# Patient Record
Sex: Female | Born: 1955 | Hispanic: No | Marital: Married | Smoking: Never smoker
Health system: Southern US, Community
[De-identification: ages and names within clinical notes are randomized; demographics above are authoritative.]

## PROBLEM LIST (undated history)

## (undated) DIAGNOSIS — E7800 Pure hypercholesterolemia, unspecified: Secondary | ICD-10-CM

## (undated) DIAGNOSIS — I10 Essential (primary) hypertension: Secondary | ICD-10-CM

## (undated) DIAGNOSIS — M1711 Unilateral primary osteoarthritis, right knee: Secondary | ICD-10-CM

## (undated) DIAGNOSIS — K5090 Crohn's disease, unspecified, without complications: Secondary | ICD-10-CM

## (undated) DIAGNOSIS — M1710 Unilateral primary osteoarthritis, unspecified knee: Secondary | ICD-10-CM

## (undated) DIAGNOSIS — K219 Gastro-esophageal reflux disease without esophagitis: Secondary | ICD-10-CM

## (undated) DIAGNOSIS — J302 Other seasonal allergic rhinitis: Secondary | ICD-10-CM

## (undated) DIAGNOSIS — Z98811 Dental restoration status: Secondary | ICD-10-CM

## (undated) DIAGNOSIS — Z8719 Personal history of other diseases of the digestive system: Secondary | ICD-10-CM

## (undated) HISTORY — PX: TONSILLECTOMY AND ADENOIDECTOMY: SUR1326

## (undated) HISTORY — PX: APPENDECTOMY: SHX54

## (undated) HISTORY — PX: CHOLECYSTECTOMY: SHX55

## (undated) HISTORY — PX: WISDOM TOOTH EXTRACTION: SHX21

## (undated) HISTORY — PX: JOINT REPLACEMENT: SHX530

## (undated) HISTORY — PX: ESOPHAGOGASTRODUODENOSCOPY (EGD) WITH ESOPHAGEAL DILATION: SHX5812

## (undated) HISTORY — PX: TUBAL LIGATION: SHX77

## (undated) HISTORY — PX: SHOULDER SURGERY: SHX246

## (undated) HISTORY — DX: Essential (primary) hypertension: I10

## (undated) HISTORY — DX: Gastro-esophageal reflux disease without esophagitis: K21.9

## (undated) HISTORY — PX: COLONOSCOPY: SHX174

---

## 2004-08-07 ENCOUNTER — Encounter: Payer: Self-pay | Admitting: Gastroenterology

## 2006-03-13 DIAGNOSIS — K5790 Diverticulosis of intestine, part unspecified, without perforation or abscess without bleeding: Secondary | ICD-10-CM | POA: Insufficient documentation

## 2006-03-13 DIAGNOSIS — J302 Other seasonal allergic rhinitis: Secondary | ICD-10-CM | POA: Insufficient documentation

## 2006-03-13 DIAGNOSIS — K219 Gastro-esophageal reflux disease without esophagitis: Secondary | ICD-10-CM | POA: Insufficient documentation

## 2006-03-13 DIAGNOSIS — N6019 Diffuse cystic mastopathy of unspecified breast: Secondary | ICD-10-CM | POA: Insufficient documentation

## 2008-02-01 ENCOUNTER — Encounter: Payer: Self-pay | Admitting: Gastroenterology

## 2008-02-19 ENCOUNTER — Encounter: Payer: Self-pay | Admitting: Gastroenterology

## 2008-03-15 ENCOUNTER — Encounter: Payer: Self-pay | Admitting: Gastroenterology

## 2009-05-24 ENCOUNTER — Encounter: Payer: Self-pay | Admitting: Gastroenterology

## 2009-10-05 ENCOUNTER — Encounter (INDEPENDENT_AMBULATORY_CARE_PROVIDER_SITE_OTHER): Payer: Self-pay | Admitting: *Deleted

## 2009-11-14 ENCOUNTER — Ambulatory Visit: Payer: Self-pay | Admitting: Gastroenterology

## 2009-11-22 ENCOUNTER — Encounter (INDEPENDENT_AMBULATORY_CARE_PROVIDER_SITE_OTHER): Payer: Self-pay | Admitting: *Deleted

## 2009-11-22 DIAGNOSIS — R1319 Other dysphagia: Secondary | ICD-10-CM | POA: Insufficient documentation

## 2009-12-08 ENCOUNTER — Encounter (INDEPENDENT_AMBULATORY_CARE_PROVIDER_SITE_OTHER): Payer: Self-pay

## 2009-12-12 ENCOUNTER — Ambulatory Visit: Payer: Self-pay | Admitting: Gastroenterology

## 2009-12-19 ENCOUNTER — Encounter (INDEPENDENT_AMBULATORY_CARE_PROVIDER_SITE_OTHER): Payer: Self-pay | Admitting: *Deleted

## 2009-12-19 ENCOUNTER — Ambulatory Visit: Payer: Self-pay | Admitting: Gastroenterology

## 2009-12-25 ENCOUNTER — Encounter: Admission: RE | Admit: 2009-12-25 | Discharge: 2010-01-11 | Payer: Self-pay | Admitting: Family Medicine

## 2010-01-23 ENCOUNTER — Ambulatory Visit: Payer: Self-pay | Admitting: Gastroenterology

## 2010-04-02 ENCOUNTER — Encounter: Payer: Self-pay | Admitting: Gastroenterology

## 2010-05-15 NOTE — Letter (Signed)
Summary: [facility]  [facility]   Imported By: Phillis Knack 11/20/2009 14:20:08  _____________________________________________________________________  External Attachment:    Type:   Image     Comment:   External Document

## 2010-05-15 NOTE — Letter (Signed)
Summary: [facility]   Imported By: Phillis Knack 11/24/2009 09:02:35  _____________________________________________________________________  External Attachment:    Type:   Image     Comment:   External Document

## 2010-05-15 NOTE — Letter (Signed)
Summary: Appt Reminder [facility]  [place], [place] [address]   Phone: [phone]  Fax: [phone]        December 19, 2009 MRN: [id number]    [facility] [address] [place], [place]  [address]    Dear Ms. Carnathan,   You have a return appointment with Dr. Ardis Hughs on 01/23/10 at 2:45 pm.  Please remember to bring a complete list of the medicines you are taking, your insurance card and your co-pay.  If you have to cancel or reschedule this appointment, please call before 5:00 pm the evening before to avoid a cancellation fee.  If you have any questions or concerns, please call [phone].    Sincerely,    Christian Mate CMA ([place])  Appended Document: Appt Reminder 2 letter mailed

## 2010-05-15 NOTE — Letter (Signed)
Summary: [facility]   Imported By: Phillis Knack 11/24/2009 08:55:47  _____________________________________________________________________  External Attachment:    Type:   Image     Comment:   External Document

## 2010-05-15 NOTE — Procedures (Signed)
Summary: [facility]   Imported By: Phillis Knack 11/24/2009 08:57:22  _____________________________________________________________________  External Attachment:    Type:   Image     Comment:   External Document

## 2010-05-15 NOTE — Letter (Signed)
Summary: EGD Instructions  [facility]  [place], [place] [address]   Phone: [phone]  Fax: [phone]       Connie Porter    01/29/1956    MRN: [id number]       Procedure Day /Date:  Tuesday 12/19/2009     Arrival Time:  2:00 pm     Procedure Time: 3:00 pm     Location of Procedure:                    _ x _ [address] (4th Floor)    PREPARATION FOR ENDOSCOPY   On Tuesday 9/6  THE DAY OF THE PROCEDURE:  1.   No solid foods, milk or milk products are allowed after midnight the night before your procedure.  2.   Do not drink anything colored red or purple.  Avoid juices with pulp.  No orange juice.  3.  You may drink clear liquids until 1:00 pm, which is 2 hours before your procedure.                                                                                                CLEAR LIQUIDS INCLUDE: Water Jello Ice Popsicles Tea (sugar ok, no milk/cream) Powdered fruit flavored drinks Coffee (sugar ok, no milk/cream) Gatorade Juice: apple, white grape, white cranberry  Lemonade Clear bullion, consomm, broth Carbonated beverages (any kind) Strained chicken noodle soup Hard Candy   MEDICATION INSTRUCTIONS  Unless otherwise instructed, you should take regular prescription medications with a small sip of water as early as possible the morning of your procedure          OTHER INSTRUCTIONS  You will need a responsible adult at least 56 years of age to accompany you and drive you home.   This person must remain in the waiting room during your procedure.  Wear loose fitting clothing that is easily removed.  Leave jewelry and other valuables at home.  However, you may wish to bring a book to read or an iPod/MP3 player to listen to music as you wait for your procedure to start.  Remove all body piercing jewelry and leave at home.  Total time from sign-in until discharge is approximately 2-3 hours.  You should go home  directly after your procedure and rest.  You can resume normal activities the day after your procedure.  The day of your procedure you should not:   Drive   Make legal decisions   Operate machinery   Drink alcohol   Return to work  You will receive specific instructions about eating, activities and medications before you leave.    The above instructions have been reviewed and explained to me by   Cornelia Copa RN  December 12, 2009 3:09 PM     I fully understand and can verbalize these instructions _____________________________ Date _________

## 2010-05-15 NOTE — Miscellaneous (Signed)
Summary: Lec previsit  Clinical Lists Changes  Observations: Added new observation of NKA: T (12/12/2009 14:19)

## 2010-05-15 NOTE — Assessment & Plan Note (Signed)
Review of gastrointestinal problems: 1.Crohn's ileocolitis:  Workup at [facility] testing 02/2008 "pattern consistent with IBD.Marland KitchenMarland KitchenCrohn's." Colonoscopy 01/2008 showed terminal ileum and right colon ulcerations, pathology c/w acute and chronic ileitis, colitis, also HP polyps.  Colonoscopy Path report from 2006 showed HP polyps.  Office note 05/2009 documented previous C. diff colitis (treated empirically and also after toxin +).    2. Dysphasia: EGD August, 2011 found Schatzki's ring that was dilated to 20 mm, small hiatal hernia, tortuous esophagus.    History of Present Illness Visit Type: Follow-up Visit Primary GI MD: Owens Loffler MD Primary Provider: Jae Dire. Modena Morrow, MD  Requesting Provider: Jae Dire. Modena Morrow, MD  Chief Complaint: Crohn's History of Present Illness:     very pleasant 55 year old woman whom I last saw about 2 months ago. She has been doing very well from UGI perspective since starting prilosec.    crohn's can cause abd pains and also loose stools at times.  She will have 3 loose stools a day, never solid.  Sometime has burning at her anus.  Normal Bowels for her is once a day only. Sometimes more that 3  adya,  stools are almost alwasy in the AM.  her weight fluctuates.  At its worst, at beginning of crohn's she had nearly non-stop diarrhea.  Since starting the asacol she has not had any severe, terrible nights.           Current Medications (verified): 1)  Singulair 10 Mg Tabs (Montelukast Sodium) .Marland Kitchen.. 1 By Mouth Once Daily 2)  Lisinopril-Hydrochlorothiazide 10-12.5 Mg Tabs (Lisinopril-Hydrochlorothiazide) .Marland Kitchen.. 1 By Mouth Once Daily 3)  Fexofenadine Hcl 60 Mg Tabs (Fexofenadine Hcl) .Marland Kitchen.. 1 By Mouth Two Times A Day 4)  Veramyst 27.5 Mcg/spray Susp (Fluticasone Furoate) .... 2 Sprays Each Nostril Once Daily 5)  Asacol 400 Mg Tbec (Mesalamine) .Marland Kitchen.. 1200 Mg By Mouth Two Times A Day 6)  Fish Oil 1200 Mg Caps (Omega-3 Fatty Acids) .Marland Kitchen.. 1 By  Mouth Two Times A Day 7)  Prilosec 20 Mg Cpdr (Omeprazole) .Marland Kitchen.. 1 By Mouth Once Daily 8)  Daily Combo Multi Vitamins  Tabs (Multiple Vitamins-Minerals) .Marland Kitchen.. 1 By Mouth Once Daily 9)  Caltrate 600+d Plus 600-400 Mg-Unit Tabs (Calcium Carbonate-Vit D-Min) .Marland Kitchen.. 1 By Mouth Two Times A Day 10)  Ra Soluble Fiber 500 Mg Tabs (Methylcellulose (Laxative)) .... 2 By Mouth Once Daily 11)  Xyzal 5 Mg Tabs (Levocetirizine Dihydrochloride) .... One Tablet By Mouth Once Daily 12)  [place] (Probiotic Product) .... One Tablet By Mouth Once Daily  Allergies (verified): No Known Drug Allergies  Vital Signs:  Patient profile:   55 year old female Height:      67 inches Weight:      267.50 pounds BMI:     42.05 Pulse rate:   64 / minute Pulse rhythm:   regular BP sitting:   102 / 66  (left arm) Cuff size:   large  Vitals Entered By: June McMurray Erin Deborra Medina) (January 23, 2010 2:36 PM)  Physical Exam  Additional Exam:  Constitutional: generally well appearing Psychiatric: alert and oriented times 3 Abdomen: soft, non-tender, non-distended, normal bowel sounds    Impression & Recommendations:  Problem # 1:  GERD, dysphasia improved on proton pump inhibitor and dilation. She will stay on Prilosec, but she will decrease to every other day as a trial.  Problem # 2:  Crohn's ileocolitis difficult to tell if she has ongoing active disease. She is not  very bothered by her symptoms. She does have 3-4 loose stools in the morning and I recommended we increase her mesalamine to 3 pills, 3 times a day and she'll return to see me in 3 months.  Patient Instructions: 1)  will increase asacol to three pills three times a day 2)  Return to see Dr. Ardis Hughs in 3 months. 3)  Try cutting back the omeprazole to every other day. 4)  A copy of this information will be sent to Dr. [name]. 5)  The medication list was reviewed and reconciled.  All changed / newly prescribed medications were  explained.  A complete medication list was provided to the patient / caregiver. Prescriptions: ASACOL 400 MG TBEC (MESALAMINE) take 3 pills, three times a day  #270 x 33   Entered and Authorized by:   Milus Banister MD   Signed by:   Milus Banister MD on 01/23/2010   Method used:   Electronically to        [facility]. [address] [phone]* (retail)       [address]       [place], [place]  [address]       Ph: [phone] or [phone]       Fax: [phone]   RxID:   [id number]

## 2010-05-15 NOTE — Letter (Signed)
Summary: Previsit letter  [facility]  [place], [place] [address]   Phone: [phone]  Fax: [phone]       11/22/2009 MRN: [id number]  Connie Porter [place], [place]  [address]  Dear [name],  Welcome to the Gastroenterology Division at [facility].    You are scheduled to see a nurse for your pre-procedure visit on 12/12/09 at 2:30pm on the 3rd floor at [facility] [place] [employer].  We ask that you try to arrive at our office 15 minutes prior to your appointment time to allow for check-in.  Your nurse visit will consist of discussing your medical and surgical history, your immediate family medical history, and your medications.    Please bring a complete list of all your medications or, if you prefer, bring the medication bottles and we will list them.  We will need to be aware of both prescribed and over the counter drugs.  We will need to know exact dosage information as well.  If you are on blood thinners (Coumadin, Plavix, Aggrenox, Ticlid, etc.) please call our office today/prior to your appointment, as we need to consult with your physician about holding your medication.   Please be prepared to read and sign documents such as consent forms, a financial agreement, and acknowledgement forms.  If necessary, and with your consent, a friend or relative is welcome to sit-in on the nurse visit with you.  Please bring your insurance card so that we may make a copy of it.  If your insurance requires a referral to see a specialist, please bring your referral form from your primary care physician.  No co-pay is required for this nurse visit.     If you cannot keep your appointment, please call [phone] to cancel or reschedule prior to your appointment date.  This allows [place] the opportunity to schedule an appointment for another patient in need of care.    Thank you for choosing [facility] for your medical  needs.  We appreciate the opportunity to care for you.  Please visit [place] at our website  to learn more about our practice.                     Sincerely.                                                                                                                   The Gastroenterology Division  Appended Document: Previsit letter letter mailed

## 2010-05-15 NOTE — Procedures (Signed)
Summary: Upper Endoscopy  Patient: Connie Porter Note: All result statuses are Final unless otherwise noted.  Tests: (1) Upper Endoscopy (EGD)   EGD Upper Endoscopy       [facility].     [place], [place]  [address]           ENDOSCOPY PROCEDURE REPORT           PATIENT:  Connie Porter, Connie Porter  MR#:  [id number]     BIRTHDATE:  06/09/1955, 26 yrs. old  GENDER:  female           ENDOSCOPIST:  Milus Banister, MD     Referred by:  [name], M.D.           PROCEDURE DATE:  12/19/2009     PROCEDURE:  EGD with balloon dilatation     ASA CLASS:  Class II     INDICATIONS:  intermittent dysphagia, chronic GERD           MEDICATIONS:  Fentanyl 75 mcg IV, Versed 6 mg IV     TOPICAL ANESTHETIC:  Exactacain Spray           DESCRIPTION OF PROCEDURE:   After the risks benefits and     alternatives of the procedure were thoroughly explained, informed     consent was obtained.  The LB GIF-H180 [id number] endoscope was     introduced through the mouth and advanced to the second portion of     the duodenum, without limitations.  The instrument was slowly     withdrawn as the mucosa was fully examined.     <<PROCEDUREIMAGES>>           There was a Schatzki's ring above a 3-4cm hiatal hernia. The ring     was dilated using a 29m CRE TTS balloon inflated for 60 seconds     with the usual resultant minor mucosal tear and self limited     oozing of blood. The distal esophagus was also quite tortuous (see     image1, image6, image5, image7, and image9).  Otherwise the     examination was normal (see image4, image3, and image2).     Retroflexed views revealed no abnormalities.    The scope was then     withdrawn from the patient and the procedure completed.           COMPLICATIONS:  None           ENDOSCOPIC IMPRESSION:     1) Schatzki's ring, dilated to 250mwith CRE balloon     2) Tortuous distal esophagus     3) 3-4cm hiatal hernia     2) Otherwise normal  examination; dysphagia probably     multifactorial (ring, tortuous esophagus, hiatal hernia and GERD).           RECOMMENDATIONS:     Continue once daily PPI.     Dr. JaArdis Hughsoffice will call to arrange follow up visit in 4-5     weeks to discuss Crohn's disease, consider further treatment.           ______________________________     DaMilus BanisterMD           n.     eSIGNED:   DaMilus Banistert 12/19/2009 11:37 AM           HoOkey [id number]: An exclamation mark (!) indicates a result  that was not dispersed into the flowsheet. Document Creation Date: 12/19/2009 11:38 AM _______________________________________________________________________  (1) Order result status: Final Collection or observation date-time: 12/19/2009 11:28 Requested date-time:  Receipt date-time:  Reported date-time:  Referring Physician:   Ordering Physician: Owens Loffler [phone]) Specimen Source:  Source: Tawanna Cooler Order Number: [id number] Lab site:   Appended Document: Upper Endoscopy ROV scheduled letter mailed

## 2010-05-15 NOTE — Letter (Signed)
Summary: New Patient letter  [facility]  [address] [place], [place] [address]   Phone: [phone]  Fax: [phone]       10/05/2009 MRN: [id number]  Connie Porter [place],   [address]  Dear Ms. Torrance,  Welcome to the Gastroenterology Division at [facility].    You are scheduled to see Dr.  Ardis Hughs on 11-14-09 at 9:00a.m. on the 3rd floor at [facility] [place] [employer].  We ask that you try to arrive at our office 15 minutes prior to your appointment time to allow for check-in.  We would like you to complete the enclosed self-administered evaluation form prior to your visit and bring it with you on the day of your appointment.  We will review it with you.  Also, please bring a complete list of all your medications or, if you prefer, bring the medication bottles and we will list them.  Please bring your insurance card so that we may make a copy of it.  If your insurance requires a referral to see a specialist, please bring your referral form from your primary care physician.  Co-payments are due at the time of your visit and may be paid by cash, check or credit card.     Your office visit will consist of a consult with your physician (includes a physical exam), any laboratory testing he/she may order, scheduling of any necessary diagnostic testing (e.g. x-ray, ultrasound, CT-scan), and scheduling of a procedure (e.g. Endoscopy, Colonoscopy) if required.  Please allow enough time on your schedule to allow for any/all of these possibilities.    If you cannot keep your appointment, please call [phone] to cancel or reschedule prior to your appointment date.  This allows [place] the opportunity to schedule an appointment for another patient in need of care.  If you do not cancel or reschedule by 5 p.m. the business day prior to your appointment date, you will be charged a $50.00 late cancellation/no-show fee.    Thank you for choosing  [facility] for your medical needs.  We appreciate the opportunity to care for you.  Please visit [place] at our website  to learn more about our practice.                     Sincerely,                                                             The Gastroenterology Division

## 2010-05-15 NOTE — Assessment & Plan Note (Signed)
History of Present Illness Visit Type: consult  Primary GI MD: Owens Loffler MD Primary Provider: Tammy R. Modena Morrow, MD  Requesting Provider: Jae Dire. Modena Morrow, MD  Chief Complaint: Crohn's History of Present Illness:     very pleasant 55 year old woman who moved from [place] a few months ago.  Used to see a GI MD regularly.  She has crohn's disease...she was having severe intermittent abd pain, vomiting.  Eventually had colonoscopy, several other tests (SBFT and labs).  All this pointed towards crohn's disease. She has been on asacol, 6 pills a day, for at least a year.  This has helped, she has not had any more "attacks" of pain/vomiting/diarrhea.  She would have these a bout 5-6 times a year...lasting about a day.  Between the attacks she would have continued bilateral lower abd pains, but no diarrhea.  Normally has 2 soft BMs a day.  She had colon polyps removed in the past, "pre-cancerous".  Her last colonoscopy was about a year ago.  also recently odynophagia, dysphagia.  She had testing in [place] about this, was told a hiatal hernia and that was the cause of her problems.  Lately her symptoms are about 2 times a week...has to either vomit or wait through the.  She never had an EGD.  Overall stable weight. She has no overt hematemesis.  She takes prilosec rarely, with pyrosis only.  About 1 every 2-3 weeks.  Tums about once a month.           Current Medications (verified): 1)  Singulair 10 Mg Tabs (Montelukast Sodium) .Marland Kitchen.. 1 By Mouth Once Daily 2)  Lisinopril-Hydrochlorothiazide 10-12.5 Mg Tabs (Lisinopril-Hydrochlorothiazide) .Marland Kitchen.. 1 By Mouth Once Daily 3)  Fexofenadine Hcl 60 Mg Tabs (Fexofenadine Hcl) .Marland Kitchen.. 1 By Mouth Two Times A Day 4)  Veramyst 27.5 Mcg/spray Susp (Fluticasone Furoate) .... 2 Sprays Each Nostril Once Daily 5)  Asacol 400 Mg Tbec (Mesalamine) .Marland Kitchen.. 1200 Mg By Mouth Two Times A Day 6)  Fish Oil 1200 Mg Caps (Omega-3 Fatty Acids) .Marland Kitchen.. 1 By Mouth Two Times A  Day 7)  Prilosec 20 Mg Cpdr (Omeprazole) .Marland Kitchen.. 1 By Mouth Once Daily 8)  Daily Combo Multi Vitamins  Tabs (Multiple Vitamins-Minerals) .Marland Kitchen.. 1 By Mouth Once Daily 9)  Caltrate 600+d Plus 600-400 Mg-Unit Tabs (Calcium Carbonate-Vit D-Min) .Marland Kitchen.. 1 By Mouth Two Times A Day 10)  Ra Soluble Fiber 500 Mg Tabs (Methylcellulose (Laxative)) .... 2 By Mouth Once Daily 11)  Xyzal 5 Mg Tabs (Levocetirizine Dihydrochloride) .... One Tablet By Mouth Once Daily 12)  [place] (Probiotic Product) .... 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  All other review of systems was otherwise negative.   Vital Signs:  Patient profile:   55 year old female Height:      67 inches Weight:  272 pounds BMI:     42.76 BSA:     2.31 Pulse rate:   96 / minute Pulse rhythm:   regular BP sitting:   128 / 76  (left arm) Cuff size:   regular  Vitals Entered By: Hope Pigeon CMA (November 14, 2009 9:00 AM)  Physical Exam  Additional Exam:  Constitutional: generally well appearing Psychiatric: alert and oriented times 3 Eyes: extraocular movements intact Mouth: oropharynx moist, no lesions Neck: supple, no lymphadenopathy Cardiovascular: heart regular rate and rythm Lungs: CTA bilaterally Abdomen: soft, non-tender, non-distended, no obvious  ascites, no peritoneal signs, normal bowel sounds Extremities: no lower extremity edema bilaterally Skin: no lesions on visible extremities    Impression & Recommendations:  Problem # 1:  History of Crohn's disease we will get records sent over from her previous gastroenterologist. Her clinical presentation was a bit unusual for classic Crohn's disease and so I am certainly interested to see her colonoscopy reports, small bowel follow-through records, serologic testing if any was done. She may need a repeat colonoscopy. She was told she would need one every one year however that is a very unusual recommendation and I would not schedule her for a repeat colonoscopy and so I can review her previous records to decipher myself whether she needs one.  I have called her in a new prescription for asacol, at her previous dosing.  Problem # 2:  dysphasia possibly GERD related. I have recommended she increase her proton pump inhibitor so that she takes it every day instead of on an as-needed basis only. She will need an EGD since she has not had one before and is having this dysphasia symptom. I would like to wait until and decide whether she also needs a colonoscopy so that those 2 tests can be done in the same setting.  Patient Instructions: 1)  We will get records from previous GI MD in [place] regarding your history of crohn's. 2)  Stay on the asacol for now, a new script was called in to pharmacy today. 3)  Start taking prilosec every day for now.  Best 20-30 min  before BF meal. 4)  You will need EGD, but may also need a colonoscopy (depending on review of previous records). We will wait these until after the records are review. 5)  A copy of this information will be sent to Dr. Cannon Kettle. 6)  The medication list was reviewed and reconciled.  All changed / newly prescribed medications were explained.  A complete medication list was provided to the patient / caregiver. Prescriptions: ASACOL 400  MG TBEC (MESALAMINE) 1200 mg by mouth two times a day  #540 x 3   Entered and Authorized by:   Milus Banister MD   Signed by:   Milus Banister MD on 11/14/2009   Method used:   Electronically to        [facility]. [address] [phone]* (retail)       [address]       [place], [place]  [address]       Ph: [phone] or [phone]       Fax: [phone]   RxID:   [id number]   Appended Document:  recieved very helpful packet of records fromprevious GI in [place].  [facility] IBD Serology 7 testing 02/2008 "pattern consistent with IBD.Marland KitchenMarland KitchenCrohn's." Colonoscopy 01/2008 showed terminal ileum and right colon ulcerations, pathology c/w acute and chronic  ileitis, colitis, also HP polyps.  Colonoscopy Path report from 2006 showed HP polyps.  Office note 05/2009 documented previous C. diff colitis (treated empirically and also after toxin +).  patty, please call patient, no need for colonoscopy at this point, but she does need EGD for the dysphagia, odynophagia  Appended Document: problems update pt aware egd and previsit scheduled   Clinical Lists Changes  Problems: Added new problem of OTHER DYSPHAGIA (HSV-074.60)

## 2010-05-17 NOTE — Letter (Signed)
Summary: Office Visit Letter  [facility]  [address] [place], [place] [address]   Phone: [phone]  Fax: [phone]      April 02, 2010 MRN: [id number]   [facility] [address] [place], [place]  [address]   Dear Ms. Sibley,   According to our records, it is time for you to schedule a follow-up office visit with [place].   At your convenience, please call [phone] (option #2)to schedule an office visit. If you have any questions, concerns, or feel that this letter is in error, we would appreciate your call.   Sincerely,  Milus Banister, M.D.  [facility] [phone]

## 2010-06-18 ENCOUNTER — Encounter: Payer: Self-pay | Admitting: Gastroenterology

## 2010-06-18 ENCOUNTER — Ambulatory Visit (INDEPENDENT_AMBULATORY_CARE_PROVIDER_SITE_OTHER): Payer: BC Managed Care – PPO | Admitting: Gastroenterology

## 2010-06-18 DIAGNOSIS — K5090 Crohn's disease, unspecified, without complications: Secondary | ICD-10-CM

## 2010-06-26 NOTE — Assessment & Plan Note (Signed)
  Review of gastrointestinal problems: 1.Crohn's ileocolitis:  Workup at [facility] IBD Serology 7 testing 02/2008 "pattern consistent with IBD.Marland KitchenMarland KitchenCrohn's." Colonoscopy 01/2008 showed terminal ileum and right colon ulcerations, pathology c/w acute and chronic ileitis, colitis, also HP polyps.  Colonoscopy Path report from 2006 showed HP polyps.  Office note 05/2009 documented previous C. diff colitis (treated empirically and also after toxin +).    2. Dysphasia: EGD August, 2011 found Schatzki's ring that was dilated to 20 mm, small hiatal hernia, tortuous esophagus.  Swallowing much improved.  March 2012 PPI only every other day.    Vital Signs:  Patient profile:   55 year old female Height:      67 inches Weight:      277.38 pounds BMI:     43.60 Pulse rate:   84 / minute Pulse rhythm:   regular BP sitting:   100 / 70  (left arm) Cuff size:   large  Vitals Entered By: June McMurray Starbuck Deborra Medina) (June 18, 2010 3:39 PM)  History of Present Illness Visit Type: Follow-up Visit Primary GI MD: Owens Loffler MD Primary Provider: Jae Dire. Modena Morrow, MD  Requesting Provider: Jae Dire. Modena Morrow, MD  Chief Complaint: Crohn's History of Present Illness:     she increased asacol up to 3.6 grams a day total.  Usually has BM, loose 2 of them in AM.  Rarely 3-4 loose stools, never nocturnal.  This is clearly better for her.  Asacol has helped.    she is very happy with her bowels now.          Current Medications (verified): 1)  Singulair 10 Mg Tabs (Montelukast Sodium) .Marland Kitchen.. 1 By Mouth Once Daily 2)  Lisinopril-Hydrochlorothiazide 10-12.5 Mg Tabs (Lisinopril-Hydrochlorothiazide) .Marland Kitchen.. 1 By Mouth Once Daily 3)  Fexofenadine Hcl 60 Mg Tabs (Fexofenadine Hcl) .Marland Kitchen.. 1 By Mouth Two Times A Day 4)  Veramyst 27.5 Mcg/spray Susp (Fluticasone Furoate) .... 2 Sprays Each Nostril Once Daily 5)  Asacol 400 Mg Tbec (Mesalamine) .... Take 3 Pills, Three Times A Day 6)  Fish Oil 1200 Mg Caps (Omega-3  Fatty Acids) .Marland Kitchen.. 1 By Mouth Two Times A Day 7)  Prilosec 20 Mg Cpdr (Omeprazole) .Marland Kitchen.. 1 By Mouth Once Daily 8)  Daily Combo Multi Vitamins  Tabs (Multiple Vitamins-Minerals) .Marland Kitchen.. 1 By Mouth Once Daily 9)  Caltrate 600+d Plus 600-400 Mg-Unit Tabs (Calcium Carbonate-Vit D-Min) .Marland Kitchen.. 1 By Mouth Two Times A Day 10)  Ra Soluble Fiber 500 Mg Tabs (Methylcellulose (Laxative)) .... 2 By Mouth Once Daily 11)  Xyzal 5 Mg Tabs (Levocetirizine Dihydrochloride) .... One Tablet By Mouth Once Daily 12)  Tom Green (Probiotic Product) .... One Tablet By Mouth Once Daily  Allergies (verified): No Known Drug Allergies  Physical Exam  Additional Exam:  Constitutional: generally well appearing,  obese Psychiatric: alert and oriented times 3 Abdomen: soft, non-tender, non-distended, normal bowel sounds    Impression & Recommendations:  Problem # 1:   Crohn's ileocolitis  she is under good control on mesalamine , 3.6 g daily. She will continue this indefinitely. She'll return to see me in 12 months and sooner if needed.  Patient Instructions: 1)  Stay on 3 pills, three times a day asacol. 2)  Return to see Dr. Ardis Hughs in 12 months, sooner if needed. 3)  The medication list was reviewed and reconciled.  All changed / newly prescribed medications were explained.  A complete medication list was provided to the patient / caregiver.

## 2011-02-11 ENCOUNTER — Other Ambulatory Visit: Payer: Self-pay | Admitting: Gastroenterology

## 2011-05-26 ENCOUNTER — Encounter (HOSPITAL_COMMUNITY): Payer: Self-pay

## 2011-05-26 ENCOUNTER — Emergency Department (HOSPITAL_COMMUNITY): Payer: BC Managed Care – PPO

## 2011-05-26 ENCOUNTER — Observation Stay (HOSPITAL_COMMUNITY)
Admission: EM | Admit: 2011-05-26 | Discharge: 2011-05-27 | Disposition: A | Discharge status: Home / Self Care | Payer: BC Managed Care – PPO | Attending: Emergency Medicine | Admitting: Emergency Medicine

## 2011-05-26 ENCOUNTER — Other Ambulatory Visit: Payer: Self-pay

## 2011-05-26 DIAGNOSIS — I10 Essential (primary) hypertension: Secondary | ICD-10-CM | POA: Insufficient documentation

## 2011-05-26 DIAGNOSIS — R079 Chest pain, unspecified: Principal | ICD-10-CM | POA: Insufficient documentation

## 2011-05-26 LAB — URINALYSIS, ROUTINE W REFLEX MICROSCOPIC
Glucose, UA: NEGATIVE mg/dL
Ketones, ur: NEGATIVE mg/dL
Protein, ur: NEGATIVE mg/dL

## 2011-05-26 LAB — CARDIAC PANEL(CRET KIN+CKTOT+MB+TROPI): CK, MB: 1.6 ng/mL (ref 0.3–4.0)

## 2011-05-26 LAB — DIFFERENTIAL
Lymphocytes Relative: 20 % (ref 12–46)
Lymphs Abs: 1.3 10*3/uL (ref 0.7–4.0)
Monocytes Relative: 6 % (ref 3–12)
Neutro Abs: 4.8 10*3/uL (ref 1.7–7.7)
Neutrophils Relative %: 71 % (ref 43–77)

## 2011-05-26 LAB — PROTIME-INR
INR: 0.91 (ref 0.00–1.49)
Prothrombin Time: 12.5 seconds (ref 11.6–15.2)

## 2011-05-26 LAB — CBC
HCT: 39.7 % (ref 36.0–46.0)
Hemoglobin: 12.5 g/dL (ref 12.0–15.0)
RBC: 4.31 MIL/uL (ref 3.87–5.11)

## 2011-05-26 LAB — COMPREHENSIVE METABOLIC PANEL
Albumin: 3.7 g/dL (ref 3.5–5.2)
Alkaline Phosphatase: 75 U/L (ref 39–117)
BUN: 18 mg/dL (ref 6–23)
CO2: 26 mEq/L (ref 19–32)
Chloride: 102 mEq/L (ref 96–112)
GFR calc Af Amer: 73 mL/min — ABNORMAL LOW (ref >90)
GFR calc non Af Amer: 63 mL/min — ABNORMAL LOW (ref >90)
Glucose, Bld: 88 mg/dL (ref 70–99)
Potassium: 4 mEq/L (ref 3.5–5.1)
Total Bilirubin: 0.2 mg/dL — ABNORMAL LOW (ref 0.3–1.2)

## 2011-05-26 LAB — URINE MICROSCOPIC-ADD ON

## 2011-05-26 MED ORDER — NITROGLYCERIN 0.4 MG SL SUBL
0.4000 mg | SUBLINGUAL_TABLET | SUBLINGUAL | Status: DC | PRN
  Administered 2011-05-26 (×2): 0.4 mg via SUBLINGUAL

## 2011-05-26 MED ORDER — SODIUM CHLORIDE 0.9 % IV SOLN
Freq: Once | INTRAVENOUS | Status: AC
  Administered 2011-05-26: 18:00:00 via INTRAVENOUS

## 2011-05-26 MED ORDER — SODIUM CHLORIDE 0.9 % IV SOLN: INTRAVENOUS | Status: DC

## 2011-05-26 MED ORDER — ONDANSETRON HCL 4 MG/2ML IJ SOLN
4.0000 mg | Freq: Four times a day (QID) | INTRAMUSCULAR | Status: DC
  Administered 2011-05-26: 4 mg via INTRAVENOUS
  Filled 2011-05-26: qty 2

## 2011-05-26 MED ORDER — MORPHINE SULFATE 4 MG/ML IJ SOLN
4.0000 mg | INTRAMUSCULAR | Status: DC | PRN
  Administered 2011-05-26 (×2): 4 mg via INTRAVENOUS
  Filled 2011-05-26 (×2): qty 1

## 2011-05-26 MED ORDER — ASPIRIN 81 MG PO CHEW
324.0000 mg | CHEWABLE_TABLET | Freq: Once | ORAL | Status: AC
  Administered 2011-05-26: 324 mg via ORAL
  Filled 2011-05-26: qty 4

## 2011-05-26 NOTE — ED Notes (Signed)
No relief of chest pain after receiving (2) sublingual nitroglycerin. Will continue to monitor. No signs of distress at the time.

## 2011-05-26 NOTE — ED Provider Notes (Cosign Needed)
History     CSN: [id number]  Arrival date & time 05/26/11  1345   First MD Initiated Contact with Patient 05/26/11 1459      Chief Complaint  Patient presents with  . Chest Pain    (Consider location/radiation/quality/duration/timing/severity/associated sxs/prior treatment) HPI Comments: The patient is a 56 year old woman who complains of chest pain for the past couple of days. The pains come and go. She feels the pain in her left anterior chest. There is no radiation. She thought it was heartburn, and tried TUMS without relief. She is on Prilosec, as prescribed by her gastroenterologist, Dr. Ardis Hughs. The pain was worse this morning, and she therefore sought evaluation. She has a family history of heart disease, with her  father dying of an MI, one brother with coronary artery stents, and. One brother with a heart transplant.  Patient is a 56 y.o. female presenting with chest pain.  Chest Pain The chest pain began 2 days ago. Chest pain occurs intermittently. The chest pain is unchanged. Associated with: Nothing. At its most intense, the pain is at 8/10. The pain is currently at 6/10. The quality of the pain is described as pressure-like. The pain does not radiate. Exacerbated by: Nothing. Pertinent negatives for primary symptoms include no fever and no fatigue. She tried antacids and proton pump inhibitors for the symptoms. Risk factors include lack of exercise and obesity. Past medical history comments: Crohn's disease  Her family medical history is significant for CAD in family.     Past Medical History  Diagnosis Date  . Hypertension     History reviewed. No pertinent past surgical history.  History reviewed. No pertinent family history.  History  Substance Use Topics  . Smoking status: Never Smoker   . Smokeless tobacco: Not on file  . Alcohol Use: Yes    OB History    Grav Para Term Preterm Abortions TAB SAB Ect Mult Living                  Review of Systems    Constitutional: Negative.  Negative for fever, chills and fatigue.  HENT: Negative.   Eyes: Negative.   Respiratory: Negative.   Cardiovascular: Positive for chest pain.  Gastrointestinal: Negative.   Genitourinary: Negative.   Musculoskeletal: Negative.   Neurological: Negative.   Psychiatric/Behavioral: Negative.     Allergies  Review of patient's allergies indicates no known allergies.  Home Medications   Current Outpatient Rx  Name Route Sig Dispense Refill  . CALCIUM CITRATE PO Oral Take 1 tablet by mouth 2 (two) times daily.    Marland Kitchen VITAMIN D-3 PO Oral Take 1 capsule by mouth at bedtime.    Marland Kitchen VITAMIN B-12 PO Oral Take 1 tablet by mouth daily.    Marland Kitchen FLUTICASONE FUROATE 27.5 MCG/SPRAY NA SUSP Nasal Place 2 sprays into the nose daily as needed. For congestion    . IRON PO Oral Take 1 tablet by mouth daily.    Marland Kitchen LEVOCETIRIZINE DIHYDROCHLORIDE 5 MG PO TABS Oral Take 5 mg by mouth every evening.    Marland Kitchen LISINOPRIL-HYDROCHLOROTHIAZIDE 10-12.5 MG PO TABS Oral Take 1 tablet by mouth every morning.    Marland Kitchen MESALAMINE 400 MG PO TBEC Oral Take 1,200 mg by mouth 3 (three) times daily.    Marland Kitchen MONTELUKAST SODIUM 10 MG PO TABS Oral Take 10 mg by mouth at bedtime.    . ADULT MULTIVITAMIN W/MINERALS CH Oral Take 1 tablet by mouth 2 (two) times daily.    Marland Kitchen  PATANASE NA Nasal Place 1 spray into the nose daily as needed. For congestion.    Marland Kitchen FISH OIL PO Oral Take 1 capsule by mouth 2 (two) times daily.      BP 118/60  Pulse 80  Temp(Src) 98.6 F (37 C) (Oral)  Resp 18  SpO2 95%  Physical Exam  Nursing note and vitals reviewed. Constitutional: She is oriented to person, place, and time.       Obese middle-aged woman in mild distress with chest pain.  HENT:  Head: Normocephalic and atraumatic.  Right Ear: External ear normal.  Left Ear: External ear normal.  Mouth/Throat: Oropharynx is clear and moist.  Eyes: Conjunctivae and EOM are normal. Pupils are equal, round, and reactive to light.   Neck: Normal range of motion. Neck supple.  Cardiovascular: Normal rate, regular rhythm and normal heart sounds.   Pulmonary/Chest: Effort normal and breath sounds normal.  Abdominal: Soft. Bowel sounds are normal.  Musculoskeletal: Normal range of motion.  Neurological: She is alert and oriented to person, place, and time.       Sensory or motor deficit.  Skin: Skin is warm and dry.  Psychiatric: She has a normal mood and affect. Her behavior is normal.    ED Course  Procedures (including critical care time)  Labs Reviewed - No data to display No results found.  3:03 PM  Date: 05/26/2011   Rate:74  Rhythm: normal sinus rhythm  QRS Axis: left  Intervals: normal QRS:  PoorR wave progression in precordial leads suggests possible old anterior myocardial infarction.  ST/T Wave abnormalities: normal  Conduction Disutrbances:none  Narrative Interpretation: Abnormal EKG  Old EKG Reviewed: none available  3:22 PM Patient was seen and had physical examination. EKG was nonacute. Laboratory tests were ordered. Aspirin and nitroglycerin were ordered.   5:09 PM Lab workup was negative.  She still has some pain.  I advised her that she needed further evaluation.  She has a heart score of 3 by my calculation.  Will place in CDU on chest pain protocol.   1. Chest pain     Admit to CDU on chest pain protocol.       Mylinda Latina III, MD 05/26/11 [phone]

## 2011-05-26 NOTE — ED Notes (Signed)
Pt complains of chest pain, onset several days ago, thought was indegestion but no help with meds for same, this am was ok and then increased once pateitn was up and moving around today.

## 2011-05-26 NOTE — ED Notes (Signed)
Pt states 4/10 L sided chest pain. Hypotensive at the time. Remains on cardiac monitor. No signs of distress at the present.

## 2011-05-26 NOTE — ED Notes (Signed)
Pt continues to have 4/10 L sided chest pain. CDU notified. Pt medicated. Will continue to monitor.

## 2011-05-26 NOTE — ED Notes (Addendum)
Pt presents to department for evaluation of non radiating L sided chest pain. Ongoing for several days, states pain became worse last night. Nothing makes pain better. States pain 5/10 at the time, becomes worse with movement. Describes pain as constant pressure. Lung sounds clear and equal bilaterally. Respirations unlabored. Skin warm and dry. She is alert and oriented x4. No signs of distress at the time.

## 2011-05-26 NOTE — ED Notes (Signed)
Pt states 5/10 L sided chest pain. Remains on cardiac monitor. Vital signs stable. EDP at bedside to update on plan of care. Pt is conscious alert and oriented x4. No signs of distress at the time.

## 2011-05-27 ENCOUNTER — Telehealth (HOSPITAL_COMMUNITY): Payer: Self-pay

## 2011-05-27 ENCOUNTER — Other Ambulatory Visit: Payer: Self-pay

## 2011-05-27 ENCOUNTER — Other Ambulatory Visit (HOSPITAL_COMMUNITY): Payer: Self-pay | Admitting: Cardiovascular Disease

## 2011-05-27 ENCOUNTER — Observation Stay (HOSPITAL_BASED_OUTPATIENT_CLINIC_OR_DEPARTMENT_OTHER): Payer: BC Managed Care – PPO | Admitting: Radiology

## 2011-05-27 VITALS — Ht 67.0 in | Wt 252.0 lb

## 2011-05-27 DIAGNOSIS — R079 Chest pain, unspecified: Secondary | ICD-10-CM

## 2011-05-27 DIAGNOSIS — R0602 Shortness of breath: Secondary | ICD-10-CM

## 2011-05-27 LAB — POCT I-STAT TROPONIN I: Troponin i, poc: 0 ng/mL (ref 0.00–0.08)

## 2011-05-27 LAB — CARDIAC PANEL(CRET KIN+CKTOT+MB+TROPI)
CK, MB: 1.6 ng/mL (ref 0.3–4.0)
Relative Index: INVALID (ref 0.0–2.5)
Total CK: 32 U/L (ref 7–177)

## 2011-05-27 LAB — URINE CULTURE: Colony Count: 15000

## 2011-05-27 MED ORDER — MESALAMINE 400 MG PO TBEC
1200.0000 mg | DELAYED_RELEASE_TABLET | ORAL | Status: AC
  Administered 2011-05-27: 1200 mg via ORAL
  Filled 2011-05-27: qty 3

## 2011-05-27 MED ORDER — METOPROLOL TARTRATE 25 MG PO TABS
50.0000 mg | ORAL_TABLET | Freq: Once | ORAL | Status: AC
  Administered 2011-05-27: 50 mg via ORAL
  Filled 2011-05-27: qty 2

## 2011-05-27 MED ORDER — TECHNETIUM TC 99M TETROFOSMIN IV KIT
33.0000 | PACK | Freq: Once | INTRAVENOUS | Status: AC | PRN
  Administered 2011-05-27: 33 via INTRAVENOUS

## 2011-05-27 MED ORDER — REGADENOSON 0.4 MG/5ML IV SOLN
0.4000 mg | Freq: Once | INTRAVENOUS | Status: AC
  Administered 2011-05-27: 0.4 mg via INTRAVENOUS

## 2011-05-27 NOTE — Consult Note (Signed)
CARDIOLOGY CONSULT NOTE  Patient ID: Connie Porter MRN: [id number] DOB/AGE: [birth year] 57 y.o.  Admit date: 05/26/2011 Referring Physician:  Leanne Lovely Primary Physician: [name], MD, MD Primary Cardiologist:  New Reason for Consultation: Chest Pain  Active Problems:  * No active hospital problems. *    HPI:   56 yo obese female came to ER yesterday for SSCP  Started at rest sharp.  Somewhat positional.  Lasted hours.  Went to ER when it didn't get better.  In ER R/O normal ECG Was to have cardiac CT and got beta blocked.  However too heavy for study.  Discussed with patient.  Agree with need for stress testing.  Can do myovue possibly two day protocol  In our office today.  CRF;s HTN Rx by Dr Modena Morrow for years.  Mild chronic exertional dyspnea.  Sedentary.  Compliant with meds.  No previous history of cardiac problems  @ROS @ All other systems reviewed and negative except as noted above  Past Medical History  Diagnosis Date  . Hypertension     History reviewed. No pertinent family history.  History   Social History  . Marital Status: Married    Spouse Name: N/A    Number of Children: N/A  . Years of Education: N/A   Occupational History  . Not on file.   Social History Main Topics  . Smoking status: Never Smoker   . Smokeless tobacco: Not on file  . Alcohol Use: Yes  . Drug Use: No  . Sexually Active:    Other Topics Concern  . Not on file   Social History Narrative  . No narrative on file    History reviewed. No pertinent past surgical history.      . sodium chloride   Intravenous Once  . aspirin  324 mg Oral Once  . mesalamine  1,200 mg Oral To Major  . metoprolol tartrate  50 mg Oral Once  . ondansetron (ZOFRAN) IV  4 mg Intravenous Q6H      . sodium chloride      Physical Exam: Blood pressure 93/63, pulse 72, temperature 98 F (36.7 C), temperature source Oral, resp. rate 16, height 5' 7"  (1.702 m), weight 114.845 kg (253 lb 3 oz), SpO2  97.00%.  Affect appropriate Obese white femal HEENT: normal Neck supple with no adenopathy JVP normal no bruits no thyromegaly Lungs clear with no wheezing and good diaphragmatic motion Heart:  S1/S2 no murmur, no rub, gallop or click PMI normal Abdomen: benighn, BS positve, no tenderness, no AAA no bruit.  No HSM or HJR Distal pulses intact with no bruits No edema Neuro non-focal Skin warm and dry No muscular weakness  Labs:   Lab Results  Component Value Date   WBC 6.7 05/26/2011   HGB 12.5 05/26/2011   HCT 39.7 05/26/2011   MCV 92.1 05/26/2011   PLT 330 05/26/2011    Lab 05/26/11 1526  NA 136  K 4.0  CL 102  CO2 26  BUN 18  CREATININE 0.98  CALCIUM 11.0*  PROT 7.7  BILITOT 0.2*  ALKPHOS 75  ALT 14  AST 22  GLUCOSE 88   Lab Results  Component Value Date   CKTOTAL 32 05/27/2011   CKMB 1.6 05/27/2011   TROPONINI <0.30 05/27/2011       Radiology: Dg Chest Port 1 View  05/26/2011  *RADIOLOGY REPORT*  Clinical Data: Chest pain.  PORTABLE CHEST - 1 VIEW  Comparison: None  Findings: The cardiac silhouette, mediastinal and hilar  contours are within normal  limits given the AP projection.  The lungs are clear.  The bony thorax is intact.  IMPRESSION: No acute cardiopulmonary findings.  Original Report Authenticated By: P. Kalman Jewels, M.D.    EKG: 05/27/11  SR rate 65  Normal ECG   ASSESSMENT AND PLAN:  Chest Pain:  R/O normal ECG.  Have arranged myovue in our office for noon.  Patient unable to ambulate well enough due to obesity.  May need 2day protocol  Too heavy For chest CT protocol got beta blockers so cant get HR up.  [place] iv in  HTN:  Continue home meds  F/U [employer].   If myovue abnormal patient can F/U with me or Dr Martinique who is her husbands cardiologist  Signed: Jenkins Rouge 05/27/2011, 8:38 AM

## 2011-05-27 NOTE — Telephone Encounter (Signed)
The patient was here for a lexiscan myoview  today. The patient went to the ED last night and her BP was low per patient. Baseline BP here 87/65 sitting, HR 59 and BP 95/68 standing HR 71. The patient had been off lisinopril/Hctz x 24 hrs. Notified Dr. Teofilo Pod nurse of BP readings, and the patient to have BP check at Dr Teofilo Pod office tomorrow before taking the  Lisinopril. Ivan Lacher,RN.

## 2011-05-27 NOTE — ED Notes (Signed)
Pt dressing and awaiting discharge papers . She is going directly to dr Johnsie Cancel office for a stress test. Dr Johnsie Cancel has requested pt be left with iv access. Denies chest pain upon discharge

## 2011-05-27 NOTE — ED Provider Notes (Signed)
Pt here in CDU on CP protocol. CT card ordered for this morning. Pt however had a calculated BMI of 39, and she therefore does not qualify for the test. Pt already received beta blockers per protocol, and therefore she is unable to have a stress test. Discussed with Dr. Winfred Leeds, who will consult cardiology.  9:06 AM Pt seen by cardiology, scheduled for Myoview at their office around 11-12. Will monitor until then.  Renold Genta, [place] 05/27/11 [phone]

## 2011-05-27 NOTE — Progress Notes (Signed)
[place] [address] [phone]  Cardiology Nuclear Med Study  Connie Porter is a 56 y.o. female [id number] 02/22/1956   Nuclear Med Background Indication for Stress Test:  Evaluation for Ischemia and 05/26/11 [facility] ED: Chest pain, (-) enzymes History:  No previous documented CAD, and 10 yrs ago GXT: NL per patient [facility]) Cardiac Risk Factors: Family History - CAD, Hypertension and Obesity  Symptoms: Chest Pain and Tightness with/without exertion (last occurrence last night),  DOE, Fatigue and Fatigue with Exertion   Nuclear Pre-Procedure Caffeine/Decaff Intake:  None NPO After: 8:30pm   Lungs:  Clear IV 0.9% NS with Angio Cath:  20g  IV Site: L Forearm, unremarkable IV Started by:  Hospital staff  Chest Size (in):  42 Cup Size: DD  Height: 5' 7"  (1.702 m)  Weight:  252 lb (114.306 kg)  BMI:  Body mass index is 39.47 kg/(m^2). Tech Comments:  Last lisinopril/Hctz was 24 hrs ago    Nuclear Med Study 1 or 2 day study: 2 day  Stress Test Type:  Treadmill/Lexiscan  Reading MD: Loralie Champagne, MD  Order Authorizing Provider:  Jenkins Rouge, MD  Resting Radionuclide: Technetium 10mTetrofosmin  Resting Radionuclide Dose: 33 mCi  05/27/11  Stress Radionuclide:  Technetium 957metrofosmin  Stress Radionuclide Dose: 33 mCi  05/29/11           Stress Protocol Rest HR: 59 Stress HR: 99  Rest BPSK:AJGOTLX87/65, Standing 95/68 Stress BP: 107/57  Exercise Time (min): 2:00 METS: n/a   Predicted Max HR: 164 bpm % Max HR: 60.37 bpm Rate Pressure Product: 10593   Dose of Adenosine (mg):  n/a Dose of Lexiscan: 0.4 mg  Dose of Atropine (mg): n/a Dose of Dobutamine: n/a mcg/kg/min (at max HR)  Stress Test Technologist: PaIrven BaltimoreRN  Nuclear Technologist:  StCharlton AmorCNMT     Rest Procedure:  Myocardial perfusion imaging was performed at rest 45 minutes following the intravenous administration of  Technetium 9934mtrofosmin. Rest ECG: NSR with Nonspecific T wave changes  Stress Procedure:  The patient received IV Lexiscan 0.4 mg over 15-seconds with concurrent low level exercise.  Technetium 15m9mrofosmin was injected at 30-seconds while the patient continued walking.  There were no significant changes with Lexiscan. The BP sitting was low at baseline and in recovery BP 80/56 (Asymptomatic).IV 0.9% NACl 250cc given with follow-up BP 112/76. Quantitative spect images were obtained after a 45-minute delay. Stress ECG: No significant change from baseline ECG  QPS Raw Data Images:  Normal; no motion artifact; normal heart/lung ratio. Stress Images:  Normal homogeneous uptake in all areas of the myocardium. Rest Images:  Normal homogeneous uptake in all areas of the myocardium. Subtraction (SDS):  Normal Transient Ischemic Dilatation (Normal <1.22):  .94 Lung/Heart Ratio (Normal <0.45):  .28  Quantitative Gated Spect Images QGS EDV:  91 ml QGS ESV:  24 ml QGS cine images:  NL LV Function; NL Wall Motion QGS EF: 73%  Impression Exercise Capacity:  Lexiscan with low level exercise. BP Response:  Normal blood pressure response. Clinical Symptoms:  No chest pain. ECG Impression:  No significant ST segment change suggestive of ischemia. Comparison with Prior Nuclear Study: No previous nuclear study performed  Overall Impression:  Normal stress nuclear study.  PeteJenkins Rouge

## 2011-05-27 NOTE — Progress Notes (Signed)
Observation review is complete.

## 2011-05-27 NOTE — ED Notes (Signed)
Pt bmi is 20.5

## 2011-05-27 NOTE — ED Provider Notes (Signed)
Complaint of anterior chest pain onset 2 days ago pain , left-sided intermittent became worse when she got up to walk inside of her house however lasted throughout the night 2 nights ago even after she rested. She is presently asymptomatic in no distress. Patient's BMI calculated at 39. Too high for cardiac CT. Patient not felt to be a good candidate for stress echo.. Case discussed with Dr.Nishan who will come to evaluate patient in the ED. Dr. Johnsie Cancel evaluated the patient and arrange for further outpatient testing today  Orlie Dakin, MD 05/27/11 [phone]

## 2011-05-27 NOTE — ED Provider Notes (Signed)
Medical screening examination/treatment/procedure(s) were conducted as a shared visit with non-physician practitioner(s) and myself.  I personally evaluated the patient during the encounter  Orlie Dakin, MD 05/27/11 1754

## 2011-05-29 ENCOUNTER — Ambulatory Visit (HOSPITAL_COMMUNITY): Payer: BC Managed Care – PPO | Attending: Cardiovascular Disease | Admitting: Radiology

## 2011-05-29 DIAGNOSIS — R0989 Other specified symptoms and signs involving the circulatory and respiratory systems: Secondary | ICD-10-CM

## 2011-05-29 MED ORDER — TECHNETIUM TC 99M TETROFOSMIN IV KIT
33.0000 | PACK | Freq: Once | INTRAVENOUS | Status: AC | PRN
  Administered 2011-05-29: 33 via INTRAVENOUS

## 2011-11-19 ENCOUNTER — Other Ambulatory Visit: Payer: Self-pay | Admitting: Gastroenterology

## 2012-02-18 ENCOUNTER — Other Ambulatory Visit: Payer: Self-pay | Admitting: Gastroenterology

## 2012-02-19 ENCOUNTER — Telehealth: Payer: Self-pay | Admitting: Gastroenterology

## 2012-02-19 MED ORDER — MESALAMINE 400 MG PO CPDR: 1200.0000 mg | DELAYED_RELEASE_CAPSULE | Freq: Three times a day (TID) | ORAL | Status: DC

## 2012-02-19 NOTE — Telephone Encounter (Signed)
Pt aware and med has been sent to last until appt

## 2012-03-18 ENCOUNTER — Encounter: Payer: Self-pay | Admitting: Gastroenterology

## 2012-03-18 ENCOUNTER — Ambulatory Visit (INDEPENDENT_AMBULATORY_CARE_PROVIDER_SITE_OTHER): Payer: BC Managed Care – PPO | Admitting: Gastroenterology

## 2012-03-18 VITALS — BP 104/76 | HR 80 | Ht 67.0 in | Wt 267.0 lb

## 2012-03-18 DIAGNOSIS — K222 Esophageal obstruction: Secondary | ICD-10-CM

## 2012-03-18 DIAGNOSIS — R1310 Dysphagia, unspecified: Secondary | ICD-10-CM

## 2012-03-18 DIAGNOSIS — R197 Diarrhea, unspecified: Secondary | ICD-10-CM

## 2012-03-18 DIAGNOSIS — Q393 Congenital stenosis and stricture of esophagus: Secondary | ICD-10-CM

## 2012-03-18 MED ORDER — MESALAMINE 400 MG PO CPDR: 1600.0000 mg | DELAYED_RELEASE_CAPSULE | Freq: Three times a day (TID) | ORAL | Status: DC

## 2012-03-18 NOTE — Patient Instructions (Addendum)
You will be set up for an upper endoscopy for dysphagia, history of ring (LEC, moderate sedation). Increase dose of delzicol (to 4 pills three times a day). New script written. Start once daily imodium, every morning shortly after you wake.  (loperimide) Return in 4-6 weeks. You will have labs checked today in the basement lab.  Please head down after you check out with the front desk  (c. Diff by pcr). You have been scheduled for an endoscopy.   Please follow written instructions given to you at your visit today. If you use inhalers (even only as needed) or a CPAP machine, please bring them with you on the day of your procedure.

## 2012-03-18 NOTE — Progress Notes (Signed)
Review of gastrointestinal problems:  1.Crohn's ileocolitis: Workup at [facility].Marland KitchenMarland KitchenPromethius IBD Serology 7 testing 02/2008 "pattern consistent with IBD.Marland KitchenMarland KitchenCrohn's." Colonoscopy 01/2008 showed terminal ileum and right colon ulcerations, pathology c/w acute and chronic ileitis, colitis, also HP polyps. Colonoscopy Path report from 2006 showed HP polyps. Office note 05/2009 documented previous C. diff colitis (treated empirically and also after toxin +).   2012 doing well on oral mesalamine 3.6 gms/day 2. Dysphasia: EGD August, 2011 found Schatzki's ring that was dilated to 20 mm, small hiatal hernia, tortuous esophagus. Swallowing much improved. March 2012 PPI only every other day.   HPI: This is a    very pleasant 56 year old woman whom I last saw over a year ago  Has had several "flares" the past several months.  Pain, vomiting, diarrhea episodes 4 times.  The episodes last 12-18 hours and then she is absolutely fine in between.  No associated bleeding.    She is usually having 4-5 liquid stools per day, soft mushy.  Non-bloody.  This is up from her previous normal 2 bms a day.  She was on z pack for 'sinus infection' a couple months ago.  Also having solid food dysphagia.  Weight fluctuates overall.  Down 10 pounds in past 12 months, on our scale.   Past Medical History  Diagnosis Date  . Hypertension   . Crohn's ileocolitis   . C. difficile diarrhea   . Schatzki's ring   . Hiatal hernia     History reviewed. No pertinent past surgical history.  Current Outpatient Prescriptions  Medication Sig Dispense Refill  . CALCIUM CITRATE PO Take 1 tablet by mouth 2 (two) times daily.      . Cholecalciferol (VITAMIN D-3 PO) Take 1 capsule by mouth at bedtime.      . Cyanocobalamin (VITAMIN B-12 PO) Take 1 tablet by mouth daily.      Marland Kitchen levocetirizine (XYZAL) 5 MG tablet Take 5 mg by mouth every evening.      . Mesalamine (DELZICOL) 400 MG CPDR Take by mouth.      . montelukast (SINGULAIR)  10 MG tablet Take 10 mg by mouth at bedtime.      . Multiple Vitamin (MULITIVITAMIN WITH MINERALS) TABS Take 1 tablet by mouth 2 (two) times daily.      . Omega-3 Fatty Acids (FISH OIL PO) Take 1 capsule by mouth 2 (two) times daily.      . Mesalamine (DELZICOL) 400 MG CPDR Take 3 capsules (1,200 mg total) by mouth 3 (three) times daily.  270 capsule  1  . [DISCONTINUED] mesalamine (ASACOL) 400 MG EC tablet Take 1,200 mg by mouth 3 (three) times daily.        Allergies as of 03/18/2012  . (No Known Allergies)    History reviewed. No pertinent family history.  History   Social History  . Marital Status: Married    Spouse Name: N/A    Number of Children: 3  . Years of Education: N/A   Occupational History  . Retired Pharmacist, hospital    Social History Main Topics  . Smoking status: Never Smoker   . Smokeless tobacco: Never Used  . Alcohol Use: Yes  . Drug Use: No  . Sexually Active: Not on file   Other Topics Concern  . Not on file   Social History Narrative  . No narrative on file      Physical Exam: BP 104/76  Pulse 80  Ht 5' 7"  (1.702 m)  Wt 267 lb (121.11  kg)  BMI 41.82 kg/m2 Constitutional: generally well-appearing Psychiatric: alert and oriented x3 Abdomen: soft, nontender, nondistended, no obvious ascites, no peritoneal signs, normal bowel sounds     Assessment and plan: 56 y.o. female with Crohn's ileocolitis, dysphasia  First disease flareup episodes which she described are not clearly Crohn's exacerbations to me. They seem to brief. She has however noticed a definite worsening of her chronic bowel habits, she used to have 2 stools a day now she is having more like 45. She is going to increase her mesalamine to full strength, 4.8 g daily and she will return to see me in 4-6 weeks to report on her symptoms. I'm also going to have stool tested for C. difficile which she has had in the past. She has intermittent dysphasia. Known Schatzki's ring in the past and we  will set her up with repeat EGD, dilation.

## 2012-03-19 ENCOUNTER — Other Ambulatory Visit: Payer: BC Managed Care – PPO

## 2012-03-19 DIAGNOSIS — K222 Esophageal obstruction: Secondary | ICD-10-CM

## 2012-03-19 DIAGNOSIS — R197 Diarrhea, unspecified: Secondary | ICD-10-CM

## 2012-03-19 DIAGNOSIS — R1310 Dysphagia, unspecified: Secondary | ICD-10-CM

## 2012-03-27 ENCOUNTER — Encounter: Payer: Self-pay | Admitting: Gastroenterology

## 2012-03-27 ENCOUNTER — Ambulatory Visit (AMBULATORY_SURGERY_CENTER): Payer: BC Managed Care – PPO | Admitting: Gastroenterology

## 2012-03-27 VITALS — BP 127/64 | HR 63 | Temp 98.5°F | Resp 15 | Ht 67.0 in | Wt 267.0 lb

## 2012-03-27 DIAGNOSIS — K222 Esophageal obstruction: Secondary | ICD-10-CM

## 2012-03-27 DIAGNOSIS — R1310 Dysphagia, unspecified: Secondary | ICD-10-CM

## 2012-03-27 DIAGNOSIS — R197 Diarrhea, unspecified: Secondary | ICD-10-CM

## 2012-03-27 DIAGNOSIS — Q391 Atresia of esophagus with tracheo-esophageal fistula: Secondary | ICD-10-CM

## 2012-03-27 DIAGNOSIS — Q393 Congenital stenosis and stricture of esophagus: Secondary | ICD-10-CM

## 2012-03-27 MED ORDER — SODIUM CHLORIDE 0.9 % IV SOLN: 500.0000 mL | INTRAVENOUS | Status: DC

## 2012-03-27 NOTE — Op Note (Signed)
[place]  [address] [place], [address]   ENDOSCOPY PROCEDURE REPORT  PATIENT: [name], Connie Porter  MR#: [id number] BIRTHDATE: 1955-09-06 , 72  yrs. old GENDER: Female ENDOSCOPIST: Milus Banister, MD PROCEDURE DATE:  03/27/2012 PROCEDURE:  EGD, with balloon dilation ASA CLASS:     Class II INDICATIONS:  dysphgai, history of Schatzki's ring. MEDICATIONS: Fentanyl 50 mcg IV, Versed 4 mg IV, and These medications were titrated to patient response per physician's verbal order TOPICAL ANESTHETIC: Cetacaine Spray  DESCRIPTION OF PROCEDURE: After the risks benefits and alternatives of the procedure were thoroughly explained, informed consent was obtained.  The LB GIF-H180 [id number] endoscope was introduced through the mouth and advanced to the second portion of the duodenum. Without limitations.  The instrument was slowly withdrawn as the mucosa was fully examined.     There was a 4 cm hiatal hernia with resultant foreshortened and distally tortuous esophagus.  There was a focal Schatzki's ring at GE junction.  This was dilated with 67m CRE TTS balloon held inflated for 1 minute.  There was the usual minor mucosal tear and self limited oozing of blood following dilation.  The examination was otherwise normal.  Retroflexed views revealed no abnormalities. The scope was then withdrawn from the patient and the procedure completed. COMPLICATIONS: There were no complications.  ENDOSCOPIC IMPRESSION: There was a 4 cm hiatal hernia with resultant foreshortened and distally tortuous esophagus. There was a focal Schatzki's ring at GE junction, dilated today. The examination was otherwise normal.  RECOMMENDATIONS: Follow clinically.  Please call if you have a return of your swallowing difficulty.   eSigned:  DMilus Banister MD 03/27/2012 11:37 AM

## 2012-03-27 NOTE — Patient Instructions (Addendum)
Discharge instructions given with verbal understanding. Handout on a hiatal hernia given. Resume previous medications. YOU HAD AN ENDOSCOPIC PROCEDURE TODAY AT [facility]: Refer to the procedure report that was given to you for any specific questions about what was found during the examination.  If the procedure report does not answer your questions, please call your gastroenterologist to clarify.  If you requested that your care partner not be given the details of your procedure findings, then the procedure report has been included in a sealed envelope for you to review at your convenience later.  YOU SHOULD EXPECT: Some feelings of bloating in the abdomen. Passage of more gas than usual.  Walking can help get rid of the air that was put into your GI tract during the procedure and reduce the bloating. If you had a lower endoscopy (such as a colonoscopy or flexible sigmoidoscopy) you may notice spotting of blood in your stool or on the toilet paper. If you underwent a bowel prep for your procedure, then you may not have a normal bowel movement for a few days.  DIET: Your first meal following the procedure should be a light meal and then it is ok to progress to your normal diet.  A half-sandwich or bowl of soup is an example of a good first meal.  Heavy or fried foods are harder to digest and may make you feel nauseous or bloated.  Likewise meals heavy in dairy and vegetables can cause extra gas to form and this can also increase the bloating.  Drink plenty of fluids but you should avoid alcoholic beverages for 24 hours.  ACTIVITY: Your care partner should take you home directly after the procedure.  You should plan to take it easy, moving slowly for the rest of the day.  You can resume normal activity the day after the procedure however you should NOT DRIVE or use heavy machinery for 24 hours (because of the sedation medicines used during the test).    SYMPTOMS TO REPORT IMMEDIATELY: A  gastroenterologist can be reached at any hour.  During normal business hours, 8:30 AM to 5:00 PM Monday through Friday, call [phone].  After hours and on weekends, please call the GI answering service at [phone] who will take a message and have the physician on call contact you.   Following upper endoscopy (EGD)  Vomiting of blood or coffee ground material  New chest pain or pain under the shoulder blades  Painful or persistently difficult swallowing  New shortness of breath  Fever of 100F or higher  Black, tarry-looking stools  FOLLOW UP: If any biopsies were taken you will be contacted by phone or by letter within the next 1-3 weeks.  Call your gastroenterologist if you have not heard about the biopsies in 3 weeks.  Our staff will call the home number listed on your records the next business day following your procedure to check on you and address any questions or concerns that you may have at that time regarding the information given to you following your procedure. This is a courtesy call and so if there is no answer at the home number and we have not heard from you through the emergency physician on call, we will assume that you have returned to your regular daily activities without incident.  SIGNATURES/CONFIDENTIALITY: You and/or your care partner have signed paperwork which will be entered into your electronic medical record.  These signatures attest to the fact that that the information  above on your After Visit Summary has been reviewed and is understood.  Full responsibility of the confidentiality of this discharge information lies with you and/or your care-partner.

## 2012-03-27 NOTE — Progress Notes (Signed)
Patient did not experience any of the following events: a burn prior to discharge; a fall within the facility; wrong site/side/patient/procedure/implant event; or a hospital transfer or hospital admission upon discharge from the facility. (G8907) Patient did not have preoperative order for IV antibiotic SSI prophylaxis. (G8918)  

## 2012-03-30 ENCOUNTER — Telehealth: Payer: Self-pay | Admitting: *Deleted

## 2012-03-30 NOTE — Telephone Encounter (Signed)
  Follow up Call-  Call back number 03/27/2012  Post procedure Call Back phone  # [phone]  Permission to leave phone message Yes     Patient questions:  Do you have a fever, pain , or abdominal swelling? no Pain Score  0 *  Have you tolerated food without any problems? yes  Have you been able to return to your normal activities? yes  Do you have any questions about your discharge instructions: Diet   no Medications  no Follow up visit  no  Do you have questions or concerns about your Care? no  Actions: * If pain score is 4 or above: No action needed, pain <4. patient stating still some soreness in throat, reviewed signs and symptoms to report, patient denying any problems.

## 2012-04-28 ENCOUNTER — Encounter: Payer: Self-pay | Admitting: Gastroenterology

## 2012-04-28 ENCOUNTER — Ambulatory Visit (INDEPENDENT_AMBULATORY_CARE_PROVIDER_SITE_OTHER): Payer: BC Managed Care – PPO | Admitting: Gastroenterology

## 2012-04-28 VITALS — BP 136/64 | HR 90 | Ht 67.5 in | Wt 269.6 lb

## 2012-04-28 DIAGNOSIS — K5090 Crohn's disease, unspecified, without complications: Secondary | ICD-10-CM

## 2012-04-28 MED ORDER — PEG-KCL-NACL-NASULF-NA ASC-C 100 G PO SOLR: 1.0000 | Freq: Once | ORAL | Status: DC

## 2012-04-28 NOTE — Patient Instructions (Addendum)
You will be set up for a colonoscopy (LEC, moderate sedation for Crohn's disease, irregular bowels). In meantime stay on mesalamine.

## 2012-04-28 NOTE — Progress Notes (Signed)
Review of gastrointestinal problems:  1.Crohn's ileocolitis: Workup at [facility].Marland KitchenMarland KitchenPromethius IBD Serology 7 testing 02/2008 "pattern consistent with IBD.Marland KitchenMarland KitchenCrohn's." Colonoscopy 01/2008 showed terminal ileum and right colon ulcerations, pathology c/w acute and chronic ileitis, colitis, also HP polyps. Colonoscopy Path report from 2006 showed HP polyps. Office note 05/2009 documented previous C. diff colitis (treated empirically and also after toxin +). 2012 doing well on oral mesalamine 3.6 gms/day.  ? Worsening of disease 03/2013, c. Diff PCR neg; increased mesalamine to 4.8gm daily. 2. Dysphasia: EGD August, 2011 found Schatzki's ring that was dilated to 20 mm, small hiatal hernia, tortuous esophagus. Swallowing much improved. March 2012 PPI only every other day.  Repeat EGD 03/2013 for dysphagia showed 4cn HH, tortuous distal esophagus, Schatzki's ring that was dilated to 68m.   HPI: This is a   very pleasant 57year old woman whom I last saw time of EGD about a month ago.  Swallowing troubles gone.    Bowels 1-2 bms, alternating with 4-5 a day, pretty loose stools.   Diet has not really made a difference.  Takes imodium once daily.  She increased her mesalamine to full-strength about a month ago.   Past Medical History  Diagnosis Date  . Hypertension   . Crohn's ileocolitis   . C. difficile diarrhea   . Schatzki's ring   . Hiatal hernia   . Allergy   . GERD (gastroesophageal reflux disease)     Past Surgical History  Procedure Date  . Tonsillectomy and adenoidectomy   . C section x3     x 3  . Cholecystectomy   . Appendectomy   . Wisdom tooth extraction   . Colonoscopy   . Polypectomy     Current Outpatient Prescriptions  Medication Sig Dispense Refill  . CALCIUM CITRATE PO Take 1 tablet by mouth 2 (two) times daily.      . Cholecalciferol (VITAMIN D-3 PO) Take 1 capsule by mouth at bedtime.      . Cyanocobalamin (VITAMIN B-12 PO) Take 1 tablet by mouth daily.      .  fexofenadine (ALLEGRA) 180 MG tablet Take 180 mg by mouth daily.      .Marland Kitchenlevocetirizine (XYZAL) 5 MG tablet Take 5 mg by mouth every evening.      . Mesalamine (DELZICOL) 400 MG CPDR Take 4 capsules (1,600 mg total) by mouth 3 (three) times daily.  360 capsule  11  . montelukast (SINGULAIR) 10 MG tablet Take 10 mg by mouth at bedtime.      . Multiple Vitamin (MULITIVITAMIN WITH MINERALS) TABS Take 1 tablet by mouth 2 (two) times daily.      . Omega-3 Fatty Acids (FISH OIL PO) Take 1 capsule by mouth 2 (two) times daily.        Allergies as of 04/28/2012  . (No Known Allergies)    Family History  Problem Relation Age of Onset  . Heart disease Father   . Colon cancer Neg Hx   . Esophageal cancer Neg Hx   . Rectal cancer Neg Hx   . Stomach cancer Neg Hx     History   Social History  . Marital Status: Married    Spouse Name: N/A    Number of Children: 3  . Years of Education: N/A   Occupational History  . Retired TPharmacist, hospital   Social History Main Topics  . Smoking status: Former SResearch scientist (life sciences) . Smokeless tobacco: Never Used  . Alcohol Use: 0.0 oz/week    4-5 Glasses  of wine, 4-5 Drinks containing 0.5 oz of alcohol per week     Comment: pt drinks 4-5 drinks only on the weekends  . Drug Use: No  . Sexually Active: Not on file   Other Topics Concern  . Not on file   Social History Narrative  . No narrative on file      Physical Exam: BP 136/64  Pulse 90  Ht 5' 7.5" (1.715 m)  Wt 269 lb 9.6 oz (122.29 kg)  BMI 41.60 kg/m2 Constitutional: generally well-appearing Psychiatric: alert and oriented x3 Abdomen: soft, nontender, nondistended, no obvious ascites, no peritoneal signs, normal bowel sounds     Assessment and plan: 57 y.o. female with  improved dysphagia since EGD and dilation generally loose, alternating frequency loose stools.  She has a minimum of 2 BMs a day and sometimes up to 4 loose stools a day. Diet has not seemed to play a role in increasing her  mesalamine did not really help and a single Imodium every day has not helped. It made her wonder whether she is truly having Crohn's related diarrhea. Her last colonoscopy was 4-5 years ago and I recommended we repeat that now to restage her disease. Would consider immunomodulators if disease is significant.

## 2012-05-12 ENCOUNTER — Other Ambulatory Visit: Payer: Self-pay | Admitting: Gastroenterology

## 2012-05-12 ENCOUNTER — Encounter: Payer: Self-pay | Admitting: Gastroenterology

## 2012-05-12 ENCOUNTER — Ambulatory Visit (AMBULATORY_SURGERY_CENTER): Payer: BC Managed Care – PPO | Admitting: Gastroenterology

## 2012-05-12 ENCOUNTER — Other Ambulatory Visit: Payer: BC Managed Care – PPO

## 2012-05-12 ENCOUNTER — Other Ambulatory Visit: Payer: Self-pay

## 2012-05-12 VITALS — BP 108/49 | HR 66 | Temp 98.3°F | Resp 21 | Ht 67.0 in | Wt 269.0 lb

## 2012-05-12 DIAGNOSIS — K5090 Crohn's disease, unspecified, without complications: Secondary | ICD-10-CM

## 2012-05-12 DIAGNOSIS — K5010 Crohn's disease of large intestine without complications: Secondary | ICD-10-CM

## 2012-05-12 MED ORDER — SODIUM CHLORIDE 0.9 % IV SOLN: 500.0000 mL | INTRAVENOUS | Status: DC

## 2012-05-12 NOTE — Progress Notes (Addendum)
The pt was uncomfortable with the scope advancement to the cecum.  Her IV dripped slowly ans she would bend her hand slowing IV even more.  To signs of IV infiltration.  Pt was showing that she was getting medication, eyes rolling, snoring noted at times.  After the cecum was reached she relaxed and rested comfortably opening her eyes off and on speaking brief comments. Maw

## 2012-05-12 NOTE — Op Note (Signed)
[place]  [address] [place], [address]   COLONOSCOPY PROCEDURE REPORT  PATIENT: Connie Porter, Connie Porter  MR#: [id number] BIRTHDATE: 01-Jul-1955 , 64  yrs. old GENDER: Female ENDOSCOPIST: Milus Banister, MD PROCEDURE DATE:  05/12/2012 PROCEDURE:   Colonoscopy with biopsy ASA CLASS:   Class III INDICATIONS:Crohn's ileocolitis: Workup at [facility].Marland KitchenMarland KitchenPromethius IBD Serology 7 testing 02/2008 "pattern consistent with IBD.Marland KitchenMarland KitchenCrohn's." Colonoscopy 01/2008 showed terminal ileum and right colon ulcerations, pathology c/w acute and chronic ileitis, colitis, also HP polyps.  Colonoscopy Path report from 2006 showed HP polyps. Office note 05/2009 documented previous C.  diff colitis (treated empirically and also after toxin +).  2012 doing well on oral mesalamine 3.6 gms/day.  ? Worsening of disease 03/2013, c.  Diff PCR neg; increased mesalamine to 4.8gm daily. MEDICATIONS: Fentanyl 125 mcg IV, Versed 8 mg IV, and These medications were titrated to patient response per physician's verbal order  DESCRIPTION OF PROCEDURE:   After the risks benefits and alternatives of the procedure were thoroughly explained, informed consent was obtained.  A digital rectal exam revealed no abnormalities of the rectum.   The LB PCF-H180AL [id number]  endoscope was introduced through the anus and advanced to the terminal ileum which was intubated for a short distance. No adverse events experienced.   The quality of the prep was good, using MoviPrep The instrument was then slowly withdrawn as the colon was fully examined.  COLON FINDINGS: The colon was normal except for mild cecal inflammation near the IC valve and left sided diverticulosis.  The terminal ileum was moderate to severely inflamed with multiple ulcers, friable inflamed mucosa.  This was biospied and sent to pathology.  The examination was otherwise normal.  Retroflexed views revealed no abnormalities. The time to cecum=13.  minutes  29 seconds.  Withdrawal time=5 minutes 07 seconds.  The scope was withdrawn and the procedure completed. COMPLICATIONS: There were no complications.  ENDOSCOPIC IMPRESSION: The colon was normal except for mild cecal inflammation near the IC valve and left sided diverticulosis. Moderate to severe ileitis (Crohn's ileitis). Biopsied.  RECOMMENDATIONS: TPMT phenotype testing today, my office will help coordinate this bloodtest.  Once the result is back, I would like to start you on stronger class of medicine (azathiaprine).   eSigned:  Milus Banister, MD 05/12/2012 3:22 PM     PATIENT NAME:  [name], [name] MR#: [id number]

## 2012-05-12 NOTE — Progress Notes (Addendum)
Patient did not experience any of the following events: a burn prior to discharge; a fall within the facility; wrong site/side/patient/procedure/implant event; or a hospital transfer or hospital admission upon discharge from the facility. [phone]) Patient did not have preoperative order for IV antibiotic SSI prophylaxis. [phone])  ewm  Pt to go to lab after discharge from [facility] per Dr Ardis Hughs orders. Pt and care partner aware. ewm

## 2012-05-12 NOTE — Patient Instructions (Addendum)
YOU HAD AN ENDOSCOPIC PROCEDURE TODAY AT [facility]: Refer to the procedure report that was given to you for any specific questions about what was found during the examination.  If the procedure report does not answer your questions, please call your gastroenterologist to clarify.  If you requested that your care partner not be given the details of your procedure findings, then the procedure report has been included in a sealed envelope for you to review at your convenience later.  YOU SHOULD EXPECT: Some feelings of bloating in the abdomen. Passage of more gas than usual.  Walking can help get rid of the air that was put into your GI tract during the procedure and reduce the bloating. If you had a lower endoscopy (such as a colonoscopy or flexible sigmoidoscopy) you may notice spotting of blood in your stool or on the toilet paper. If you underwent a bowel prep for your procedure, then you may not have a normal bowel movement for a few days.  DIET: Your first meal following the procedure should be a light meal and then it is ok to progress to your normal diet.  A half-sandwich or bowl of soup is an example of a good first meal.  Heavy or fried foods are harder to digest and may make you feel nauseous or bloated.  Likewise meals heavy in dairy and vegetables can cause extra gas to form and this can also increase the bloating.  Drink plenty of fluids but you should avoid alcoholic beverages for 24 hours.  ACTIVITY: Your care partner should take you home directly after the procedure.  You should plan to take it easy, moving slowly for the rest of the day.  You can resume normal activity the day after the procedure however you should NOT DRIVE or use heavy machinery for 24 hours (because of the sedation medicines used during the test).    SYMPTOMS TO REPORT IMMEDIATELY: A gastroenterologist can be reached at any hour.  During normal business hours, 8:30 AM to 5:00 PM Monday through Friday,  call [phone].  After hours and on weekends, please call the GI answering service at [phone] emergency number who will take a message and have the physician on call contact you.   Following lower endoscopy (colonoscopy or flexible sigmoidoscopy):  Excessive amounts of blood in the stool  Significant tenderness or worsening of abdominal pains  Swelling of the abdomen that is new, acute  Fever of 100F or higher  FOLLOW UP: If any biopsies were taken you will be contacted by phone or by letter within the next 1-3 weeks.  Call your gastroenterologist if you have not heard about the biopsies in 3 weeks.  Our staff will call the home number listed on your records the next business day following your procedure to check on you and address any questions or concerns that you may have at that time regarding the information given to you following your procedure. This is a courtesy call and so if there is no answer at the home number and we have not heard from you through the emergency physician on call, we will assume that you have returned to your regular daily activities without incident.  SIGNATURES/CONFIDENTIALITY: You and/or your care partner have signed paperwork which will be entered into your electronic medical record.  These signatures attest to the fact that that the information above on your After Visit Summary has been reviewed and is understood.  Full responsibility of the confidentiality  of this discharge information lies with you and/or your care-partner.   Handout on diverticulosis and high fiber diet Biopsies pending Lab work to be done today, dr Kellogg office did set this up for today. Once these results are back dr Ardis Hughs would like to start you on a stronger class of medicines.

## 2012-05-13 ENCOUNTER — Telehealth: Payer: Self-pay | Admitting: *Deleted

## 2012-05-13 NOTE — Telephone Encounter (Signed)
  Follow up Call-  Call back number 05/12/2012 03/27/2012  Post procedure Call Back phone  # [phone]  Permission to leave phone message Yes Yes     Patient questions:  Do you have a fever, pain , or abdominal swelling? no Pain Score  0 *  Have you tolerated food without any problems? yes  Have you been able to return to your normal activities? yes  Do you have any questions about your discharge instructions: Diet   no Medications  no Follow up visit  no  Do you have questions or concerns about your Care? no  Actions: * If pain score is 4 or above: No action needed, pain <4.

## 2012-05-18 LAB — THIOPURINE METHYLTRANSFERASE (TPMT), RBC

## 2012-05-20 ENCOUNTER — Other Ambulatory Visit: Payer: Self-pay

## 2012-05-20 DIAGNOSIS — K5090 Crohn's disease, unspecified, without complications: Secondary | ICD-10-CM

## 2012-05-20 MED ORDER — AZATHIOPRINE 100 MG PO TABS: 200.0000 mg | ORAL_TABLET | Freq: Every day | ORAL | Status: DC

## 2012-06-03 ENCOUNTER — Telehealth: Payer: Self-pay

## 2012-06-03 ENCOUNTER — Other Ambulatory Visit (INDEPENDENT_AMBULATORY_CARE_PROVIDER_SITE_OTHER): Payer: BC Managed Care – PPO

## 2012-06-03 DIAGNOSIS — K5090 Crohn's disease, unspecified, without complications: Secondary | ICD-10-CM

## 2012-06-03 LAB — CBC WITH DIFFERENTIAL/PLATELET
Basophils Relative: 0.7 % (ref 0.0–3.0)
Eosinophils Relative: 3.2 % (ref 0.0–5.0)
Lymphocytes Relative: 15.3 % (ref 12.0–46.0)
Neutrophils Relative %: 73.7 % (ref 43.0–77.0)
RBC: 4.33 Mil/uL (ref 3.87–5.11)
WBC: 6.5 10*3/uL (ref 4.5–10.5)

## 2012-06-03 LAB — BASIC METABOLIC PANEL
BUN: 12 mg/dL (ref 6–23)
Chloride: 104 mEq/L (ref 96–112)
Glucose, Bld: 92 mg/dL (ref 70–99)
Potassium: 4.8 mEq/L (ref 3.5–5.1)

## 2012-06-03 NOTE — Telephone Encounter (Signed)
Pt had labs drawn today

## 2012-06-03 NOTE — Telephone Encounter (Signed)
Message copied by Barron Alvine on Wed Jun 03, 2012  8:00 AM ------      Message from: Barron Alvine      Created: Wed May 20, 2012  1:16 PM       Pt to get labs ------

## 2012-06-03 NOTE — Telephone Encounter (Signed)
Left message on machine to call back  

## 2012-06-04 ENCOUNTER — Emergency Department (HOSPITAL_COMMUNITY)
Admission: EM | Admit: 2012-06-04 | Discharge: 2012-06-04 | Disposition: A | Discharge status: Home / Self Care | Payer: BC Managed Care – PPO | Attending: Emergency Medicine | Admitting: Emergency Medicine

## 2012-06-04 ENCOUNTER — Encounter (HOSPITAL_COMMUNITY): Payer: Self-pay | Admitting: Emergency Medicine

## 2012-06-04 DIAGNOSIS — Z87891 Personal history of nicotine dependence: Secondary | ICD-10-CM | POA: Insufficient documentation

## 2012-06-04 DIAGNOSIS — Z79899 Other long term (current) drug therapy: Secondary | ICD-10-CM | POA: Insufficient documentation

## 2012-06-04 DIAGNOSIS — R197 Diarrhea, unspecified: Secondary | ICD-10-CM | POA: Insufficient documentation

## 2012-06-04 DIAGNOSIS — Q391 Atresia of esophagus with tracheo-esophageal fistula: Secondary | ICD-10-CM | POA: Insufficient documentation

## 2012-06-04 DIAGNOSIS — R51 Headache: Secondary | ICD-10-CM | POA: Insufficient documentation

## 2012-06-04 DIAGNOSIS — J3489 Other specified disorders of nose and nasal sinuses: Secondary | ICD-10-CM | POA: Insufficient documentation

## 2012-06-04 DIAGNOSIS — Z8719 Personal history of other diseases of the digestive system: Secondary | ICD-10-CM | POA: Insufficient documentation

## 2012-06-04 DIAGNOSIS — I10 Essential (primary) hypertension: Secondary | ICD-10-CM | POA: Insufficient documentation

## 2012-06-04 DIAGNOSIS — Z8619 Personal history of other infectious and parasitic diseases: Secondary | ICD-10-CM | POA: Insufficient documentation

## 2012-06-04 DIAGNOSIS — J029 Acute pharyngitis, unspecified: Secondary | ICD-10-CM | POA: Insufficient documentation

## 2012-06-04 DIAGNOSIS — R1013 Epigastric pain: Secondary | ICD-10-CM | POA: Insufficient documentation

## 2012-06-04 DIAGNOSIS — R112 Nausea with vomiting, unspecified: Secondary | ICD-10-CM | POA: Insufficient documentation

## 2012-06-04 LAB — CBC WITH DIFFERENTIAL/PLATELET
Basophils Absolute: 0 10*3/uL (ref 0.0–0.1)
HCT: 39.1 % (ref 36.0–46.0)
Lymphocytes Relative: 6 % — ABNORMAL LOW (ref 12–46)
Lymphs Abs: 0.9 10*3/uL (ref 0.7–4.0)
Neutro Abs: 13.4 10*3/uL — ABNORMAL HIGH (ref 1.7–7.7)
Platelets: 344 10*3/uL (ref 150–400)
RBC: 4.36 MIL/uL (ref 3.87–5.11)
RDW: 12.9 % (ref 11.5–15.5)
WBC: 15.4 10*3/uL — ABNORMAL HIGH (ref 4.0–10.5)

## 2012-06-04 LAB — COMPREHENSIVE METABOLIC PANEL
ALT: 9 U/L (ref 0–35)
AST: 20 U/L (ref 0–37)
Alkaline Phosphatase: 73 U/L (ref 39–117)
CO2: 20 mEq/L (ref 19–32)
Chloride: 100 mEq/L (ref 96–112)
GFR calc non Af Amer: 75 mL/min — ABNORMAL LOW (ref >90)
Potassium: 3.8 mEq/L (ref 3.5–5.1)
Sodium: 135 mEq/L (ref 135–145)
Total Bilirubin: 0.3 mg/dL (ref 0.3–1.2)

## 2012-06-04 LAB — URINALYSIS, MICROSCOPIC ONLY
Bilirubin Urine: NEGATIVE
Glucose, UA: NEGATIVE mg/dL
Hgb urine dipstick: NEGATIVE
Ketones, ur: 40 mg/dL — AB
Protein, ur: 100 mg/dL — AB

## 2012-06-04 MED ORDER — SODIUM CHLORIDE 0.9 % IV SOLN
1000.0000 mL | Freq: Once | INTRAVENOUS | Status: AC
  Administered 2012-06-04: 1000 mL via INTRAVENOUS

## 2012-06-04 MED ORDER — ONDANSETRON HCL 4 MG/2ML IJ SOLN
4.0000 mg | Freq: Once | INTRAMUSCULAR | Status: AC
  Administered 2012-06-04: 4 mg via INTRAVENOUS
  Filled 2012-06-04: qty 2

## 2012-06-04 MED ORDER — ONDANSETRON HCL 4 MG PO TABS: 4.0000 mg | ORAL_TABLET | Freq: Three times a day (TID) | ORAL | Status: DC | PRN

## 2012-06-04 MED ORDER — PANTOPRAZOLE SODIUM 40 MG IV SOLR
40.0000 mg | Freq: Once | INTRAVENOUS | Status: AC
  Administered 2012-06-04: 40 mg via INTRAVENOUS
  Filled 2012-06-04: qty 40

## 2012-06-04 MED ORDER — PROMETHAZINE HCL 25 MG/ML IJ SOLN
12.5000 mg | Freq: Once | INTRAMUSCULAR | Status: AC
  Administered 2012-06-04: 12.5 mg via INTRAVENOUS
  Filled 2012-06-04: qty 1

## 2012-06-04 MED ORDER — SODIUM CHLORIDE 0.9 % IV BOLUS (SEPSIS)
1000.0000 mL | Freq: Once | INTRAVENOUS | Status: AC
  Administered 2012-06-04: 1000 mL via INTRAVENOUS

## 2012-06-04 NOTE — ED Provider Notes (Signed)
Mervin Kung, MD  I saw and evaluated the patient, reviewed the resident's note and I agree with the findings and plan.  Patient seen by me. Patient with a history of Crohn's disease patient with onset of vomiting last evening that improved in the early hours of the morning but then reoccurred today with several episodes 11. Patient is feeling better here in the ED. Urine specific gravity is very concentrated. I will give the patient another liter of normal saline she's had 1 L of normal saline and not being very much. She still feeling fine after that she can be discharged home with anti-medics. Have noted on her labs her white count has increased compared to the last blood cell count. The trigger that could be related to the persistent vomiting. Also possible that the patient since she does have some loose, so this could be a gastroenteritis viral in nature also is possible some of the diarrhea may be related to the amoxicillin which was started on Monday.  Clinically patient's abdomen is soft nontender do not think we have an acute surgical abdomen at this point in time CT scan to further evaluate abdominal process is probably not warranted as the patient does not feel is warranted.  Results for orders placed during the hospital encounter of 06/04/12  CBC WITH DIFFERENTIAL      Result Value Range   WBC 15.4 (*) 4.0 - 10.5 K/uL   RBC 4.36  3.87 - 5.11 MIL/uL   Hemoglobin 13.3  12.0 - 15.0 g/dL   HCT 39.1  36.0 - 46.0 %   MCV 89.7  78.0 - 100.0 fL   MCH 30.5  26.0 - 34.0 pg   MCHC 34.0  30.0 - 36.0 g/dL   RDW 12.9  11.5 - 15.5 %   Platelets 344  150 - 400 K/uL   Neutrophils Relative 87 (*) 43 - 77 %   Neutro Abs 13.4 (*) 1.7 - 7.7 K/uL   Lymphocytes Relative 6 (*) 12 - 46 %   Lymphs Abs 0.9  0.7 - 4.0 K/uL   Monocytes Relative 6  3 - 12 %   Monocytes Absolute 0.9  0.1 - 1.0 K/uL   Eosinophils Relative 1  0 - 5 %   Eosinophils Absolute 0.1  0.0 - 0.7 K/uL   Basophils Relative 0  0 -  1 %   Basophils Absolute 0.0  0.0 - 0.1 K/uL  COMPREHENSIVE METABOLIC PANEL      Result Value Range   Sodium 135  135 - 145 mEq/L   Potassium 3.8  3.5 - 5.1 mEq/L   Chloride 100  96 - 112 mEq/L   CO2 20  19 - 32 mEq/L   Glucose, Bld 130 (*) 70 - 99 mg/dL   BUN 15  6 - 23 mg/dL   Creatinine, Ser 0.85  0.50 - 1.10 mg/dL   Calcium 9.3  8.4 - 10.5 mg/dL   Total Protein 8.0  6.0 - 8.3 g/dL   Albumin 3.4 (*) 3.5 - 5.2 g/dL   AST 20  0 - 37 U/L   ALT 9  0 - 35 U/L   Alkaline Phosphatase 73  39 - 117 U/L   Total Bilirubin 0.3  0.3 - 1.2 mg/dL   GFR calc non Af Amer 75 (*) >90 mL/min   GFR calc Af Amer 86 (*) >90 mL/min  LIPASE, BLOOD      Result Value Range   Lipase 39  11 -  59 U/L  URINALYSIS, MICROSCOPIC ONLY      Result Value Range   Color, Urine ORANGE (*) YELLOW   APPearance CLOUDY (*) CLEAR   Specific Gravity, Urine 1.043 (*) 1.005 - 1.030   pH 5.0  5.0 - 8.0   Glucose, UA NEGATIVE  NEGATIVE mg/dL   Hgb urine dipstick NEGATIVE  NEGATIVE   Bilirubin Urine NEGATIVE  NEGATIVE   Ketones, ur 40 (*) NEGATIVE mg/dL   Protein, ur 100 (*) NEGATIVE mg/dL   Urobilinogen, UA 1.0  0.0 - 1.0 mg/dL   Nitrite NEGATIVE  NEGATIVE   Leukocytes, UA TRACE (*) NEGATIVE   WBC, UA 0-2  <3 WBC/hpf   Bacteria, UA RARE  RARE   Squamous Epithelial / LPF RARE  RARE   Casts GRANULAR CAST (*) NEGATIVE   Crystals CA OXALATE CRYSTALS (*) NEGATIVE   Urine-Other MUCOUS PRESENT       Mervin Kung, MD 06/04/12 [phone]

## 2012-06-04 NOTE — ED Provider Notes (Signed)
I saw and evaluated the patient, reviewed the resident's note and I agree with the findings and plan.    Mervin Kung, MD 06/04/12 [phone]

## 2012-06-04 NOTE — ED Provider Notes (Signed)
History     CSN: [id number]  Arrival date & time 06/04/12  1230   First MD Initiated Contact with Patient 06/04/12 1256      Chief Complaint  Patient presents with  . Emesis  . Diarrhea    HPI Pt is a 57 yo F with PMH of Crohn's disease presenting with 26 hours of persistent vomiting with intermittent diarrhea. She denies abd pain. No bloody emesis or stool, although she does state she has bilious emesis. She last vomited in triage. No fevers or chills. No known sick contacts. She is followed by Dr. Ardis Hughs for her chronic GI problems and had labs done yesterday. She states she had persistent vomiting as a child due to reported gastroparesis, but she has not had any issues like this recently.  She started taking an antibiotic 3 days ago for possible sinus infection.  Past Medical History  Diagnosis Date  . Hypertension   . Crohn's ileocolitis   . C. difficile diarrhea   . Schatzki's ring   . Hiatal hernia   . Allergy   . GERD (gastroesophageal reflux disease)     Past Surgical History  Procedure Laterality Date  . Tonsillectomy and adenoidectomy    . C section x3      x 3  . Cholecystectomy    . Appendectomy    . Wisdom tooth extraction    . Colonoscopy    . Polypectomy      Family History  Problem Relation Age of Onset  . Heart disease Father   . Colon cancer Neg Hx   . Esophageal cancer Neg Hx   . Rectal cancer Neg Hx   . Stomach cancer Neg Hx     History  Substance Use Topics  . Smoking status: Former Research scientist (life sciences)  . Smokeless tobacco: Never Used  . Alcohol Use: 0.0 oz/week    4-5 Glasses of wine, 4-5 Drinks containing 0.5 oz of alcohol per week     Comment: pt drinks 4-5 drinks only on the weekends    OB History   Grav Para Term Preterm Abortions TAB SAB Ect Mult Living                  Review of Systems  Constitutional: Negative for fever and chills.  HENT: Positive for congestion and sore throat. Negative for trouble swallowing.   Eyes: Negative for  visual disturbance.  Respiratory: Negative for shortness of breath.   Cardiovascular: Negative for chest pain.  Gastrointestinal: Positive for nausea, vomiting and diarrhea. Negative for abdominal pain and blood in stool.  Genitourinary: Negative for difficulty urinating.  Skin: Negative for rash.  Neurological: Positive for headaches.  All other systems reviewed and are negative.    Allergies  Review of patient's allergies indicates no known allergies.  Home Medications   Current Outpatient Rx  Name  Route  Sig  Dispense  Refill  . azathioprine (IMURAN) 100 MG tablet   Oral   Take 400 mg by mouth daily.         Marland Kitchen CALCIUM CITRATE PO   Oral   Take 1 tablet by mouth 2 (two) times daily.         . Cholecalciferol (VITAMIN D-3 PO)   Oral   Take 1 capsule by mouth at bedtime.         . Cyanocobalamin (VITAMIN B-12 PO)   Oral   Take 1 tablet by mouth daily.         . fexofenadine (  ALLEGRA) 180 MG tablet   Oral   Take 180 mg by mouth daily.         Marland Kitchen levocetirizine (XYZAL) 5 MG tablet   Oral   Take 5 mg by mouth every evening.         . montelukast (SINGULAIR) 10 MG tablet   Oral   Take 10 mg by mouth at bedtime.         . Multiple Vitamin (MULITIVITAMIN WITH MINERALS) TABS   Oral   Take 1 tablet by mouth 2 (two) times daily.         . Omega-3 Fatty Acids (FISH OIL PO)   Oral   Take 2 capsules by mouth 2 (two) times daily.            BP 121/65  Pulse 78  Temp(Src) 98.3 F (36.8 C) (Oral)  Resp 18  SpO2 91%  Physical Exam  Constitutional: She is oriented to person, place, and time. She appears well-developed and well-nourished. No distress.  HENT:  Head: Normocephalic and atraumatic.  Mouth/Throat: Mucous membranes are dry. No oropharyngeal exudate.  Neck: Normal range of motion.  Cardiovascular: Normal rate, regular rhythm and normal heart sounds.   Pulmonary/Chest: Effort normal and breath sounds normal.  Abdominal: Soft. Bowel sounds  are normal. She exhibits no mass. There is tenderness (epigastric). There is no rebound and no guarding.  Musculoskeletal: Normal range of motion. She exhibits no edema and no tenderness.  Lymphadenopathy:    She has no cervical adenopathy.  Neurological: She is alert and oriented to person, place, and time. No cranial nerve deficit.  Skin: Skin is warm and dry. No rash noted. She is not diaphoretic.    ED Course  Procedures (including critical care time)  Labs Reviewed  CBC WITH DIFFERENTIAL - Abnormal; Notable for the following:    WBC 15.4 (*)    Neutrophils Relative 87 (*)    Neutro Abs 13.4 (*)    Lymphocytes Relative 6 (*)    All other components within normal limits  COMPREHENSIVE METABOLIC PANEL - Abnormal; Notable for the following:    Glucose, Bld 130 (*)    Albumin 3.4 (*)    GFR calc non Af Amer 75 (*)    GFR calc Af Amer 86 (*)    All other components within normal limits  URINALYSIS, MICROSCOPIC ONLY - Abnormal; Notable for the following:    Color, Urine ORANGE (*)    APPearance CLOUDY (*)    Specific Gravity, Urine 1.043 (*)    Ketones, ur 40 (*)    Protein, ur 100 (*)    Leukocytes, UA TRACE (*)    Casts GRANULAR CAST (*)    Crystals CA OXALATE CRYSTALS (*)    All other components within normal limits  LIPASE, BLOOD   No results found.   1. Nausea and vomiting     MDM  57 yo F with vomiting and diarrhea since yesterday  Will check CBC, Cmet and lipase given persistent vomiting and location of epigastric tenderness. Continue IVF and give Zofran 3m.  Labs reviewed. WBC is slightly elevated, and her urine shows she is dry. Patient feeling better with Zofran. Most likely secondary to gastroenteritis. Will d/c home in stable condition with Zofran prn and encourage PO fluids as tolerated.  AMontez Morita MD 06/04/12 [phone]

## 2012-06-04 NOTE — ED Notes (Signed)
Pt c/o N/V/D x 2 days; pt actively vomiting in triage

## 2012-06-18 ENCOUNTER — Telehealth: Payer: Self-pay

## 2012-06-18 NOTE — Telephone Encounter (Signed)
Pt called and asked to check the weather line or channel 2 for closing or delay in case of bad weather and office is closed.  Pt agreed and weather line number given

## 2012-06-19 ENCOUNTER — Ambulatory Visit: Payer: BC Managed Care – PPO | Admitting: Gastroenterology

## 2012-06-23 ENCOUNTER — Encounter: Payer: Self-pay | Admitting: Gastroenterology

## 2012-06-23 ENCOUNTER — Other Ambulatory Visit (INDEPENDENT_AMBULATORY_CARE_PROVIDER_SITE_OTHER): Payer: BC Managed Care – PPO

## 2012-06-23 ENCOUNTER — Ambulatory Visit (INDEPENDENT_AMBULATORY_CARE_PROVIDER_SITE_OTHER): Payer: BC Managed Care – PPO | Admitting: Gastroenterology

## 2012-06-23 VITALS — BP 112/76 | HR 88 | Ht 67.0 in | Wt 255.0 lb

## 2012-06-23 DIAGNOSIS — K5090 Crohn's disease, unspecified, without complications: Secondary | ICD-10-CM | POA: Insufficient documentation

## 2012-06-23 LAB — CBC WITH DIFFERENTIAL/PLATELET
Basophils Absolute: 0.1 10*3/uL (ref 0.0–0.1)
Basophils Relative: 1.2 % (ref 0.0–3.0)
Eosinophils Absolute: 0.2 10*3/uL (ref 0.0–0.7)
Lymphocytes Relative: 26.4 % (ref 12.0–46.0)
MCHC: 33.1 g/dL (ref 30.0–36.0)
Neutrophils Relative %: 62.7 % (ref 43.0–77.0)
RBC: 4.04 Mil/uL (ref 3.87–5.11)

## 2012-06-23 LAB — COMPREHENSIVE METABOLIC PANEL
ALT: 20 U/L (ref 0–35)
AST: 23 U/L (ref 0–37)
Albumin: 3.4 g/dL — ABNORMAL LOW (ref 3.5–5.2)
BUN: 19 mg/dL (ref 6–23)
Calcium: 9.5 mg/dL (ref 8.4–10.5)
Chloride: 103 mEq/L (ref 96–112)
Potassium: 5.3 mEq/L — ABNORMAL HIGH (ref 3.5–5.1)

## 2012-06-23 MED ORDER — AZATHIOPRINE 100 MG PO TABS: 200.0000 mg | ORAL_TABLET | Freq: Every day | ORAL | Status: DC

## 2012-06-23 NOTE — Progress Notes (Signed)
Review of gastrointestinal problems:  1.Crohn's ileocolitis: Workup at [facility].Marland KitchenMarland KitchenPromethius IBD Serology 7 testing 02/2008 "pattern consistent with IBD.Marland KitchenMarland KitchenCrohn's." Colonoscopy 01/2008 showed terminal ileum and right colon ulcerations, pathology c/w acute and chronic ileitis, colitis, also HP polyps. Colonoscopy Path report from 2006 showed HP polyps. Office note 05/2009 documented previous C. diff colitis (treated empirically and also after toxin +). 2012 doing well on oral mesalamine 3.6 gms/day. ? Worsening of disease 03/2013, c. Diff PCR neg; increased mesalamine to 4.8gm daily.   Colonoscopy 04/2012 found essentially normal colon but moderate to severe ileitis (path confirmed acute and chronic inflammation), started on 229m azathiaprine daily 2. Dysphasia: EGD August, 2011 found Schatzki's ring that was dilated to 20 mm, small hiatal hernia, tortuous esophagus. Swallowing much improved. March 2012 PPI only every other day. Repeat EGD 03/2013 for dysphagia showed 4cn HH, tortuous distal esophagus, Schatzki's ring that was dilated to 223m   HPI: This is a    very pleasant [age]ear old woman whom I last saw about 2 months ago.  She has been taking azathioprine 400 mg a day in error. She was supposed to be taking 200 mg per day.  We are looking into how this occurred.  Started azathiaprine 1-2 months ago.  Her diarrhea has signficantly improved. Has 2 solid BMs in AM.  Abdominal pains are gone.    But now, she has had URI "the crud" for 5 weeks.  HAs required augmentin then a different.  Has had red patches on legs.  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  The spots are clearly improving.  Was given a prednisone shot for URI.      Past Medical History  Diagnosis Date  . Hypertension   . Crohn's ileocolitis   . C. difficile diarrhea   . Schatzki's ring   . Hiatal hernia   . Allergy   . GERD (gastroesophageal reflux disease)     Past Surgical History  Procedure Laterality Date  .  Tonsillectomy and adenoidectomy    . C section x3      x 3  . Cholecystectomy    . Appendectomy    . Wisdom tooth extraction    . Colonoscopy    . Polypectomy      Current Outpatient Prescriptions  Medication Sig Dispense Refill  . azathioprine (IMURAN) 100 MG tablet Take 400 mg by mouth daily.      . Marland KitchenALCIUM CITRATE PO Take 1 tablet by mouth 2 (two) times daily.      . Cholecalciferol (VITAMIN D-3 PO) Take 1 capsule by mouth at bedtime.      . Cyanocobalamin (VITAMIN B-12 PO) Take 1 tablet by mouth daily.      . fexofenadine (ALLEGRA) 180 MG tablet Take 180 mg by mouth daily.      . Marland Kitchenevocetirizine (XYZAL) 5 MG tablet Take 5 mg by mouth every evening.      . montelukast (SINGULAIR) 10 MG tablet Take 10 mg by mouth at bedtime.      . Multiple Vitamin (MULITIVITAMIN WITH MINERALS) TABS Take 1 tablet by mouth 2 (two) times daily.      . Omega-3 Fatty Acids (FISH OIL PO) Take 2 capsules by mouth 2 (two) times daily.        No current facility-administered medications for this visit.    Allergies as of 06/23/2012  . (No Known Allergies)    Family History  Problem Relation Age of Onset  . Heart disease Father   . Colon cancer Neg Hx   .  Esophageal cancer Neg Hx   . Rectal cancer Neg Hx   . Stomach cancer Neg Hx     History   Social History  . Marital Status: Married    Spouse Name: N/A    Number of Children: 3  . Years of Education: N/A   Occupational History  . Retired Pharmacist, hospital    Social History Main Topics  . Smoking status: Former Research scientist (life sciences)  . Smokeless tobacco: Never Used  . Alcohol Use: 0.0 oz/week    4-5 Glasses of wine, 4-5 Drinks containing 0.5 oz of alcohol per week     Comment: pt drinks 4-5 drinks only on the weekends  . Drug Use: No  . Sexually Active: Not on file   Other Topics Concern  . Not on file   Social History Narrative  . No narrative on file      Physical Exam: BP 112/76  Pulse 88  Ht 5' 7"  (1.702 m)  Wt 255 lb (115.667 kg)  BMI  39.93 kg/m2 Constitutional: generally well-appearing Psychiatric: alert and oriented x3 Abdomen: soft, nontender, nondistended, no obvious ascites, no peritoneal signs, normal bowel sounds There were several slightly raised round mildly purple lesions on her legs, these previously were tender and more raised and somewhat per patient.    Assessment and plan: 57 y.o. female with Crohn's disease  I am correcting the dosage of her azathioprine to 200 mg per day. 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  She knows call here if they return, worsen. For now she tells me the lesions are definitely improving. She'll get a repeat set of blood work including CBC, complete metabolic profile. She will return to see in 2 month and sooner if needed.

## 2012-06-23 NOTE — Patient Instructions (Addendum)
Call Dr. Ardis Hughs with any Erythema nodosum trouble. You should be taking 286m of azathiaprine per day, not 4014m  New script written. You will have labs checked today in the basement lab.  Please head down after you check out with the front desk  (cbc, cmet). Return to see Dr. JaArdis Hughsn 2 months, sooner if needed.                                                We are excited to introduce MyChart, a new best-in-class service that provides you online access to important information in your electronic medical record. We want to make it easier for you to view your health information - all in one secure location - when and where you need it. We expect MyChart will enhance the quality of care and service we provide.  When you register for MyChart, you can:    View your test results.    Request appointments and receive appointment reminders via email.    Request medication renewals.    View your medical history, allergies, medications and immunizations.    Communicate with your physician's office through a password-protected site.    Conveniently print information such as your medication lists.  To find out if MyChart is right for you, please talk to a member of our clinical staff today. We will gladly answer your questions about this free health and wellness tool.  If you are age 7753r older and want a member of your family to have access to your record, you must provide written consent by completing a proxy form available at our office. Please speak to our clinical staff about guidelines regarding accounts for patients younger than age 58 As you activate your MyChart account and need any technical assistance, please call the MyChart technical support line at [phone] ([phone] email your question to [email or web address]. If you email your question(s), please include your name, a return phone number and the best time to reach you.  If you have non-urgent health-related  questions, you can send a message to our office through [facility] [place] [email or web address] you have a medical emergency, call 911.  Thank you for using MyChart as your new health and wellness resource!   MyChart licensed from [employer] 1999-2010. Patents Pending.

## 2012-06-24 ENCOUNTER — Other Ambulatory Visit: Payer: Self-pay

## 2012-06-24 DIAGNOSIS — K5090 Crohn's disease, unspecified, without complications: Secondary | ICD-10-CM

## 2012-07-01 ENCOUNTER — Other Ambulatory Visit (INDEPENDENT_AMBULATORY_CARE_PROVIDER_SITE_OTHER): Payer: BC Managed Care – PPO

## 2012-07-01 DIAGNOSIS — K5090 Crohn's disease, unspecified, without complications: Secondary | ICD-10-CM

## 2012-07-01 LAB — CBC WITH DIFFERENTIAL/PLATELET
Basophils Absolute: 0.1 10*3/uL (ref 0.0–0.1)
Eosinophils Absolute: 0.1 10*3/uL (ref 0.0–0.7)
Lymphocytes Relative: 30.9 % (ref 12.0–46.0)
MCHC: 33.4 g/dL (ref 30.0–36.0)
Neutrophils Relative %: 59.2 % (ref 43.0–77.0)
RDW: 14.6 % (ref 11.5–14.6)

## 2012-07-01 LAB — BASIC METABOLIC PANEL
Chloride: 106 mEq/L (ref 96–112)
Creatinine, Ser: 0.9 mg/dL (ref 0.4–1.2)
Potassium: 4.1 mEq/L (ref 3.5–5.1)

## 2012-07-06 ENCOUNTER — Other Ambulatory Visit: Payer: Self-pay

## 2012-07-06 DIAGNOSIS — K5090 Crohn's disease, unspecified, without complications: Secondary | ICD-10-CM

## 2012-08-03 ENCOUNTER — Other Ambulatory Visit (INDEPENDENT_AMBULATORY_CARE_PROVIDER_SITE_OTHER): Payer: BC Managed Care – PPO

## 2012-08-03 ENCOUNTER — Encounter: Payer: Self-pay | Admitting: *Deleted

## 2012-08-03 DIAGNOSIS — K5090 Crohn's disease, unspecified, without complications: Secondary | ICD-10-CM

## 2012-08-03 LAB — CBC WITH DIFFERENTIAL/PLATELET
Basophils Relative: 0.9 % (ref 0.0–3.0)
Eosinophils Relative: 1.6 % (ref 0.0–5.0)
HCT: 33.8 % — ABNORMAL LOW (ref 36.0–46.0)
MCV: 89.4 fl (ref 78.0–100.0)
Monocytes Absolute: 0.3 10*3/uL (ref 0.1–1.0)
Monocytes Relative: 5.2 % (ref 3.0–12.0)
Neutrophils Relative %: 71.3 % (ref 43.0–77.0)
RBC: 3.78 Mil/uL — ABNORMAL LOW (ref 3.87–5.11)
WBC: 4.8 10*3/uL (ref 4.5–10.5)

## 2012-08-03 LAB — BASIC METABOLIC PANEL
BUN: 19 mg/dL (ref 6–23)
CO2: 26 mEq/L (ref 19–32)
GFR: 63.49 mL/min (ref >60.00)
Glucose, Bld: 105 mg/dL — ABNORMAL HIGH (ref 70–99)
Potassium: 3.9 mEq/L (ref 3.5–5.1)

## 2012-08-07 ENCOUNTER — Other Ambulatory Visit: Payer: Self-pay | Admitting: Allergy

## 2012-08-07 ENCOUNTER — Ambulatory Visit
Admission: RE | Admit: 2012-08-07 | Discharge: 2012-08-07 | Disposition: A | Discharge status: Home / Self Care | Payer: BC Managed Care – PPO | Source: Ambulatory Visit | Attending: Allergy | Admitting: Allergy

## 2012-08-07 DIAGNOSIS — J329 Chronic sinusitis, unspecified: Secondary | ICD-10-CM

## 2012-08-25 ENCOUNTER — Encounter: Payer: Self-pay | Admitting: Gastroenterology

## 2012-08-25 ENCOUNTER — Ambulatory Visit (INDEPENDENT_AMBULATORY_CARE_PROVIDER_SITE_OTHER)
Admission: RE | Admit: 2012-08-25 | Discharge: 2012-08-25 | Disposition: A | Discharge status: Home / Self Care | Payer: BC Managed Care – PPO | Source: Ambulatory Visit | Attending: Gastroenterology | Admitting: Gastroenterology

## 2012-08-25 ENCOUNTER — Other Ambulatory Visit: Payer: BC Managed Care – PPO

## 2012-08-25 ENCOUNTER — Other Ambulatory Visit: Payer: Self-pay

## 2012-08-25 ENCOUNTER — Ambulatory Visit (INDEPENDENT_AMBULATORY_CARE_PROVIDER_SITE_OTHER): Payer: BC Managed Care – PPO | Admitting: Gastroenterology

## 2012-08-25 VITALS — BP 108/72 | HR 92 | Ht 67.0 in | Wt 260.5 lb

## 2012-08-25 DIAGNOSIS — K5080 Crohn's disease of both small and large intestine without complications: Secondary | ICD-10-CM

## 2012-08-25 DIAGNOSIS — Z13820 Encounter for screening for osteoporosis: Secondary | ICD-10-CM

## 2012-08-25 NOTE — Progress Notes (Signed)
error 

## 2012-08-25 NOTE — Patient Instructions (Addendum)
You will have labs checked today in the basement lab.  Please head down after you check out with the front desk  ( Vitamin D 25 OH level). Dexa scan for bone loss Pneumococcal immunization today. Stay on imuran at current dose. Labs end of July (cbc, cmet) and ROV with Dr. Ardis Hughs shortly afterwards (3-4 months).                                               We are excited to introduce MyChart, a new best-in-class service that provides you online access to important information in your electronic medical record. We want to make it easier for you to view your health information - all in one secure location - when and where you need it. We expect MyChart will enhance the quality of care and service we provide.  When you register for MyChart, you can:    View your test results.    Request appointments and receive appointment reminders via email.    Request medication renewals.    View your medical history, allergies, medications and immunizations.    Communicate with your physician's office through a password-protected site.    Conveniently print information such as your medication lists.  To find out if MyChart is right for you, please talk to a member of our clinical staff today. We will gladly answer your questions about this free health and wellness tool.  If you are age 52 or older and want a member of your family to have access to your record, you must provide written consent by completing a proxy form available at our office. Please speak to our clinical staff about guidelines regarding accounts for patients younger than age 39.  As you activate your MyChart account and need any technical assistance, please call the MyChart technical support line at [phone]) or email your question to [email or web address]. If you email your question(s), please include your name, a return phone number and the best time to reach you.  If you have non-urgent health-related  questions, you can send a message to our office through [place] at [email or web address]. If you have a medical emergency, call 911.  Thank you for using MyChart as your new health and wellness resource!   MyChart licensed from [employer],  1999-2010. Patents Pending.

## 2012-08-25 NOTE — Progress Notes (Signed)
Review of gastrointestinal problems:  1.Crohn's ileocolitis: Workup at [facility].Marland KitchenMarland KitchenPromethius IBD Serology 7 testing 02/2008 "pattern consistent with IBD.Marland KitchenMarland KitchenCrohn's." Colonoscopy 01/2008 showed terminal ileum and right colon ulcerations, pathology c/w acute and chronic ileitis, colitis, also HP polyps. Colonoscopy Path report from 2006 showed HP polyps. Office note 05/2009 documented previous C. diff colitis (treated empirically and also after toxin +). 2012 doing well on oral mesalamine 3.6 gms/day. ? Worsening of disease 03/2013, c. Diff PCR neg; increased mesalamine to 4.8gm daily. Colonoscopy 04/2012 found essentially normal colon but moderate to severe ileitis (path confirmed acute and chronic inflammation), started on 215m azathiaprine daily  2. Dysphasia: EGD August, 2011 found Schatzki's ring that was dilated to 20 mm, small hiatal hernia, tortuous esophagus. Swallowing much improved. March 2012 PPI only every other day. Repeat EGD 03/2013 for dysphagia showed 4cn HH, tortuous distal esophagus, Schatzki's ring that was dilated to 232m IBD description:   Corticosteroid sparing therapy prescribed: yes  Bone loss assessment  Vitamin D 25-OH level (today):   Bone Density Assessment (today):   TB testing prior to Anti TNF therapy: last year with PCP  Hep B surface Ag, Hep B surface Antibody before Anti TNF therapy:  (2 years ago prior to teaching)  Influenza immunization (she gets every year)  Pneumococcal immunization (today)  Tobacco Screening and counciling done  HPI: This is a very pleasant 89ear old woman whom I last saw about 2 months ago.  She has BMs in AM.  Soft, no form.  No bleeding. 2 BMS per day.  SHe is happy with her GI system currently.  Bone scan 3-4 years at least was normal.  Gets flu vaccine yearly.  Has not had pnuemococcal vaccine   Past Medical History  Diagnosis Date  . Hypertension   . Crohn's ileocolitis   . C. difficile diarrhea   . Schatzki's ring    . Hiatal hernia   . Allergy   . GERD (gastroesophageal reflux disease)     Past Surgical History  Procedure Laterality Date  . Tonsillectomy and adenoidectomy    . C section x3      x 3  . Cholecystectomy    . Appendectomy    . Wisdom tooth extraction    . Colonoscopy    . Polypectomy      Current Outpatient Prescriptions  Medication Sig Dispense Refill  . azaTHIOprine (IMURAN) 50 MG tablet Take 200 mg by mouth daily.      . Marland KitchenALCIUM CITRATE PO Take 1 tablet by mouth 2 (two) times daily.      . Cholecalciferol (VITAMIN D-3 PO) Take 1 capsule by mouth at bedtime.      . Cyanocobalamin (VITAMIN B-12 PO) Take 1 tablet by mouth daily.      . fexofenadine (ALLEGRA) 180 MG tablet Take 180 mg by mouth daily.      . Marland Kitchenevocetirizine (XYZAL) 5 MG tablet Take 5 mg by mouth every evening.      . montelukast (SINGULAIR) 10 MG tablet Take 10 mg by mouth at bedtime.      . Multiple Vitamin (MULITIVITAMIN WITH MINERALS) TABS Take 1 tablet by mouth 2 (two) times daily.      . Omega-3 Fatty Acids (FISH OIL PO) Take 2 capsules by mouth 2 (two) times daily.        No current facility-administered medications for this visit.    Allergies as of 08/25/2012  . (No Known Allergies)    Family History  Problem Relation Age of Onset  .  Heart disease Father   . Colon cancer Neg Hx   . Esophageal cancer Neg Hx   . Rectal cancer Neg Hx   . Stomach cancer Neg Hx     History   Social History  . Marital Status: Married    Spouse Name: N/A    Number of Children: 3  . Years of Education: N/A   Occupational History  . Retired Pharmacist, hospital    Social History Main Topics  . Smoking status: Never Smoker   . Smokeless tobacco: Never Used  . Alcohol Use: 0.0 oz/week    4-5 Glasses of wine, 4-5 Drinks containing 0.5 oz of alcohol per week     Comment: pt drinks 4-5 drinks only on the weekends  . Drug Use: No  . Sexually Active: Not on file   Other Topics Concern  . Not on file   Social History  Narrative  . No narrative on file      Physical Exam: BP 108/72  Pulse 92  Ht 5' 7"  (1.702 m)  Wt 260 lb 8 oz (118.162 kg)  BMI 40.79 kg/m2 Constitutional: generally well-appearing Psychiatric: alert and oriented x3 Abdomen: soft, nontender, nondistended, no obvious ascites, no peritoneal signs, normal bowel sounds     Assessment and plan: 57 y.o. female with Crohn's ileocolitis under good control  She will stay on azathioprine 200 mg per day. We are setting her up with vitamin D level, DEXA scan today as well as pneumococcal vaccination. She will return to see me in 3-4 months, sooner if necessary. She'll have some labs checked just prior to that visit.

## 2012-10-27 ENCOUNTER — Ambulatory Visit: Payer: BC Managed Care – PPO | Admitting: Gastroenterology

## 2012-11-11 ENCOUNTER — Other Ambulatory Visit (INDEPENDENT_AMBULATORY_CARE_PROVIDER_SITE_OTHER): Payer: BC Managed Care – PPO

## 2012-11-11 DIAGNOSIS — K5080 Crohn's disease of both small and large intestine without complications: Secondary | ICD-10-CM

## 2012-11-11 LAB — CBC WITH DIFFERENTIAL/PLATELET
Basophils Absolute: 0 10*3/uL (ref 0.0–0.1)
Basophils Relative: 0.7 % (ref 0.0–3.0)
Eosinophils Absolute: 0.1 10*3/uL (ref 0.0–0.7)
HCT: 37 % (ref 36.0–46.0)
Hemoglobin: 12.4 g/dL (ref 12.0–15.0)
Lymphocytes Relative: 24.1 % (ref 12.0–46.0)
Lymphs Abs: 0.9 10*3/uL (ref 0.7–4.0)
MCHC: 33.6 g/dL (ref 30.0–36.0)
MCV: 96.8 fl (ref 78.0–100.0)
Monocytes Absolute: 0.3 10*3/uL (ref 0.1–1.0)
Neutro Abs: 2.4 10*3/uL (ref 1.4–7.7)
RDW: 14.5 % (ref 11.5–14.6)

## 2012-11-11 LAB — COMPREHENSIVE METABOLIC PANEL
ALT: 13 U/L (ref 0–35)
AST: 15 U/L (ref 0–37)
Alkaline Phosphatase: 47 U/L (ref 39–117)
BUN: 18 mg/dL (ref 6–23)
Creatinine, Ser: 1 mg/dL (ref 0.4–1.2)
Total Bilirubin: 0.5 mg/dL (ref 0.3–1.2)

## 2012-11-18 ENCOUNTER — Ambulatory Visit (INDEPENDENT_AMBULATORY_CARE_PROVIDER_SITE_OTHER): Payer: BC Managed Care – PPO | Admitting: Gastroenterology

## 2012-11-18 ENCOUNTER — Encounter: Payer: Self-pay | Admitting: Gastroenterology

## 2012-11-18 VITALS — BP 110/70 | HR 70 | Ht 67.0 in | Wt 264.0 lb

## 2012-11-18 DIAGNOSIS — R933 Abnormal findings on diagnostic imaging of other parts of digestive tract: Secondary | ICD-10-CM

## 2012-11-18 NOTE — Progress Notes (Signed)
Review of gastrointestinal problems:  1.Crohn's ileocolitis: Workup at [facility].Marland KitchenMarland KitchenPromethius IBD Serology 7 testing 02/2008 "pattern consistent with IBD.Marland KitchenMarland KitchenCrohn's." Colonoscopy 01/2008 showed terminal ileum and right colon ulcerations, pathology c/w acute and chronic ileitis, colitis, also HP polyps. Colonoscopy Path report from 2006 showed HP polyps. Office note 05/2009 documented previous C. diff colitis (treated empirically and also after toxin +). 2012 doing well on oral mesalamine 3.6 gms/day. ? Worsening of disease 03/2013, c. Diff PCR neg; increased mesalamine to 4.8gm daily. Colonoscopy 04/2012 found essentially normal colon but moderate to severe ileitis (path confirmed acute and chronic inflammation), started on 269m azathiaprine daily   Had erythema nodosum 2014, early, improved with steroids. 2. Dysphasia: EGD August, 2011 found Schatzki's ring that was dilated to 20 mm, small hiatal hernia, tortuous esophagus. Swallowing much improved. March 2012 PPI only every other day. Repeat EGD 03/2013 for dysphagia showed 4cn HH, tortuous distal esophagus, Schatzki's ring that was dilated to 281m   HPI: This is a  very pleasant 89ear old woman whom I last saw about 3 months ago.  Labs about a week ago WBC 3.8, otherwise cbc, cmet were normal.  She has BM twice in AM. No serious abd pains.  Sometime stool keeps form, sometimes not.  Non bloody.    Past Medical History  Diagnosis Date  . Hypertension   . Crohn's ileocolitis   . C. difficile diarrhea   . Schatzki's ring   . Hiatal hernia   . Allergy   . GERD (gastroesophageal reflux disease)     Past Surgical History  Procedure Laterality Date  . Tonsillectomy and adenoidectomy    . C section x3      x 3  . Cholecystectomy    . Appendectomy    . Wisdom tooth extraction    . Colonoscopy    . Polypectomy      Current Outpatient Prescriptions  Medication Sig Dispense Refill  . azaTHIOprine (IMURAN) 50 MG tablet Take 200 mg by  mouth daily.      . Marland KitchenALCIUM CITRATE PO Take 1 tablet by mouth 2 (two) times daily.      . Cholecalciferol (VITAMIN D-3 PO) Take 1 capsule by mouth at bedtime.      . Cyanocobalamin (VITAMIN B-12 PO) Take 1 tablet by mouth daily.      . fexofenadine (ALLEGRA) 180 MG tablet Take 180 mg by mouth daily.      . Marland Kitchenevocetirizine (XYZAL) 5 MG tablet Take 5 mg by mouth every evening.      . montelukast (SINGULAIR) 10 MG tablet Take 10 mg by mouth at bedtime.      . Multiple Vitamin (MULITIVITAMIN WITH MINERALS) TABS Take 1 tablet by mouth 2 (two) times daily.      . Omega-3 Fatty Acids (FISH OIL PO) Take 2 capsules by mouth 2 (two) times daily.        No current facility-administered medications for this visit.    Allergies as of 11/18/2012  . (No Known Allergies)    Family History  Problem Relation Age of Onset  . Heart disease Father   . Colon cancer Neg Hx   . Esophageal cancer Neg Hx   . Rectal cancer Neg Hx   . Stomach cancer Neg Hx     History   Social History  . Marital Status: Married    Spouse Name: N/A    Number of Children: 3  . Years of Education: N/A   Occupational History  . Retired TePharmacist, hospital  Social History Main Topics  . Smoking status: Never Smoker   . Smokeless tobacco: Never Used  . Alcohol Use: 0.0 oz/week    4-5 Glasses of wine, 4-5 Drinks containing 0.5 oz of alcohol per week     Comment: pt drinks 4-5 drinks only on the weekends  . Drug Use: No  . Sexually Active: Not on file   Other Topics Concern  . Not on file   Social History Narrative  . No narrative on file      Physical Exam: BP 110/70  Pulse 70  Ht 5' 7"  (1.702 m)  Wt 264 lb (119.75 kg)  BMI 41.34 kg/m2 Constitutional: generally well-appearing Psychiatric: alert and oriented x3 Abdomen: soft, nontender, nondistended, no obvious ascites, no peritoneal signs, normal bowel sounds     Assessment and plan: 57 y.o. female with Crohn's ileitis  She is doing very well from a bowel  perspective. She will continue on azathioprine 200 mg once daily. She will need repeat CBC and complete metabolic profile in 3-4 months and an office visit in about 5-6 months for now. She had bone density checked at her last visit as well as vitamin levels.

## 2012-11-18 NOTE — Patient Instructions (Addendum)
CBC, cmet in Mid November. Please return to see Dr. Ardis Hughs in January 2015, sooner if needed.                                               We are excited to introduce MyChart, a new best-in-class service that provides you online access to important information in your electronic medical record. We want to make it easier for you to view your health information - all in one secure location - when and where you need it. We expect MyChart will enhance the quality of care and service we provide.  When you register for MyChart, you can:    View your test results.    Request appointments and receive appointment reminders via email.    Request medication renewals.    View your medical history, allergies, medications and immunizations.    Communicate with your physician's office through a password-protected site.    Conveniently print information such as your medication lists.  To find out if MyChart is right for you, please talk to a member of our clinical staff today. We will gladly answer your questions about this free health and wellness tool.  If you are age 11 or older and want a member of your family to have access to your record, you must provide written consent by completing a proxy form available at our office. Please speak to our clinical staff about guidelines regarding accounts for patients younger than age 47.  As you activate your MyChart account and need any technical assistance, please call the MyChart technical support line at [phone]) or email your question to [email or web address]. If you email your question(s), please include your name, a return phone number and the best time to reach you.  If you have non-urgent health-related questions, you can send a message to our office through [place] at [email or web address]. If you have a medical emergency, call 911.  Thank you for using MyChart as your new health and wellness resource!   MyChart  licensed from [employer],  1999-2010. Patents Pending.

## 2012-11-25 ENCOUNTER — Telehealth: Payer: Self-pay

## 2012-11-25 NOTE — Telephone Encounter (Signed)
Message copied by Barron Alvine on Wed Nov 25, 2012  8:27 AM ------      Message from: Barron Alvine      Created: Tue Aug 25, 2012  9:53 AM       Pt to get labs  ------

## 2012-11-25 NOTE — Telephone Encounter (Signed)
The patient has been notified of this information and all questions answered.

## 2013-01-04 ENCOUNTER — Other Ambulatory Visit: Payer: Self-pay | Admitting: Gastroenterology

## 2013-02-18 ENCOUNTER — Other Ambulatory Visit: Payer: Self-pay

## 2013-02-18 ENCOUNTER — Telehealth: Payer: Self-pay

## 2013-02-18 NOTE — Telephone Encounter (Signed)
Pt has been notified to have labs and she states she has a follow up scheduled for Jan. 2015

## 2013-02-18 NOTE — Telephone Encounter (Signed)
Message copied by Barron Alvine on Thu Feb 18, 2013  8:08 AM ------      Message from: Barron Alvine      Created: Wed Nov 18, 2012  9:39 AM       Pt to get labs  ------

## 2013-03-03 ENCOUNTER — Ambulatory Visit (INDEPENDENT_AMBULATORY_CARE_PROVIDER_SITE_OTHER): Payer: BC Managed Care – PPO

## 2013-03-03 DIAGNOSIS — R933 Abnormal findings on diagnostic imaging of other parts of digestive tract: Secondary | ICD-10-CM

## 2013-03-03 LAB — COMPREHENSIVE METABOLIC PANEL
ALT: 12 U/L (ref 0–35)
BUN: 18 mg/dL (ref 6–23)
CO2: 26 mEq/L (ref 19–32)
Calcium: 9.5 mg/dL (ref 8.4–10.5)
Chloride: 106 mEq/L (ref 96–112)
Creatinine, Ser: 0.9 mg/dL (ref 0.4–1.2)
GFR: 66.55 mL/min (ref >60.00)
Total Bilirubin: 0.7 mg/dL (ref 0.3–1.2)

## 2013-03-03 LAB — CBC WITH DIFFERENTIAL/PLATELET
Basophils Absolute: 0 10*3/uL (ref 0.0–0.1)
Basophils Relative: 0.6 % (ref 0.0–3.0)
Eosinophils Absolute: 0.1 10*3/uL (ref 0.0–0.7)
HCT: 33.6 % — ABNORMAL LOW (ref 36.0–46.0)
Hemoglobin: 11.4 g/dL — ABNORMAL LOW (ref 12.0–15.0)
Lymphocytes Relative: 22.6 % (ref 12.0–46.0)
Lymphs Abs: 1 10*3/uL (ref 0.7–4.0)
MCHC: 34 g/dL (ref 30.0–36.0)
Monocytes Relative: 8.2 % (ref 3.0–12.0)
Neutro Abs: 3 10*3/uL (ref 1.4–7.7)
RBC: 3.51 Mil/uL — ABNORMAL LOW (ref 3.87–5.11)
RDW: 14 % (ref 11.5–14.6)

## 2013-03-04 ENCOUNTER — Other Ambulatory Visit: Payer: Self-pay | Admitting: Gastroenterology

## 2013-03-10 NOTE — Patient Instructions (Signed)
Pt has been scheduled for an office visit and repeat labs pt is aware

## 2013-03-10 NOTE — Progress Notes (Unsigned)
Error

## 2013-03-31 ENCOUNTER — Encounter (HOSPITAL_COMMUNITY): Payer: Self-pay | Admitting: Emergency Medicine

## 2013-03-31 ENCOUNTER — Emergency Department (INDEPENDENT_AMBULATORY_CARE_PROVIDER_SITE_OTHER)
Admission: EM | Admit: 2013-03-31 | Discharge: 2013-03-31 | Disposition: A | Discharge status: Home / Self Care | Payer: BC Managed Care – PPO | Source: Home / Self Care | Attending: Family Medicine | Admitting: Family Medicine

## 2013-03-31 DIAGNOSIS — S300XXA Contusion of lower back and pelvis, initial encounter: Secondary | ICD-10-CM

## 2013-03-31 DIAGNOSIS — S20229A Contusion of unspecified back wall of thorax, initial encounter: Secondary | ICD-10-CM

## 2013-03-31 MED ORDER — NAPROXEN 375 MG PO TABS: 375.0000 mg | ORAL_TABLET | Freq: Two times a day (BID) | ORAL | Status: DC

## 2013-03-31 MED ORDER — KETOROLAC TROMETHAMINE 30 MG/ML IJ SOLN
30.0000 mg | Freq: Once | INTRAMUSCULAR | Status: AC
  Administered 2013-03-31: 30 mg via INTRAMUSCULAR

## 2013-03-31 MED ORDER — KETOROLAC TROMETHAMINE 60 MG/2ML IM SOLN
INTRAMUSCULAR | Status: AC
Start: 2013-03-31 — End: 2013-03-31
  Filled 2013-03-31: qty 2

## 2013-03-31 NOTE — ED Notes (Signed)
C/o back pain due to falling  States her dog leash was wrapped around a tree when unwrapped him the leash was around his ankles and she end up falling backwards States pain is not radiating

## 2013-03-31 NOTE — ED Provider Notes (Signed)
CSN: [id number]     Arrival date & time 03/31/13  1638 History   None    Chief Complaint  Patient presents with  . Back Pain   (Consider location/radiation/quality/duration/timing/severity/associated sxs/prior Treatment) HPI Comments: Patient states she was walking her son's dog at about 3:30p this afternoon when both of her ankles became tangled in the dog's leash and she fell backwards landing on her buttocks and lower back. Denies head trauma or additional injury. She states initially she was having pain in her lower back, but she now states that her symptoms have begun to improve.   Patient is a 57 y.o. female presenting with fall. The history is provided by the patient.  Fall This is a new problem. The current episode started 3 to 5 hours ago. The problem has been gradually improving.    Past Medical History  Diagnosis Date  . Hypertension   . Crohn's ileocolitis   . C. difficile diarrhea   . Schatzki's ring   . Hiatal hernia   . Allergy   . GERD (gastroesophageal reflux disease)    Past Surgical History  Procedure Laterality Date  . Tonsillectomy and adenoidectomy    . C section x3      x 3  . Cholecystectomy    . Appendectomy    . Wisdom tooth extraction    . Colonoscopy    . Polypectomy     Family History  Problem Relation Age of Onset  . Heart disease Father   . Colon cancer Neg Hx   . Esophageal cancer Neg Hx   . Rectal cancer Neg Hx   . Stomach cancer Neg Hx    History  Substance Use Topics  . Smoking status: Never Smoker   . Smokeless tobacco: Never Used  . Alcohol Use: 0.0 oz/week    4-5 Glasses of wine, 4-5 Drinks containing 0.5 oz of alcohol per week     Comment: pt drinks 4-5 drinks only on the weekends   OB History   Grav Para Term Preterm Abortions TAB SAB Ect Mult Living                 Review of Systems  All other systems reviewed and are negative.    Allergies  Review of patient's allergies indicates no known allergies.  Home  Medications   Current Outpatient Rx  Name  Route  Sig  Dispense  Refill  . azaTHIOprine (IMURAN) 50 MG tablet   Oral   Take 200 mg by mouth daily.         Marland Kitchen azaTHIOprine (IMURAN) 50 MG tablet      TAKE 4 TABLETS (200 MG) DAILY   120 tablet   1   . azaTHIOprine (IMURAN) 50 MG tablet      TAKE 4 TABLETS BY MOUTH EVERY DAY   120 tablet   0   . CALCIUM CITRATE PO   Oral   Take 1 tablet by mouth 2 (two) times daily.         . Cholecalciferol (VITAMIN D-3 PO)   Oral   Take 1 capsule by mouth at bedtime.         . Cyanocobalamin (VITAMIN B-12 PO)   Oral   Take 1 tablet by mouth daily.         . fexofenadine (ALLEGRA) 180 MG tablet   Oral   Take 180 mg by mouth daily.         Marland Kitchen levocetirizine (XYZAL) 5 MG tablet  Oral   Take 5 mg by mouth every evening.         . montelukast (SINGULAIR) 10 MG tablet   Oral   Take 10 mg by mouth at bedtime.         . Multiple Vitamin (MULITIVITAMIN WITH MINERALS) TABS   Oral   Take 1 tablet by mouth 2 (two) times daily.         . naproxen (NAPROSYN) 375 MG tablet   Oral   Take 1 tablet (375 mg total) by mouth 2 (two) times daily. As needed for pain   20 tablet   0   . Omega-3 Fatty Acids (FISH OIL PO)   Oral   Take 2 capsules by mouth 2 (two) times daily.           BP 152/92  Pulse 93  Temp(Src) 98.4 F (36.9 C) (Oral)  Resp 18  SpO2 97% Physical Exam  Nursing note and vitals reviewed. Constitutional: She is oriented to person, place, and time. She appears well-developed and well-nourished. No distress.  +obese. Ambulatory without difficulty or assistance  HENT:  Head: Normocephalic and atraumatic.  Right Ear: External ear normal.  Left Ear: External ear normal.  Eyes: Conjunctivae are normal.  Neck: Normal range of motion. Neck supple.  Cardiovascular: Normal rate, regular rhythm and normal heart sounds.   Pulmonary/Chest: Effort normal and breath sounds normal.  Abdominal: Soft. Bowel sounds are  normal. There is no tenderness.  Musculoskeletal: Normal range of motion.       Back:       Feet:  Neurological: She is alert and oriented to person, place, and time. She has normal strength. No cranial nerve deficit or sensory deficit. She exhibits normal muscle tone. Coordination and gait normal. GCS eye subscore is 4. GCS verbal subscore is 5. GCS motor subscore is 6.  Ambulatory in exam room without difficulty  Skin: Skin is warm and dry. No rash noted.  No lacerations, abrasions, erythema or ecchymosis  Psychiatric: She has a normal mood and affect. Her behavior is normal.    ED Course  Procedures (including critical care time) Labs Review Labs Reviewed - No data to display Imaging Review No results found.  EKG Interpretation    Date/Time:    Ventricular Rate:    PR Interval:    QRS Duration:   QT Interval:    QTC Calculation:   R Axis:     Text Interpretation:              MDM   1. Lumbar contusion, initial encounter    Patient reports in the exam room that much of her discomfort has resolved at time of exam. Advised her to use naprosyn as prescribed because she may find that come tomorrow she is stiff and sore from the fall. Patient declines LS spine films stating she is feeling better.    [place], [place] 04/01/13 [phone]

## 2013-04-01 NOTE — ED Provider Notes (Signed)
Medical screening examination/treatment/procedure(s) were performed by resident physician or non-physician practitioner and as supervising physician I was immediately available for consultation/collaboration.   Pauline Good MD.   Billy Fischer, MD 04/01/13 [phone]

## 2013-04-10 ENCOUNTER — Other Ambulatory Visit: Payer: Self-pay | Admitting: Gastroenterology

## 2013-04-26 ENCOUNTER — Other Ambulatory Visit (INDEPENDENT_AMBULATORY_CARE_PROVIDER_SITE_OTHER): Payer: BC Managed Care – PPO

## 2013-04-26 DIAGNOSIS — R933 Abnormal findings on diagnostic imaging of other parts of digestive tract: Secondary | ICD-10-CM

## 2013-04-26 LAB — CBC WITH DIFFERENTIAL/PLATELET
Basophils Absolute: 0 10*3/uL (ref 0.0–0.1)
Basophils Relative: 0.7 % (ref 0.0–3.0)
Eosinophils Absolute: 0.1 10*3/uL (ref 0.0–0.7)
Eosinophils Relative: 1.4 % (ref 0.0–5.0)
HEMATOCRIT: 34.6 % — AB (ref 36.0–46.0)
HEMOGLOBIN: 11.7 g/dL — AB (ref 12.0–15.0)
LYMPHS ABS: 1.1 10*3/uL (ref 0.7–4.0)
Lymphocytes Relative: 21.2 % (ref 12.0–46.0)
MCHC: 33.7 g/dL (ref 30.0–36.0)
MCV: 95.1 fl (ref 78.0–100.0)
MONOS PCT: 6.4 % (ref 3.0–12.0)
Monocytes Absolute: 0.3 10*3/uL (ref 0.1–1.0)
NEUTROS ABS: 3.7 10*3/uL (ref 1.4–7.7)
Neutrophils Relative %: 70.3 % (ref 43.0–77.0)
Platelets: 387 10*3/uL (ref 150.0–400.0)
RBC: 3.64 Mil/uL — ABNORMAL LOW (ref 3.87–5.11)
RDW: 15.2 % — AB (ref 11.5–14.6)
WBC: 5.2 10*3/uL (ref 4.5–10.5)

## 2013-04-26 LAB — COMPREHENSIVE METABOLIC PANEL
ALT: 14 U/L (ref 0–35)
AST: 17 U/L (ref 0–37)
Albumin: 3.8 g/dL (ref 3.5–5.2)
Alkaline Phosphatase: 44 U/L (ref 39–117)
BUN: 26 mg/dL — ABNORMAL HIGH (ref 6–23)
CO2: 23 meq/L (ref 19–32)
CREATININE: 1.1 mg/dL (ref 0.4–1.2)
Calcium: 9.6 mg/dL (ref 8.4–10.5)
Chloride: 105 mEq/L (ref 96–112)
GFR: 55.87 mL/min — AB (ref >60.00)
Glucose, Bld: 87 mg/dL (ref 70–99)
Potassium: 4.5 mEq/L (ref 3.5–5.1)
Sodium: 138 mEq/L (ref 135–145)
Total Bilirubin: 0.6 mg/dL (ref 0.3–1.2)
Total Protein: 7.4 g/dL (ref 6.0–8.3)

## 2013-04-27 ENCOUNTER — Ambulatory Visit (INDEPENDENT_AMBULATORY_CARE_PROVIDER_SITE_OTHER): Payer: BC Managed Care – PPO | Admitting: Gastroenterology

## 2013-04-27 ENCOUNTER — Encounter: Payer: Self-pay | Admitting: Gastroenterology

## 2013-04-27 VITALS — BP 114/76 | HR 80 | Ht 67.0 in | Wt 267.0 lb

## 2013-04-27 DIAGNOSIS — K5000 Crohn's disease of small intestine without complications: Secondary | ICD-10-CM

## 2013-04-27 NOTE — Patient Instructions (Addendum)
Stop your fiber supplements  Call to report on your response in 4 weeks.   Continue azathiaprine 222m once daily.  Your physician has requested that you go to the basement for the following lab work 08/25/13: CBC, CMET  Please return to see Dr. JArdis Hughsin 4 months, shortly after the labs.

## 2013-04-27 NOTE — Progress Notes (Signed)
Review of gastrointestinal problems:  1.Crohn's ileocolitis: Workup at [facility].Marland KitchenMarland KitchenPromethius IBD Serology 7 testing 02/2008 "pattern consistent with IBD.Marland KitchenMarland KitchenCrohn's." Colonoscopy 01/2008 showed terminal ileum and right colon ulcerations, pathology c/w acute and chronic ileitis, colitis, also HP polyps. Colonoscopy Path report from 2006 showed HP polyps. Office note 05/2009 documented previous C. diff colitis (treated empirically and also after toxin +). 2012 doing well on oral mesalamine 3.6 gms/day. ? Worsening of disease 03/2013, c. Diff PCR neg; increased mesalamine to 4.8gm daily. Colonoscopy 04/2012 found essentially normal colon but moderate to severe ileitis (path confirmed acute and chronic inflammation), started on 225m azathiaprine daily Had erythema nodosum 2014, early, improved with steroids. 11/2012 doing very well on 200 azathiaprine daily. 2. Dysphasia: EGD August, 2011 found Schatzki's ring that was dilated to 20 mm, small hiatal hernia, tortuous esophagus. Swallowing much improved. March 2012 PPI only every other day. Repeat EGD 03/2013 for dysphagia showed 4cn HH, tortuous distal esophagus, Schatzki's ring that was dilated to 229m    HPI: This is a  very pleasant [age]ear old woman whom I last saw several months ago. She is here with her granddaughter today.   Bowels are inconsistent. Sometimes no BM at all. Sometimes she has to strain to have BM.  Soft BMs.  Has urgency to go, but has to strain and then only soft stool.  Usually just mush that comes out.  This has been going on for 2 months.   Past Medical History  Diagnosis Date  . Hypertension   . Crohn's ileocolitis   . C. difficile diarrhea   . Schatzki's ring   . Hiatal hernia   . Allergy   . GERD (gastroesophageal reflux disease)     Past Surgical History  Procedure Laterality Date  . Tonsillectomy and adenoidectomy    . C section x3      x 3  . Cholecystectomy    . Appendectomy    . Wisdom tooth extraction    .  Colonoscopy    . Polypectomy      Current Outpatient Prescriptions  Medication Sig Dispense Refill  . azaTHIOprine (IMURAN) 50 MG tablet TAKE 4 TABLETS BY MOUTH EVERY DAY  120 tablet  0  . CALCIUM CITRATE PO Take 1 tablet by mouth 2 (two) times daily.      . Cholecalciferol (VITAMIN D-3 PO) Take 1 capsule by mouth at bedtime.      . Cyanocobalamin (VITAMIN B-12 PO) Take 1 tablet by mouth daily.      . fexofenadine (ALLEGRA) 180 MG tablet Take 180 mg by mouth daily.      . Marland Kitchenevocetirizine (XYZAL) 5 MG tablet Take 5 mg by mouth every evening.      . montelukast (SINGULAIR) 10 MG tablet Take 10 mg by mouth at bedtime.      . Multiple Vitamin (MULITIVITAMIN WITH MINERALS) TABS Take 1 tablet by mouth 2 (two) times daily.      . Omega-3 Fatty Acids (FISH OIL PO) Take 2 capsules by mouth 2 (two) times daily.        No current facility-administered medications for this visit.    Allergies as of 04/27/2013  . (No Known Allergies)    Family History  Problem Relation Age of Onset  . Heart disease Father   . Colon cancer Neg Hx   . Esophageal cancer Neg Hx   . Rectal cancer Neg Hx   . Stomach cancer Neg Hx     History   Social History  .  Marital Status: Married    Spouse Name: N/A    Number of Children: 3  . Years of Education: N/A   Occupational History  . Retired Pharmacist, hospital    Social History Main Topics  . Smoking status: Never Smoker   . Smokeless tobacco: Never Used  . Alcohol Use: 0.0 oz/week    4-5 Glasses of wine, 4-5 Drinks containing 0.5 oz of alcohol per week     Comment: pt drinks 4-5 drinks only on the weekends  . Drug Use: No  . Sexual Activity: Not on file   Other Topics Concern  . Not on file   Social History Narrative  . No narrative on file      Physical Exam: BP 114/76  Pulse 80  Ht 5' 7"  (1.702 m)  Wt 267 lb (121.11 kg)  BMI 41.81 kg/m2 Constitutional: generally well-appearing Psychiatric: alert and oriented x3 Abdomen: soft, nontender,  nondistended, no obvious ascites, no peritoneal signs, normal bowel sounds Rectal exam with female assistant in the room: normal exam, no fissure, no stricture, stool brown and not checked for hemocult.    Assessment and plan: 58 y.o. female with  urgency and straining but soft stools  she has no stricturing or clinical signs of inflammation. A normally. Her Crohn's  was mainly confined to her terminal ileum 1 year ago by colonoscopy. CBC and complete metabolic profile yesterday were both essentially normal I don't think she is an active inflammatory process here. Think this is more possibly functional. She does take fiber supplements daily and aspirin. These for now to call to see how she responds in 3-4 weeks. She will need repeat labs in 4 months and a followup appointment shortly after that.

## 2013-05-11 ENCOUNTER — Other Ambulatory Visit: Payer: Self-pay | Admitting: Gastroenterology

## 2013-06-09 ENCOUNTER — Encounter: Payer: Self-pay | Admitting: Internal Medicine

## 2013-06-09 ENCOUNTER — Other Ambulatory Visit: Payer: Self-pay | Admitting: Gastroenterology

## 2013-06-09 ENCOUNTER — Ambulatory Visit: Payer: BC Managed Care – PPO | Attending: Internal Medicine | Admitting: Internal Medicine

## 2013-06-09 VITALS — BP 119/79 | HR 80 | Temp 98.5°F | Resp 16 | Ht 67.5 in | Wt 258.0 lb

## 2013-06-09 DIAGNOSIS — K219 Gastro-esophageal reflux disease without esophagitis: Secondary | ICD-10-CM | POA: Insufficient documentation

## 2013-06-09 DIAGNOSIS — K5090 Crohn's disease, unspecified, without complications: Secondary | ICD-10-CM

## 2013-06-09 DIAGNOSIS — Q393 Congenital stenosis and stricture of esophagus: Secondary | ICD-10-CM

## 2013-06-09 DIAGNOSIS — K5080 Crohn's disease of both small and large intestine without complications: Secondary | ICD-10-CM | POA: Insufficient documentation

## 2013-06-09 DIAGNOSIS — K449 Diaphragmatic hernia without obstruction or gangrene: Secondary | ICD-10-CM | POA: Insufficient documentation

## 2013-06-09 DIAGNOSIS — M25569 Pain in unspecified knee: Secondary | ICD-10-CM | POA: Insufficient documentation

## 2013-06-09 DIAGNOSIS — Q391 Atresia of esophagus with tracheo-esophageal fistula: Secondary | ICD-10-CM | POA: Insufficient documentation

## 2013-06-09 DIAGNOSIS — M25561 Pain in right knee: Secondary | ICD-10-CM

## 2013-06-09 DIAGNOSIS — I10 Essential (primary) hypertension: Secondary | ICD-10-CM | POA: Insufficient documentation

## 2013-06-09 LAB — LIPID PANEL
Cholesterol: 234 mg/dL — ABNORMAL HIGH (ref 0–200)
HDL: 72 mg/dL
LDL Cholesterol: 143 mg/dL — ABNORMAL HIGH (ref 0–99)
Total CHOL/HDL Ratio: 3.3 ratio
Triglycerides: 93 mg/dL
VLDL: 19 mg/dL (ref 0–40)

## 2013-06-09 LAB — TSH: TSH: 2.394 u[IU]/mL (ref 0.350–4.500)

## 2013-06-09 MED ORDER — DICLOFENAC SODIUM 1 % TD GEL: 2.0000 g | Freq: Four times a day (QID) | TRANSDERMAL | Status: DC

## 2013-06-09 MED ORDER — ACETAMINOPHEN-CODEINE #3 300-30 MG PO TABS: 1.0000 | ORAL_TABLET | ORAL | Status: DC | PRN

## 2013-06-09 NOTE — Patient Instructions (Signed)
Knee Pain Knee pain can be a result of an injury or other medical conditions. Treatment will depend on the cause of your pain. HOME CARE  Only take medicine as told by your doctor.  Keep a healthy weight. Being overweight can make the knee hurt more.  Stretch before exercising or playing sports.  If there is constant knee pain, change the way you exercise. Ask your doctor for advice.  Make sure shoes fit well. Choose the right shoe for the sport or activity.  Protect your knees. Wear kneepads if needed.  Rest when you are tired. GET HELP RIGHT AWAY IF:   Your knee pain does not stop.  Your knee pain does not get better.  Your knee joint feels hot to the touch.  You have a fever. MAKE SURE YOU:   Understand these instructions.  Will watch this condition.  Will get help right away if you are not doing well or get worse. Document Released: 06/28/2008 Document Revised: 06/24/2011 Document Reviewed: 06/28/2008 [facility] Patient Information 2014 [place], [place].

## 2013-06-09 NOTE — Progress Notes (Signed)
Patient ID: Connie Porter, female   DOB: 08/26/1955, 58 y.o.   MRN: [id number]   Connie Porter, is a 58 y.o. female  JQB:[id number]  [id number]  DOB - 1956/02/10  CC:  Chief Complaint  Patient presents with  . Establish Care       HPI: Connie Porter is a 58 y.o. female here today to establish medical care. She is a pleasant woman with history of Crohn's ileocolitis, Schatzki's ring, GERD and seasonal allergy here today with a complaint of right knee pain. He claims the right knee has been painful for over 3 months but now getting worse despite analgesics with ibuprofen. There is no significant swelling. Sometimes in December she fell while walking her dog, the right knee pain worse since then. No fever. No pain and left knee joint or other joints. Her Crohn's disease in remission, she is on azathioprine following up with GI specialist. Her blood pressure is within normal. Her recent labs are within normal limit. Her colonoscopy is up-to-date she has been scheduled for mammogram and Pap smear in March 2015. She does not smoke cigarette, she does not drink alcohol. Her medication lists as below Patient has No headache, No chest pain, No abdominal pain - No Nausea, No new weakness tingling or numbness, No Cough - SOB.  No Known Allergies Past Medical History  Diagnosis Date  . Hypertension   . Crohn's ileocolitis   . C. difficile diarrhea   . Schatzki's ring   . Hiatal hernia   . Allergy   . GERD (gastroesophageal reflux disease)    Current Outpatient Prescriptions on File Prior to Visit  Medication Sig Dispense Refill  . CALCIUM CITRATE PO Take 1 tablet by mouth 2 (two) times daily.      . Cholecalciferol (VITAMIN D-3 PO) Take 1 capsule by mouth at bedtime.      . Cyanocobalamin (VITAMIN B-12 PO) Take 1 tablet by mouth daily.      . fexofenadine (ALLEGRA) 180 MG tablet Take 180 mg by mouth daily.      Marland Kitchen levocetirizine (XYZAL) 5 MG tablet Take 5 mg by mouth every evening.      .  montelukast (SINGULAIR) 10 MG tablet Take 10 mg by mouth at bedtime.      . Multiple Vitamin (MULITIVITAMIN WITH MINERALS) TABS Take 1 tablet by mouth 2 (two) times daily.      . Omega-3 Fatty Acids (FISH OIL PO) Take 2 capsules by mouth 2 (two) times daily.       Marland Kitchen azaTHIOprine (IMURAN) 50 MG tablet TAKE 4 TABLETS BY MOUTH EVERY DAY  120 tablet  0   No current facility-administered medications on file prior to visit.   Family History  Problem Relation Age of Onset  . Heart disease Father   . Colon cancer Neg Hx   . Esophageal cancer Neg Hx   . Rectal cancer Neg Hx   . Stomach cancer Neg Hx    History   Social History  . Marital Status: Married    Spouse Name: N/A    Number of Children: 3  . Years of Education: N/A   Occupational History  . Retired Pharmacist, hospital    Social History Main Topics  . Smoking status: Never Smoker   . Smokeless tobacco: Never Used  . Alcohol Use: 0.0 oz/week    4-5 Glasses of wine, 4-5 Drinks containing 0.5 oz of alcohol per week     Comment: pt drinks 4-5 drinks only on the weekends  .  Drug Use: No  . Sexual Activity: Not on file   Other Topics Concern  . Not on file   Social History Narrative  . No narrative on file    Review of Systems: Constitutional: Negative for fever, chills, diaphoresis, activity change, appetite change and fatigue. HENT: Negative for ear pain, nosebleeds, congestion, facial swelling, rhinorrhea, neck pain, neck stiffness and ear discharge.  Eyes: Negative for pain, discharge, redness, itching and visual disturbance. Respiratory: Negative for cough, choking, chest tightness, shortness of breath, wheezing and stridor.  Cardiovascular: Negative for chest pain, palpitations and leg swelling. Gastrointestinal: Negative for abdominal distention. Genitourinary: Negative for dysuria, urgency, frequency, hematuria, flank pain, decreased urine volume, difficulty urinating and dyspareunia.  Musculoskeletal: Negative for back pain,  joint swelling, arthralgia and gait problem. Neurological: Negative for dizziness, tremors, seizures, syncope, facial asymmetry, speech difficulty, weakness, light-headedness, numbness and headaches.  Hematological: Negative for adenopathy. Does not bruise/bleed easily. Psychiatric/Behavioral: Negative for hallucinations, behavioral problems, confusion, dysphoric mood, decreased concentration and agitation.    Objective:   Filed Vitals:   06/09/13 1038  BP: 119/79  Pulse: 80  Temp: 98.5 F (36.9 C)  Resp: 16    Physical Exam: Constitutional: Patient appears well-developed and well-nourished. No distress. HENT: Normocephalic, atraumatic, External right and left ear normal. Oropharynx is clear and moist.  Eyes: Conjunctivae and EOM are normal. PERRLA, no scleral icterus. Neck: Normal ROM. Neck supple. No JVD. No tracheal deviation. No thyromegaly. CVS: RRR, S1/S2 +, no murmurs, no gallops, no carotid bruit.  Pulmonary: Effort and breath sounds normal, no stridor, rhonchi, wheezes, rales.  Abdominal: Soft. BS +, no distension, tenderness, rebound or guarding.  Musculoskeletal: Normal range of motion. No edema and no tenderness.  Lymphadenopathy: No lymphadenopathy noted, cervical, inguinal or axillary Neuro: Alert. Normal reflexes, muscle tone coordination. No cranial nerve deficit. Skin: Skin is warm and dry. No rash noted. Not diaphoretic. No erythema. No pallor. Psychiatric: Normal mood and affect. Behavior, judgment, thought content normal.  Lab Results  Component Value Date   WBC 5.2 04/26/2013   HGB 11.7* 04/26/2013   HCT 34.6* 04/26/2013   MCV 95.1 04/26/2013   PLT 387.0 04/26/2013   Lab Results  Component Value Date   CREATININE 1.1 04/26/2013   BUN 26* 04/26/2013   NA 138 04/26/2013   K 4.5 04/26/2013   CL 105 04/26/2013   CO2 23 04/26/2013    No results found for this basename: HGBA1C   Lipid Panel  No results found for this basename: chol, trig, hdl, cholhdl, vldl,  ldlcalc       Assessment and plan:   1. Right knee pain  - DG Knee Complete 4 Views Right; Future  - acetaminophen-codeine (TYLENOL #3) 300-30 MG per tablet; Take 1 tablet by mouth every 4 (four) hours as needed.  Dispense: 60 tablet; Refill: 0 - diclofenac sodium (VOLTAREN) 1 % GEL; Apply 2 g topically 4 (four) times daily.  Dispense: 1 Tube; Refill: 1  2. Crohn's disease Continue followup with the gastroenterologist as scheduled  3. Morbid obesity  - TSH - Lipid panel  Patient extensively counseled about nutrition and exercise  Return in about 3 months (around 09/06/2013), or if symptoms worsen or fail to improve, for Routine Follow Up.  The patient was given clear instructions to go to ER or return to medical center if symptoms don't improve, worsen or new problems develop. The patient verbalized understanding. The patient was told to call to get lab results if they haven't heard  anything in the next week.     Angelica Chessman, MD, [place], [place], [place], [place]   06/09/2013, 11:41 AM

## 2013-06-09 NOTE — Progress Notes (Signed)
Pt is here to establish care. For about 3 months pt's right knee has been in constant pain.

## 2013-06-10 DIAGNOSIS — M25561 Pain in right knee: Secondary | ICD-10-CM | POA: Insufficient documentation

## 2013-06-11 ENCOUNTER — Telehealth: Payer: Self-pay

## 2013-06-11 ENCOUNTER — Ambulatory Visit (HOSPITAL_COMMUNITY)
Admission: RE | Admit: 2013-06-11 | Discharge: 2013-06-11 | Disposition: A | Discharge status: Home / Self Care | Payer: BC Managed Care – PPO | Source: Ambulatory Visit | Attending: Internal Medicine | Admitting: Internal Medicine

## 2013-06-11 DIAGNOSIS — M25569 Pain in unspecified knee: Secondary | ICD-10-CM | POA: Insufficient documentation

## 2013-06-11 DIAGNOSIS — M25561 Pain in right knee: Secondary | ICD-10-CM

## 2013-06-11 MED ORDER — PRAVASTATIN SODIUM 20 MG PO TABS: 20.0000 mg | ORAL_TABLET | Freq: Every day | ORAL | Status: DC

## 2013-06-11 NOTE — Telephone Encounter (Signed)
Patient is aware of her lab results [facility]cription [facility] on [facility]

## 2013-06-11 NOTE — Telephone Encounter (Signed)
Message copied by Dorothe Pea on Fri Jun 11, 2013  3:35 PM ------      Message from: Angelica Chessman E      Created: Thu Jun 10, 2013  5:10 PM       Please inform patient that her thyroid function is normal but her cholesterol level is high. Will start her on medication, we also encouraged her dietary control including low-cholesterol low-fat diet and regular physical exercise of at least 3 times a week 30 minutes each time as tolerated            Please call in pravastatin 20 mg tablet by mouth daily, 90 tablets with 3 refills ------

## 2013-06-14 ENCOUNTER — Telehealth: Payer: Self-pay | Admitting: *Deleted

## 2013-06-14 NOTE — Telephone Encounter (Signed)
I spoke to pt and informed her of her x ray results.

## 2013-06-14 NOTE — Telephone Encounter (Signed)
Message copied by Joan Mayans on Mon Jun 14, 2013 10:59 AM ------      Message from: Tresa Garter      Created: Fri Jun 11, 2013  4:30 PM       Please inform patient that her Right knee X Ray Showed Osteoarthritis. No fracture or dislocation seen. ------

## 2013-07-06 ENCOUNTER — Telehealth: Payer: Self-pay | Admitting: Internal Medicine

## 2013-07-06 NOTE — Telephone Encounter (Signed)
Pt calling because she needs refill for diclofenac sodium (VOLTAREN) 1 % GEL but pharmacy refuses to fill until they receive prior authorization for [facility] for coverage. Please f/u with pt, pt has [employer],  [phone].

## 2013-07-07 ENCOUNTER — Other Ambulatory Visit: Payer: Self-pay | Admitting: Emergency Medicine

## 2013-07-07 ENCOUNTER — Other Ambulatory Visit: Payer: Self-pay | Admitting: Gastroenterology

## 2013-07-07 NOTE — Telephone Encounter (Signed)
Spoke with representative from [facility]. Prior authorization approved until 06/07/13-07/07/14 Case ID # [id number]. I will update with [facility]

## 2013-08-14 ENCOUNTER — Other Ambulatory Visit: Payer: Self-pay | Admitting: Internal Medicine

## 2013-08-14 ENCOUNTER — Other Ambulatory Visit: Payer: Self-pay | Admitting: Gastroenterology

## 2013-09-01 ENCOUNTER — Other Ambulatory Visit (INDEPENDENT_AMBULATORY_CARE_PROVIDER_SITE_OTHER): Payer: BC Managed Care – PPO

## 2013-09-01 DIAGNOSIS — K5000 Crohn's disease of small intestine without complications: Secondary | ICD-10-CM

## 2013-09-01 LAB — CBC WITH DIFFERENTIAL/PLATELET
Basophils Absolute: 0 10*3/uL (ref 0.0–0.1)
Basophils Relative: 0.2 % (ref 0.0–3.0)
Eosinophils Absolute: 0.1 10*3/uL (ref 0.0–0.7)
Eosinophils Relative: 1.4 % (ref 0.0–5.0)
HEMATOCRIT: 39.5 % (ref 36.0–46.0)
Hemoglobin: 13.3 g/dL (ref 12.0–15.0)
Lymphocytes Relative: 17.5 % (ref 12.0–46.0)
Lymphs Abs: 0.8 10*3/uL (ref 0.7–4.0)
MCHC: 33.6 g/dL (ref 30.0–36.0)
MCV: 97.9 fl (ref 78.0–100.0)
Monocytes Absolute: 0.3 10*3/uL (ref 0.1–1.0)
Monocytes Relative: 6.2 % (ref 3.0–12.0)
Neutro Abs: 3.4 10*3/uL (ref 1.4–7.7)
Neutrophils Relative %: 74.7 % (ref 43.0–77.0)
PLATELETS: 342 10*3/uL (ref 150.0–400.0)
RBC: 4.04 Mil/uL (ref 3.87–5.11)
RDW: 15.1 % (ref 11.5–15.5)
WBC: 4.5 10*3/uL (ref 4.0–10.5)

## 2013-09-01 LAB — COMPREHENSIVE METABOLIC PANEL
ALBUMIN: 4.1 g/dL (ref 3.5–5.2)
ALT: 12 U/L (ref 0–35)
AST: 19 U/L (ref 0–37)
Alkaline Phosphatase: 46 U/L (ref 39–117)
BUN: 29 mg/dL — ABNORMAL HIGH (ref 6–23)
CALCIUM: 9.8 mg/dL (ref 8.4–10.5)
CO2: 25 mEq/L (ref 19–32)
Chloride: 105 mEq/L (ref 96–112)
Creatinine, Ser: 1.1 mg/dL (ref 0.4–1.2)
GFR: 54.63 mL/min — AB (ref >60.00)
Glucose, Bld: 93 mg/dL (ref 70–99)
Potassium: 4.5 mEq/L (ref 3.5–5.1)
SODIUM: 139 meq/L (ref 135–145)
Total Bilirubin: 0.5 mg/dL (ref 0.2–1.2)
Total Protein: 7.7 g/dL (ref 6.0–8.3)

## 2013-09-08 ENCOUNTER — Other Ambulatory Visit: Payer: Self-pay | Admitting: Gastroenterology

## 2013-09-14 ENCOUNTER — Encounter: Payer: Self-pay | Admitting: Gastroenterology

## 2013-09-14 ENCOUNTER — Ambulatory Visit (INDEPENDENT_AMBULATORY_CARE_PROVIDER_SITE_OTHER): Admitting: Gastroenterology

## 2013-09-14 ENCOUNTER — Ambulatory Visit: Payer: BC Managed Care – PPO | Admitting: Internal Medicine

## 2013-09-14 ENCOUNTER — Ambulatory Visit: Attending: Internal Medicine

## 2013-09-14 VITALS — BP 100/70 | HR 76 | Ht 67.5 in | Wt 243.0 lb

## 2013-09-14 DIAGNOSIS — M1990 Unspecified osteoarthritis, unspecified site: Secondary | ICD-10-CM

## 2013-09-14 DIAGNOSIS — K5090 Crohn's disease, unspecified, without complications: Secondary | ICD-10-CM

## 2013-09-14 MED ORDER — TRAMADOL HCL 50 MG PO TABS: 50.0000 mg | ORAL_TABLET | Freq: Three times a day (TID) | ORAL | Status: DC | PRN

## 2013-09-14 MED ORDER — AZATHIOPRINE 100 MG PO TABS: 200.0000 mg | ORAL_TABLET | Freq: Every day | ORAL | Status: DC

## 2013-09-14 NOTE — Progress Notes (Signed)
Review of gastrointestinal problems:  1.Crohn's ileocolitis: Workup at [facility].Marland KitchenMarland [facility] IBD Serology 7 testing 02/2008 "pattern consistent with IBD.Marland KitchenMarland KitchenCrohn's." Colonoscopy 01/2008 showed terminal ileum and right colon ulcerations, pathology c/w acute and chronic ileitis, colitis, also HP polyps. Colonoscopy Path report from 2006 showed HP polyps. Office note 05/2009 documented previous C. diff colitis (treated empirically and also after toxin +). 2012 doing well on oral mesalamine 3.6 gms/day. ? Worsening of disease 03/2013, c. Diff PCR neg; increased mesalamine to 4.8gm daily. Colonoscopy 04/2012 found essentially normal colon but moderate to severe ileitis (path confirmed acute and chronic inflammation), started on 275m azathiaprine daily Had erythema nodosum 2014, early, improved with steroids. 11/2012 doing very well on 200 azathiaprine daily.  04/2013: urgency, straining: felt to be more functional then IBD related; fiber supplements stopped. 2. Dysphasia: EGD August, 2011 found Schatzki's ring that was dilated to 20 mm, small hiatal hernia, tortuous esophagus. Swallowing much improved. March 2012 PPI only every other day. Repeat EGD 03/2013 for dysphagia showed 4cn HH, tortuous distal esophagus, Schatzki's ring that was dilated to 236m    HPI: This is a  very pleasant [age]ear old woman whom I last saw about 4 months ago.  Stopped fiber 4 months ago, but was very bothered with worse straining and so restarted.  Takes citrucel tablets.  Never sees blood.  The   Has lost 25 pounds since January.  Real diet changes since January.  She has noted on the days when she cheats on her diet that her straining and urgency are gone.   Past Medical History  Diagnosis Date  . Hypertension   . Crohn's ileocolitis   . C. difficile diarrhea   . Schatzki's ring   . Hiatal hernia   . Allergy   . GERD (gastroesophageal reflux disease)     Past Surgical History  Procedure Laterality Date  .  Tonsillectomy and adenoidectomy    . C section x3      x 3  . Cholecystectomy    . Appendectomy    . Wisdom tooth extraction    . Colonoscopy    . Polypectomy      Current Outpatient Prescriptions  Medication Sig Dispense Refill  . acetaminophen-codeine (TYLENOL #3) 300-30 MG per tablet Take 1 tablet by mouth every 4 (four) hours as needed.  60 tablet  0  . azaTHIOprine (IMURAN) 50 MG tablet TAKE 4 TABLETS BY MOUTH EVERY DAY  120 tablet  0  . CALCIUM CITRATE PO Take 1 tablet by mouth 2 (two) times daily.      . Cholecalciferol (VITAMIN D-3 PO) Take 1 capsule by mouth at bedtime.      . Cyanocobalamin (VITAMIN B-12 PO) Take 1 tablet by mouth daily.      . fexofenadine (ALLEGRA) 180 MG tablet Take 180 mg by mouth daily.      . Marland Kitchenevocetirizine (XYZAL) 5 MG tablet Take 5 mg by mouth every evening.      . montelukast (SINGULAIR) 10 MG tablet Take 10 mg by mouth at bedtime.      . Multiple Vitamin (MULITIVITAMIN WITH MINERALS) TABS Take 1 tablet by mouth 2 (two) times daily.      . Omega-3 Fatty Acids (FISH OIL PO) Take 2 capsules by mouth 2 (two) times daily.       . pravastatin (PRAVACHOL) 20 MG tablet Take 1 tablet (20 mg total) by mouth daily.  90 tablet  3  . VOLTAREN 1 % GEL APPLY 2G TO AFFECTED AREA ON  SKIN 4 TIMES DAILY  100 g  1   No current facility-administered medications for this visit.    Allergies as of 09/14/2013  . (No Known Allergies)    Family History  Problem Relation Age of Onset  . Heart disease Father   . Colon cancer Neg Hx   . Esophageal cancer Neg Hx   . Rectal cancer Neg Hx   . Stomach cancer Neg Hx     History   Social History  . Marital Status: Married    Spouse Name: N/A    Number of Children: 3  . Years of Education: N/A   Occupational History  . Retired Pharmacist, hospital    Social History Main Topics  . Smoking status: Never Smoker   . Smokeless tobacco: Never Used  . Alcohol Use: 0.0 oz/week    4-5 Glasses of wine, 4-5 Drinks containing 0.5 oz  of alcohol per week     Comment: pt drinks 4-5 drinks only on the weekends  . Drug Use: No  . Sexual Activity: Not on file   Other Topics Concern  . Not on file   Social History Narrative  . No narrative on file      Physical Exam: BP 100/70  Pulse 76  Ht 5' 7.5" (1.715 m)  Wt 243 lb (110.224 kg)  BMI 37.48 kg/m2 Constitutional: generally well-appearing Psychiatric: alert and oriented x3 Abdomen: soft, nontender, nondistended, no obvious ascites, no peritoneal signs, normal bowel sounds     Assessment and plan: 58 y.o. female with Crohn's ileocolitis, also functional food related bowel changes  She is very clear that certain foods tend to cause her to have some straining and urgency. I do not think she has significantly active Crohn's disease. She will stay on azathioprine 200 mg per day. She is going to change her dietary fiber to a new form. If this is unhelpful she will try MiraLax powder one dose daily. She will return to see me in 3 months with a set of labs shortly beforehand.

## 2013-09-14 NOTE — Patient Instructions (Addendum)
Continue to take prescribed Voltaren Gel with pain medication. Referral placed for Sports Medicine  If symptoms don't resolve, please return to clinic Pt informed don't take Tylenol #3 with Tramadol

## 2013-09-14 NOTE — Patient Instructions (Addendum)
Please start taking citrucel (orange flavored) powder fiber supplement.  This may cause some bloating at first but that usually goes away. Begin with a small spoonful and work your way up to a large, heaping spoonful daily over a week. If this isn't helpful, then try once daily dose of miralax. Repeat labs (cbc, cmet) in 3 months. Please return to see Dr. Ardis Hughs in 3 months, shortly after your labs.  Please call to schedule this appointment around July 1.  [phone] New prescription written for azathiaprine (stay on 2107m once daily).

## 2013-09-24 ENCOUNTER — Encounter: Payer: Self-pay | Admitting: Family Medicine

## 2013-09-24 ENCOUNTER — Ambulatory Visit (INDEPENDENT_AMBULATORY_CARE_PROVIDER_SITE_OTHER): Admitting: Family Medicine

## 2013-09-24 VITALS — BP 116/82 | Ht 67.5 in | Wt 240.0 lb

## 2013-09-24 DIAGNOSIS — M25569 Pain in unspecified knee: Secondary | ICD-10-CM

## 2013-09-24 DIAGNOSIS — M25561 Pain in right knee: Secondary | ICD-10-CM

## 2013-09-24 MED ORDER — METHYLPREDNISOLONE ACETATE 40 MG/ML IJ SUSP
40.0000 mg | Freq: Once | INTRAMUSCULAR | Status: AC
  Administered 2013-09-24: 40 mg via INTRA_ARTICULAR

## 2013-09-24 NOTE — Progress Notes (Signed)
Patient ID: Connie Porter, female   DOB: Sep 27, 1955, 58 y.o.   MRN: [id number] 58 year old female with a complaint of right knee pain. She history of osteoarthritis of the right knee is progressively worsening. She's been exercising fairly extensively recently. She lost probably 30 pounds since January. She's been taking Tylenol 3 and rubbing Voltaren gel in her knee. This does not seem to be alleviating her symptoms at present. With her increase in exercise her knee pain is continued. She is able to exercise on the elliptical machine however which is done she is a significant amount of discomfort. She also reports a significant amount discomfort in the mornings. Pain with going up and down stairs. Also pain with walking around her neighborhood most notable on the [place].  Pertinent past medical history: Morbid obesity, high cholesterol  Social history: Nonsmoker  Review of systems: As per history of present illness otherwise all systems reviewed and negative  Examination: BP 116/82  Ht 5' 7.5" (1.715 m)  Wt 240 lb (108.863 kg)  BMI 37.01 kg/m2 Well-developed well-nourished 58 year old female awake alert and oriented in no acute distress HEENT: Extraocular muscles intact, no scleral icterus  Respiratory: Normal effort  Psych: Normal/appropriate behavior  MSK exam: Right Knee: Normal to inspection with no erythema or effusion or obvious bony abnormalities. Palpation  with mild joint line tenderness lateral greater than medial. ROM full in flexion and extension and lower leg rotation. Ligaments with solid consistent endpoints including ACL, PCL, LCL, MCL. Negative Mcmurray's Non painful patellar compression. Patellar glide with crepitus. Patellar and quadriceps tendons unremarkable. Hamstring and quadriceps strength is normal.   X-rays: View x-rays of the right knee from earlier this year was reviewed the office today. Findings are consistent with mild to moderate osteoarthritis. These  are however, nonweightbearing films.  Procedure:  Injection of right knee Consent obtained and verified. Time-out conducted. Noted no overlying erythema, induration, or other signs of local infection. Skin prepped in a sterile fashion. Topical analgesic spray: Ethyl chloride. Completed without difficulty. Meds: 80 mg depo/6cc lido Pain immediately improved suggesting accurate placement of the medication. Advised to call if fevers/chills, erythema, induration, drainage, or persistent bleeding.

## 2013-09-24 NOTE — Assessment & Plan Note (Addendum)
Corticosteroid injection was given into the knee today the patient tolerated the procedure well. Was also given in the compressive sleeve to use when exercising and for 30 minutes after exercise. Encouraged to do icing. Encouraged to continue weight loss. Oral anti-inflammatories/pain medication as needed. Followup as needed.   I discussed the progression of osteoarthritis with patient today in the office. At this time her symptoms are not severe enough to consider total knee replacement. I believe that if she continues her exercise program and weight loss she will delay the possibility of needing this for a more extended period.  She may consider repeat corticosteroid injection if her symptoms return in 3-6 months or beyond that.

## 2013-10-18 ENCOUNTER — Ambulatory Visit: Admitting: Family Medicine

## 2013-11-04 ENCOUNTER — Ambulatory Visit: Admitting: Sports Medicine

## 2013-11-08 ENCOUNTER — Ambulatory Visit (INDEPENDENT_AMBULATORY_CARE_PROVIDER_SITE_OTHER): Admitting: Family Medicine

## 2013-11-08 ENCOUNTER — Encounter: Payer: Self-pay | Admitting: Family Medicine

## 2013-11-08 VITALS — BP 115/79 | HR 66 | Ht 67.0 in | Wt 240.0 lb

## 2013-11-08 DIAGNOSIS — M25569 Pain in unspecified knee: Secondary | ICD-10-CM

## 2013-11-08 DIAGNOSIS — M25561 Pain in right knee: Secondary | ICD-10-CM | POA: Insufficient documentation

## 2013-11-08 MED ORDER — METHYLPREDNISOLONE ACETATE 40 MG/ML IJ SUSP
40.0000 mg | Freq: Once | INTRAMUSCULAR | Status: AC
  Administered 2013-11-08: 40 mg via INTRA_ARTICULAR

## 2013-11-08 NOTE — Assessment & Plan Note (Signed)
Given her lack of significant improvement with corticosteroid injection, I think her OA may be most significant in the third compartment, that is the patellofemoral joint. We discussed at length. She wanted to try one more corticosteroid injection which we agreed to do today. She has no improvement for this, I think she probably needs to be seen by orthopedics. I really think most of her issues are under the patellofemoral joint and the only way look edematous with MRI so we will set that up as well. She is congratulated on her attempts at weight loss.

## 2013-11-08 NOTE — Progress Notes (Signed)
Patient ID: Connie Porter, female   DOB: [birth year], 58 y.o.   MRN: [id number]  Connie Porter - 58 y.o. female MRN [id number]  Date of birth: Apr 10, 1956    SUBJECTIVE:     Right knee pain. Has been gradually getting worse over the last couple of years. One month ago she was evaluated and given a corticosteroid injection. Injection may very little difference in her pain. Pain is particularly bad after exercise and if she's been sitting for a while. ROS:     No fever, sweats, chills, unusual weight change.  PERTINENT  PMH / PSH FH / / SH:  Past Medical, Surgical, Social, and Family History Reviewed & Updated in the EMR.  Pertinent findings include:  No history of prior knee surgery or injury. History of Crohn's disease No personal history of diabetes mellitus.  OBJECTIVE: BP 115/79  Pulse 66  Ht 5' 7"  (1.702 m)  Wt 240 lb (108.863 kg)  BMI 37.58 kg/m2  Physical Exam:  Vital signs are reviewed. GENERAL: Well-developed overweight female no acute distress KNEES: Right knee without effusion or erythema. Significant amount of crepitus under the patella on knee extension. Ligaments intact to varus and valgus stress. Mild medial joint line tenderness. Calf is soft. Distally neurovascularly intact. IMAGING: Reviewed her non-standing x-rays of her right knee. Medial joint line space narrowing, some osteophytes. Not a great film but looks like there some significant osteophytes underneath the patella.   INJECTION: Patient was given informed consent, signed copy in the chart. Appropriate time out was taken. Area prepped and draped in usual sterile fashion.  one cc of methylprednisolone 40 mg/ml plus  4 cc of 1% lidocaine without epinephrine was injected into the right knee using a(n) anterior medial approach. The patient tolerated the procedure well. There were no complications. Post procedure instructions were given. Dr. Lacinda Axon assisted.  ASSESSMENT & PLAN:  See problem based charting & AVS for pt  instructions.

## 2013-11-09 ENCOUNTER — Ambulatory Visit
Admission: RE | Admit: 2013-11-09 | Discharge: 2013-11-09 | Disposition: A | Discharge status: Home / Self Care | Source: Ambulatory Visit | Attending: Family Medicine | Admitting: Family Medicine

## 2013-11-09 DIAGNOSIS — M25561 Pain in right knee: Secondary | ICD-10-CM

## 2013-11-22 ENCOUNTER — Encounter: Payer: Self-pay | Admitting: Family Medicine

## 2013-11-22 ENCOUNTER — Telehealth: Payer: Self-pay | Admitting: Family Medicine

## 2013-11-22 NOTE — Telephone Encounter (Signed)
[place] Please tell her this about her MRI. I have sent her aletter w same message. THANKS!  "This looks like most of your arthritis is in the area under your knee cap. The arthritis is SEVERE. If the shot does not help you then I think you need to see an orthopedic surgeon. Let me knoiw" Connie Porter

## 2013-12-15 ENCOUNTER — Telehealth: Payer: Self-pay

## 2013-12-15 NOTE — Telephone Encounter (Signed)
Pt aware and has appt and labs scheduled

## 2013-12-15 NOTE — Telephone Encounter (Signed)
Message copied by Barron Alvine on Wed Dec 15, 2013  8:14 AM ------      Message from: Barron Alvine      Created: Tue Sep 14, 2013 10:00 AM       Repeat labs and f/u needed see 09/14/13 note  ------

## 2013-12-27 ENCOUNTER — Other Ambulatory Visit (INDEPENDENT_AMBULATORY_CARE_PROVIDER_SITE_OTHER)

## 2013-12-27 DIAGNOSIS — K5090 Crohn's disease, unspecified, without complications: Secondary | ICD-10-CM

## 2013-12-27 LAB — COMPREHENSIVE METABOLIC PANEL
ALBUMIN: 3.5 g/dL (ref 3.5–5.2)
ALT: 12 U/L (ref 0–35)
AST: 21 U/L (ref 0–37)
Alkaline Phosphatase: 41 U/L (ref 39–117)
BUN: 21 mg/dL (ref 6–23)
CALCIUM: 9.4 mg/dL (ref 8.4–10.5)
CHLORIDE: 107 meq/L (ref 96–112)
CO2: 29 mEq/L (ref 19–32)
Creatinine, Ser: 0.9 mg/dL (ref 0.4–1.2)
GFR: 70.78 mL/min (ref >60.00)
Glucose, Bld: 77 mg/dL (ref 70–99)
POTASSIUM: 4.2 meq/L (ref 3.5–5.1)
Sodium: 141 mEq/L (ref 135–145)
Total Bilirubin: 0.6 mg/dL (ref 0.2–1.2)
Total Protein: 7.2 g/dL (ref 6.0–8.3)

## 2013-12-27 LAB — CBC WITH DIFFERENTIAL/PLATELET
BASOS ABS: 0 10*3/uL (ref 0.0–0.1)
BASOS PCT: 0.8 % (ref 0.0–3.0)
Eosinophils Absolute: 0.1 10*3/uL (ref 0.0–0.7)
Eosinophils Relative: 2.4 % (ref 0.0–5.0)
HCT: 37.4 % (ref 36.0–46.0)
Hemoglobin: 12.7 g/dL (ref 12.0–15.0)
LYMPHS PCT: 21.8 % (ref 12.0–46.0)
Lymphs Abs: 0.6 10*3/uL — ABNORMAL LOW (ref 0.7–4.0)
MCHC: 34 g/dL (ref 30.0–36.0)
MCV: 99.1 fl (ref 78.0–100.0)
MONOS PCT: 8.9 % (ref 3.0–12.0)
Monocytes Absolute: 0.3 10*3/uL (ref 0.1–1.0)
NEUTROS PCT: 66.1 % (ref 43.0–77.0)
Neutro Abs: 2 10*3/uL (ref 1.4–7.7)
Platelets: 283 10*3/uL (ref 150.0–400.0)
RBC: 3.77 Mil/uL — AB (ref 3.87–5.11)
RDW: 14.7 % (ref 11.5–15.5)
WBC: 3 10*3/uL — ABNORMAL LOW (ref 4.0–10.5)

## 2013-12-29 ENCOUNTER — Encounter: Payer: Self-pay | Admitting: Gastroenterology

## 2013-12-29 ENCOUNTER — Ambulatory Visit (INDEPENDENT_AMBULATORY_CARE_PROVIDER_SITE_OTHER): Admitting: Gastroenterology

## 2013-12-29 VITALS — BP 120/70 | HR 72 | Ht 67.0 in | Wt 240.1 lb

## 2013-12-29 DIAGNOSIS — K5010 Crohn's disease of large intestine without complications: Secondary | ICD-10-CM

## 2013-12-29 DIAGNOSIS — K50119 Crohn's disease of large intestine with unspecified complications: Secondary | ICD-10-CM

## 2013-12-29 NOTE — Addendum Note (Signed)
Addended by: Barron Alvine on: 12/29/2013 10:08 AM   Modules accepted: Orders

## 2013-12-29 NOTE — Progress Notes (Signed)
Review of gastrointestinal problems:  1.Crohn's ileocolitis: Workup at [facility].Marland KitchenMarland KitchenPromethius IBD Serology 7 testing 02/2008 "pattern consistent with IBD.Marland KitchenMarland KitchenCrohn's." Colonoscopy 01/2008 showed terminal ileum and right colon ulcerations, pathology c/w acute and chronic ileitis, colitis, also HP polyps. Colonoscopy Path report from 2006 showed HP polyps. Office note 05/2009 documented previous C. diff colitis (treated empirically and also after toxin +). 2012 doing well on oral mesalamine 3.6 gms/day. ? Worsening of disease 03/2013, c. Diff PCR neg; increased mesalamine to 4.8gm daily. Colonoscopy 04/2012 found essentially normal colon but moderate to severe ileitis (path confirmed acute and chronic inflammation), started on 214m azathiaprine daily Had erythema nodosum 2014, early, improved with steroids. 11/2012 doing very well on 200 azathiaprine daily. 04/2013: urgency, straining: felt to be more functional then IBD related; fiber supplements stopped.  2. Dysphasia: EGD August, 2011 found Schatzki's ring that was dilated to 20 mm, small hiatal hernia, tortuous esophagus. Swallowing much improved. March 2012 PPI only every other day. Repeat EGD 03/2013 for dysphagia showed 4cn HH, tortuous distal esophagus, Schatzki's ring that was dilated to 225m    HPI: This is a   very pleasant [age]ear old woman whom I last saw her 4 months ago.  Labs this week showed wbc 3.1k, otherwise normal cbc, cmet.    Doing well.    Has tried powdered fiber, seems to help (alternates between hard stool to looser stools).  No real pains in her abd.    Had acute diarrheal illness lasting 1 day, no bleeding.  Past Medical History  Diagnosis Date  . Hypertension   . Crohn's ileocolitis   . C. difficile diarrhea   . Schatzki's ring   . Hiatal hernia   . Allergy   . GERD (gastroesophageal reflux disease)     Past Surgical History  Procedure Laterality Date  . Tonsillectomy and adenoidectomy    . C section x3      x 3   . Cholecystectomy    . Appendectomy    . Wisdom tooth extraction    . Colonoscopy    . Polypectomy      Current Outpatient Prescriptions  Medication Sig Dispense Refill  . azathioprine (IMURAN) 100 MG tablet Take 2 tablets (200 mg total) by mouth daily.  60 tablet  11  . CALCIUM CITRATE PO Take 1 tablet by mouth 2 (two) times daily.      . Cholecalciferol (VITAMIN D-3 PO) Take 1 capsule by mouth at bedtime.      . Cyanocobalamin (VITAMIN B-12 PO) Take 1 tablet by mouth daily.      . fexofenadine (ALLEGRA) 180 MG tablet Take 180 mg by mouth daily.      . Marland Kitchenevocetirizine (XYZAL) 5 MG tablet Take 5 mg by mouth every evening.      . montelukast (SINGULAIR) 10 MG tablet Take 10 mg by mouth at bedtime.      . Multiple Vitamin (MULITIVITAMIN WITH MINERALS) TABS Take 1 tablet by mouth 2 (two) times daily.      . Omega-3 Fatty Acids (FISH OIL PO) Take 2 capsules by mouth 2 (two) times daily.       . pravastatin (PRAVACHOL) 20 MG tablet Take 1 tablet (20 mg total) by mouth daily.  90 tablet  3  . VOLTAREN 1 % GEL APPLY 2G TO AFFECTED AREA ON SKIN 4 TIMES DAILY  100 g  1  . acetaminophen-codeine (TYLENOL #3) 300-30 MG per tablet Take 1 tablet by mouth every 4 (four) hours as needed.  [place]  tablet  0  . traMADol (ULTRAM) 50 MG tablet Take 1 tablet (50 mg total) by mouth every 8 (eight) hours as needed.  60 tablet  0   No current facility-administered medications for this visit.    Allergies as of 12/29/2013  . (No Known Allergies)    Family History  Problem Relation Age of Onset  . Heart disease Father   . Colon cancer Neg Hx   . Esophageal cancer Neg Hx   . Rectal cancer Neg Hx   . Stomach cancer Neg Hx     History   Social History  . Marital Status: Married    Spouse Name: N/A    Number of Children: 3  . Years of Education: N/A   Occupational History  . Retired Pharmacist, hospital    Social History Main Topics  . Smoking status: Never Smoker   . Smokeless tobacco: Never Used  . Alcohol  Use: 0.0 oz/week    4-5 Glasses of wine, 4-5 Drinks containing 0.5 oz of alcohol per week     Comment: pt drinks 4-5 drinks only on the weekends  . Drug Use: No  . Sexual Activity: Not on file   Other Topics Concern  . Not on file   Social History Narrative  . No narrative on file      Physical Exam: BP 120/70  Pulse 72  Ht 5' 7"  (1.702 m)  Wt 240 lb 2 oz (108.92 kg)  BMI 37.60 kg/m2 Constitutional: generally well-appearing Psychiatric: alert and oriented x3 Abdomen: soft, nontender, nondistended, no obvious ascites, no peritoneal signs, normal bowel sounds     Assessment and plan: 58 y.o. female with Crohn's ileocolitis  Doing well on immunomodulators. She will continue current dose, 200 mg once daily, for now. She'll have repeat CBC, complete metabolic profile in 3 months time. She will return to see me in 6 months. She does call sooner if she has any problems or concerns.

## 2013-12-29 NOTE — Patient Instructions (Signed)
Repeat CBC, cmet in 3 months. Please return to see Dr. Ardis Hughs in 6 months, sooner if needed.

## 2014-01-05 ENCOUNTER — Other Ambulatory Visit: Payer: Self-pay | Admitting: Internal Medicine

## 2014-01-11 ENCOUNTER — Telehealth: Payer: Self-pay | Admitting: Internal Medicine

## 2014-01-11 NOTE — Telephone Encounter (Signed)
Pt calling [facility] on [address]

## 2014-01-13 ENCOUNTER — Encounter: Payer: Self-pay | Admitting: Internal Medicine

## 2014-01-13 ENCOUNTER — Other Ambulatory Visit: Payer: Self-pay | Admitting: Emergency Medicine

## 2014-01-13 ENCOUNTER — Ambulatory Visit: Attending: Internal Medicine | Admitting: Internal Medicine

## 2014-01-13 VITALS — BP 113/75 | HR 76 | Temp 98.6°F | Resp 16 | Ht 67.5 in | Wt 241.0 lb

## 2014-01-13 DIAGNOSIS — Z79899 Other long term (current) drug therapy: Secondary | ICD-10-CM | POA: Insufficient documentation

## 2014-01-13 DIAGNOSIS — K5000 Crohn's disease of small intestine without complications: Secondary | ICD-10-CM | POA: Insufficient documentation

## 2014-01-13 DIAGNOSIS — Z23 Encounter for immunization: Secondary | ICD-10-CM

## 2014-01-13 DIAGNOSIS — Z0000 Encounter for general adult medical examination without abnormal findings: Secondary | ICD-10-CM | POA: Insufficient documentation

## 2014-01-13 DIAGNOSIS — E785 Hyperlipidemia, unspecified: Secondary | ICD-10-CM

## 2014-01-13 DIAGNOSIS — J302 Other seasonal allergic rhinitis: Secondary | ICD-10-CM | POA: Diagnosis not present

## 2014-01-13 HISTORY — DX: Hyperlipidemia, unspecified: E78.5

## 2014-01-13 MED ORDER — PRAVASTATIN SODIUM 20 MG PO TABS: 20.0000 mg | ORAL_TABLET | Freq: Every day | ORAL | Status: DC

## 2014-01-13 NOTE — Progress Notes (Signed)
Patient ID: Connie Porter, female   DOB: 07/25/1955, 58 y.o.   MRN: [id number]   Connie Porter, is a 58 y.o. female  [id number]:[id number]  [id number]  DOB - 1956-01-13  Chief Complaint  Patient presents with  . Follow-up        Subjective:   Connie Porter is a 58 y.o. female here today for a follow up visit in hyperlipidemia.  She has been compliant with her medications.  She is following a high protein/low carb diet and reports a 30 lb weight loss.  She exercises daily either with walking or on elliptical.  She denies CP, muscle aches, shortness of breath, headache, abdominal pain, nausea, new weakness tingling or numbness.  Problem  Encounter for Immunization  Hyperlipidemia    ALLERGIES: No Known Allergies  PAST MEDICAL HISTORY: Past Medical History  Diagnosis Date  . Hypertension   . Crohn's ileocolitis   . C. difficile diarrhea   . Schatzki's ring   . Hiatal hernia   . Allergy   . GERD (gastroesophageal reflux disease)     MEDICATIONS AT HOME: Prior to Admission medications   Medication Sig Start Date End Date Taking? Authorizing Provider  acetaminophen-codeine (TYLENOL #3) 300-30 MG per tablet Take 1 tablet by mouth every 4 (four) hours as needed. 06/09/13  Yes Tresa Garter, MD  azathioprine (IMURAN) 100 MG tablet Take 2 tablets (200 mg total) by mouth daily. 09/14/13  Yes Milus Banister, MD  CALCIUM CITRATE PO Take 1 tablet by mouth 2 (two) times daily.   Yes Historical Provider, MD  Cholecalciferol (VITAMIN D-3 PO) Take 1 capsule by mouth at bedtime.   Yes Historical Provider, MD  Cyanocobalamin (VITAMIN B-12 PO) Take 1 tablet by mouth daily.   Yes Historical Provider, MD  fexofenadine (ALLEGRA) 180 MG tablet Take 180 mg by mouth daily.   Yes Historical Provider, MD  levocetirizine (XYZAL) 5 MG tablet Take 5 mg by mouth every evening.   Yes Historical Provider, MD  montelukast (SINGULAIR) 10 MG tablet Take 10 mg by mouth at bedtime.   Yes Historical Provider,  MD  Multiple Vitamin (MULITIVITAMIN WITH MINERALS) TABS Take 1 tablet by mouth 2 (two) times daily.   Yes Historical Provider, MD  Omega-3 Fatty Acids (FISH OIL PO) Take 2 capsules by mouth 2 (two) times daily.    Yes Historical Provider, MD  pravastatin (PRAVACHOL) 20 MG tablet Take 1 tablet (20 mg total) by mouth daily. 01/13/14  Yes Darril Patriarca Essie Christine, MD  traMADol (ULTRAM) 50 MG tablet Take 1 tablet (50 mg total) by mouth every 8 (eight) hours as needed. 09/14/13  Yes Pope Brunty E Johnson [place], MD  VOLTAREN 1 % GEL APPLY 2G TO AFFECTED AREA ON SKIN 4 TIMES DAILY   Yes Tresa Garter, MD   Review of Systems  Constitutional: Positive for weight loss.       Intentional wt loss  HENT: Negative.   Eyes: Negative.   Respiratory: Negative.   Cardiovascular: Negative.   Gastrointestinal: Negative.   Genitourinary: Negative.   Musculoskeletal: Negative.   Skin: Negative.   Neurological: Negative.   Endo/Heme/Allergies: Negative.   Psychiatric/Behavioral: Negative.      Objective:   Filed Vitals:   01/13/14 1659  BP: 113/75  Pulse: 76  Temp: 98.6 F (37 C)  TempSrc: Oral  Resp: 16  Height: 5' 7.5" (1.715 m)  Weight: 241 lb (109.317 kg)  SpO2: 97%    Exam General appearance : Awake, alert, not in any distress.  Speech Clear. Not toxic looking HEENT: Atraumatic and Normocephalic, pupils equally reactive to light and accomodation Neck: supple, no JVD. No cervical lymphadenopathy.  Chest:Good air entry bilaterally, no added sounds  CVS: S1 S2 regular, no murmurs.  Abdomen: Bowel sounds present, Non tender and not distended with no gaurding, rigidity or rebound. Extremities: B/L Lower Ext shows no edema, both legs are warm to touch Neurology: Awake alert, and oriented X 3, CN II-XII intact, Non focal Skin:No Rash Wounds:N/A  Data Review No results found for this basename: HGBA1C     Assessment & Plan   1. Encounter for immunization  Influenza vaccination  administered  2. Hyperlipidemia  Continue pravachol.  Lipid panel to monitor efficacy of treatment.   To address this please limit saturated fat to no more than 7% of your calories, limit cholesterol to 200 mg/day, increase fiber and exercise as tolerated. If needed we may add another cholesterol lowering medication to your regimen.   The patient was given clear instructions to go to ER or return to medical center if symptoms don't improve, worsen or new problems develop. The patient verbalized understanding. The patient was told to call to get lab results if they haven't heard anything in the next week.   This note has been created with Surveyor, quantity. Any transcriptional errors are unintentional.    Angelica Chessman, MD, [place], [place], [place] [facility] [place], [place]   01/13/2014, 5:37 PM

## 2014-01-13 NOTE — Patient Instructions (Signed)
DASH Eating Plan °DASH stands for "Dietary Approaches to Stop Hypertension." The DASH eating plan is a healthy eating plan that has been shown to reduce high blood pressure (hypertension). Additional health benefits may include reducing the risk of type 2 diabetes mellitus, heart disease, and stroke. The DASH eating plan may also help with weight loss. °WHAT DO I NEED TO KNOW ABOUT THE DASH EATING PLAN? °For the DASH eating plan, you will follow these general guidelines: °· Choose foods with a percent daily value for sodium of less than 5% (as listed on the food label). °· Use salt-free seasonings or herbs instead of table salt or sea salt. °· Check with your health care provider or pharmacist before using salt substitutes. °· Eat lower-sodium products, often labeled as "lower sodium" or "no salt added." °· Eat fresh foods. °· Eat more vegetables, fruits, and low-fat dairy products. °· Choose whole grains. Look for the word "whole" as the first word in the ingredient list. °· Choose fish and skinless chicken or turkey more often than red meat. Limit fish, poultry, and meat to 6 oz (170 g) each day. °· Limit sweets, desserts, sugars, and sugary drinks. °· Choose heart-healthy fats. °· Limit cheese to 1 oz (28 g) per day. °· Eat more home-cooked food and less restaurant, buffet, and fast food. °· Limit fried foods. °· Cook foods using methods other than frying. °· Limit canned vegetables. If you do use them, rinse them well to decrease the sodium. °· When eating at a restaurant, ask that your food be prepared with less salt, or no salt if possible. °WHAT FOODS CAN I EAT? °Seek help from a dietitian for individual calorie needs. °Grains °Whole grain or whole wheat bread. Brown rice. Whole grain or whole wheat pasta. Quinoa, bulgur, and whole grain cereals. Low-sodium cereals. Corn or whole wheat flour tortillas. Whole grain cornbread. Whole grain crackers. Low-sodium crackers. °Vegetables °Fresh or frozen vegetables  (raw, steamed, roasted, or grilled). Low-sodium or reduced-sodium tomato and vegetable juices. Low-sodium or reduced-sodium tomato sauce and paste. Low-sodium or reduced-sodium canned vegetables.  °Fruits °All fresh, canned (in natural juice), or frozen fruits. °Meat and Other Protein Products °Ground beef (85% or leaner), grass-fed beef, or beef trimmed of fat. Skinless chicken or turkey. Ground chicken or turkey. Pork trimmed of fat. All fish and seafood. Eggs. Dried beans, peas, or lentils. Unsalted nuts and seeds. Unsalted canned beans. °Dairy °Low-fat dairy products, such as skim or 1% milk, 2% or reduced-fat cheeses, low-fat ricotta or cottage cheese, or plain low-fat yogurt. Low-sodium or reduced-sodium cheeses. °Fats and Oils °Tub margarines without trans fats. Light or reduced-fat mayonnaise and salad dressings (reduced sodium). Avocado. Safflower, olive, or canola oils. Natural peanut or almond butter. °Other °Unsalted popcorn and pretzels. °The items listed above may not be a complete list of recommended foods or beverages. Contact your dietitian for more options. °WHAT FOODS ARE NOT RECOMMENDED? °Grains °White bread. White pasta. White rice. Refined cornbread. Bagels and croissants. Crackers that contain trans fat. °Vegetables °Creamed or fried vegetables. Vegetables in a cheese sauce. Regular canned vegetables. Regular canned tomato sauce and paste. Regular tomato and vegetable juices. °Fruits °Dried fruits. Canned fruit in light or heavy syrup. Fruit juice. °Meat and Other Protein Products °Fatty cuts of meat. Ribs, chicken wings, bacon, sausage, bologna, salami, chitterlings, fatback, hot dogs, bratwurst, and packaged luncheon meats. Salted nuts and seeds. Canned beans with salt. °Dairy °Whole or 2% milk, cream, half-and-half, and cream cheese. Whole-fat or sweetened yogurt. Full-fat   cheeses or blue cheese. Nondairy creamers and whipped toppings. Processed cheese, cheese spreads, or cheese  curds. °Condiments °Onion and garlic salt, seasoned salt, table salt, and sea salt. Canned and packaged gravies. Worcestershire sauce. Tartar sauce. Barbecue sauce. Teriyaki sauce. Soy sauce, including reduced sodium. Steak sauce. Fish sauce. Oyster sauce. Cocktail sauce. Horseradish. Ketchup and mustard. Meat flavorings and tenderizers. Bouillon cubes. Hot sauce. Tabasco sauce. Marinades. Taco seasonings. Relishes. °Fats and Oils °Butter, stick margarine, lard, shortening, ghee, and bacon fat. Coconut, palm kernel, or palm oils. Regular salad dressings. °Other °Pickles and olives. Salted popcorn and pretzels. °The items listed above may not be a complete list of foods and beverages to avoid. Contact your dietitian for more information. °WHERE CAN I FIND MORE INFORMATION? °National Heart, Lung, and Blood Institute: www.nhlbi.nih.gov/health/health-topics/topics/dash/ °Document Released: 03/21/2011 Document Revised: 08/16/2013 Document Reviewed: 02/03/2013 °ExitCare® Patient Information ©2015 [employer]. This information is not intended to replace advice given to you by your health care provider. Make sure you discuss any questions you have with your health care provider. ° °

## 2014-01-13 NOTE — Progress Notes (Signed)
Pt is here for a check up. Pt has HTN, allergy's and high cholesterol.

## 2014-01-14 LAB — LIPID PANEL
Cholesterol: 218 mg/dL — ABNORMAL HIGH (ref 0–200)
HDL: 84 mg/dL (ref >39)
LDL CALC: 107 mg/dL — AB (ref 0–99)
Total CHOL/HDL Ratio: 2.6 Ratio
Triglycerides: 137 mg/dL (ref <150)
VLDL: 27 mg/dL (ref 0–40)

## 2014-01-28 ENCOUNTER — Telehealth: Payer: Self-pay | Admitting: Emergency Medicine

## 2014-01-28 NOTE — Telephone Encounter (Signed)
Message copied by Ricci Barker on Fri Jan 28, 2014  4:08 PM ------      Message from: Angelica Chessman E      Created: Wed Jan 26, 2014  5:53 PM       Please inform patient that the cholesterol level has improved significantly. Continue low-fat low-cholesterol diet, regular physical exercise. ------

## 2014-01-28 NOTE — Telephone Encounter (Signed)
Pt given lab results with improvement

## 2014-03-30 ENCOUNTER — Telehealth: Payer: Self-pay

## 2014-03-30 NOTE — Telephone Encounter (Signed)
-----   Message from Barron Alvine, [place] sent at 12/29/2013 10:08 AM EDT ----- Pt needs to have labs

## 2014-03-30 NOTE — Telephone Encounter (Signed)
Pt has been notified and will have labs next week

## 2014-04-05 ENCOUNTER — Other Ambulatory Visit (INDEPENDENT_AMBULATORY_CARE_PROVIDER_SITE_OTHER)

## 2014-04-05 DIAGNOSIS — K50119 Crohn's disease of large intestine with unspecified complications: Secondary | ICD-10-CM

## 2014-04-05 LAB — CBC WITH DIFFERENTIAL/PLATELET
Basophils Absolute: 0 10*3/uL (ref 0.0–0.1)
Basophils Relative: 0.4 % (ref 0.0–3.0)
EOS PCT: 2.5 % (ref 0.0–5.0)
Eosinophils Absolute: 0.1 10*3/uL (ref 0.0–0.7)
HEMATOCRIT: 40.6 % (ref 36.0–46.0)
HEMOGLOBIN: 13.4 g/dL (ref 12.0–15.0)
LYMPHS ABS: 0.7 10*3/uL (ref 0.7–4.0)
Lymphocytes Relative: 19.6 % (ref 12.0–46.0)
MCHC: 33.1 g/dL (ref 30.0–36.0)
MCV: 98.7 fl (ref 78.0–100.0)
MONOS PCT: 7 % (ref 3.0–12.0)
Monocytes Absolute: 0.3 10*3/uL (ref 0.1–1.0)
NEUTROS ABS: 2.6 10*3/uL (ref 1.4–7.7)
Neutrophils Relative %: 70.5 % (ref 43.0–77.0)
Platelets: 264 10*3/uL (ref 150.0–400.0)
RBC: 4.11 Mil/uL (ref 3.87–5.11)
RDW: 14.4 % (ref 11.5–15.5)
WBC: 3.7 10*3/uL — AB (ref 4.0–10.5)

## 2014-04-05 LAB — COMPREHENSIVE METABOLIC PANEL
ALT: 13 U/L (ref 0–35)
AST: 22 U/L (ref 0–37)
Albumin: 3.8 g/dL (ref 3.5–5.2)
Alkaline Phosphatase: 44 U/L (ref 39–117)
BILIRUBIN TOTAL: 0.7 mg/dL (ref 0.2–1.2)
BUN: 16 mg/dL (ref 6–23)
CO2: 24 meq/L (ref 19–32)
Calcium: 9.4 mg/dL (ref 8.4–10.5)
Chloride: 106 mEq/L (ref 96–112)
Creatinine, Ser: 0.9 mg/dL (ref 0.4–1.2)
GFR: 68 mL/min (ref >60.00)
GLUCOSE: 86 mg/dL (ref 70–99)
Potassium: 4.1 mEq/L (ref 3.5–5.1)
SODIUM: 139 meq/L (ref 135–145)
TOTAL PROTEIN: 7.1 g/dL (ref 6.0–8.3)

## 2014-04-11 ENCOUNTER — Other Ambulatory Visit: Payer: Self-pay

## 2014-04-11 DIAGNOSIS — K50119 Crohn's disease of large intestine with unspecified complications: Secondary | ICD-10-CM

## 2014-05-02 ENCOUNTER — Encounter: Payer: Self-pay | Admitting: Family Medicine

## 2014-05-02 ENCOUNTER — Ambulatory Visit (INDEPENDENT_AMBULATORY_CARE_PROVIDER_SITE_OTHER): Admitting: Family Medicine

## 2014-05-02 VITALS — BP 118/83 | Ht 67.5 in | Wt 235.0 lb

## 2014-05-02 DIAGNOSIS — M25561 Pain in right knee: Secondary | ICD-10-CM

## 2014-05-02 DIAGNOSIS — M25572 Pain in left ankle and joints of left foot: Secondary | ICD-10-CM

## 2014-05-02 DIAGNOSIS — M222X1 Patellofemoral disorders, right knee: Secondary | ICD-10-CM

## 2014-05-02 MED ORDER — METHYLPREDNISOLONE ACETATE 40 MG/ML IJ SUSP
40.0000 mg | Freq: Once | INTRAMUSCULAR | Status: AC
  Administered 2014-05-02: 40 mg via INTRA_ARTICULAR

## 2014-05-02 NOTE — Progress Notes (Signed)
Patient ID: Connie Porter, female   DOB: 1955-07-08, 59 y.o.   MRN: [id number]  Connie Porter - 59 y.o. female MRN [id number]  Date of birth: 09-Feb-1956    SUBJECTIVE:     Right knee pain. Had corticosteroid injection in July 2015. It worked really well until about the last 4 weeks. Now she's having trouble doing her activities including caring for 2 grandchildren. She's quite active walking and using the elliptical machine as well. She has a knee sleeve and has questions about whether not she should wear it more regularly. #2. In the last week and a half she's developed some left heel pain; feels a little bit similar to what she had on the right heel many years ago. The right heel had been diagnosed as plantar fasciitis. Left heel pain that she is experiencing now is not as severe. Worse after she's been sitting for a while. Plantar portion of the heel, some radiation up the sides of the calcaneus. She's been stretching it with a tennis ball that seems to help some. ROS:     No unusual weight change. No giving way or locking of her knee. No fever, sweats, chills. She's noted no erythema or swelling of the knee.  PERTINENT  PMH / PSH FH / / SH:  Past Medical, Surgical, Social, and Family History Reviewed & Updated in the EMR.  Pertinent findings include:  Crohn's disease, patellofemoral joint also arthritis, obesity. No personal history diabetes mellitus.  OBJECTIVE: BP 118/83 mmHg  Ht 5' 7.5" (1.715 m)  Wt 235 lb (106.595 kg)  BMI 36.24 kg/m2  Physical Exam:  Vital signs are reviewed. GEN.: Well-developed overweight female no acute distress KNEE: Right. No erythema, no warmth, no effusion. She has full range of motion extension and flexion. Ligaments intact to varus and valgus stress. Positive pain with patellofemoral grind test. Patella is not very mobile. FOOT: Left. The heels very slightly tender to palpation in the area over the origin of the plantar fascia. There is no defect, no erythema,  no warmth noted over this area.  IMAGING: Complete review of her knee that was taken in nonweightbearing physician did not show any significant tibiofemoral joint arthritis. The MRI of her right knee however shows significant patellofemoral joint arthritis with some component of degeneration of the femoral condyle as well medially. I reviewed these images with her today.  INJECTION: Patient was given informed consent, signed copy in the chart. Appropriate time out was taken. Area prepped and draped in usual sterile fashion. 1 cc of methylprednisolone 40 mg/ml plus  4 cc of 1% lidocaine without epinephrine was injected into the right knee using a(n) anterior medial approach. The patient tolerated the procedure well. There were no complications. Post procedure instructions were given.   ASSESSMENT & PLAN: Regarding her foot pain. I think she has been bearing more weight on the left side secondary to her right knee pain. We'll have her continue to stretch the plantar fascia but I would recommend using soda bottle filled with ice so she gets to stretch and icing at the same time. She's not improving, then I would have her come back and we can evaluate her for orthotic. I do think this is early plantar fasciitis. See problem based charting & AVS for pt instructions.

## 2014-05-02 NOTE — Assessment & Plan Note (Signed)
I reviewed her films with her again today. Her arthritis is isolated to the patellofemoral joint. We reviewed her MRI as well. When I gave her an injection in July she had significant relief up in school about December. Notably she had a corticosteroid injection about a month before that that was not helpful. I'm not sure how much longer injections are going to help her, especially given the location of her arthritis and we discussed at some length today. Certainly I'll be willing to try another injection today we had done that. If she's not getting any relief the next 2 weeks or if it only lasts a brief time then I think we should send her to orthopedics for evaluation for total knee replacement. We discussed at length.

## 2014-06-13 ENCOUNTER — Other Ambulatory Visit (INDEPENDENT_AMBULATORY_CARE_PROVIDER_SITE_OTHER)

## 2014-06-13 ENCOUNTER — Encounter: Payer: Self-pay | Admitting: Family Medicine

## 2014-06-13 ENCOUNTER — Ambulatory Visit (INDEPENDENT_AMBULATORY_CARE_PROVIDER_SITE_OTHER): Admitting: Family Medicine

## 2014-06-13 VITALS — BP 112/69 | HR 76 | Ht 67.0 in | Wt 235.0 lb

## 2014-06-13 DIAGNOSIS — M222X1 Patellofemoral disorders, right knee: Secondary | ICD-10-CM

## 2014-06-13 DIAGNOSIS — Q394 Esophageal web: Secondary | ICD-10-CM

## 2014-06-13 DIAGNOSIS — K5090 Crohn's disease, unspecified, without complications: Secondary | ICD-10-CM

## 2014-06-13 DIAGNOSIS — K222 Esophageal obstruction: Secondary | ICD-10-CM

## 2014-06-13 DIAGNOSIS — K50119 Crohn's disease of large intestine with unspecified complications: Secondary | ICD-10-CM

## 2014-06-13 DIAGNOSIS — M25561 Pain in right knee: Secondary | ICD-10-CM

## 2014-06-13 LAB — CBC WITH DIFFERENTIAL/PLATELET
BASOS PCT: 0.4 % (ref 0.0–3.0)
Basophils Absolute: 0 10*3/uL (ref 0.0–0.1)
EOS PCT: 1.4 % (ref 0.0–5.0)
Eosinophils Absolute: 0.1 10*3/uL (ref 0.0–0.7)
HCT: 36.6 % (ref 36.0–46.0)
HEMOGLOBIN: 12.4 g/dL (ref 12.0–15.0)
LYMPHS ABS: 0.7 10*3/uL (ref 0.7–4.0)
Lymphocytes Relative: 16.3 % (ref 12.0–46.0)
MCHC: 33.9 g/dL (ref 30.0–36.0)
MCV: 97.8 fl (ref 78.0–100.0)
MONO ABS: 0.3 10*3/uL (ref 0.1–1.0)
Monocytes Relative: 7.8 % (ref 3.0–12.0)
NEUTROS ABS: 3 10*3/uL (ref 1.4–7.7)
Neutrophils Relative %: 74.1 % (ref 43.0–77.0)
Platelets: 333 10*3/uL (ref 150.0–400.0)
RBC: 3.74 Mil/uL — ABNORMAL LOW (ref 3.87–5.11)
RDW: 14.6 % (ref 11.5–15.5)
WBC: 4.1 10*3/uL (ref 4.0–10.5)

## 2014-06-13 LAB — COMPREHENSIVE METABOLIC PANEL
ALT: 10 U/L (ref 0–35)
AST: 17 U/L (ref 0–37)
Albumin: 4.2 g/dL (ref 3.5–5.2)
Alkaline Phosphatase: 40 U/L (ref 39–117)
BILIRUBIN TOTAL: 0.5 mg/dL (ref 0.2–1.2)
BUN: 22 mg/dL (ref 6–23)
CO2: 27 meq/L (ref 19–32)
CREATININE: 0.9 mg/dL (ref 0.40–1.20)
Calcium: 10 mg/dL (ref 8.4–10.5)
Chloride: 103 mEq/L (ref 96–112)
GFR: 67.96 mL/min (ref >60.00)
GLUCOSE: 90 mg/dL (ref 70–99)
Potassium: 4.1 mEq/L (ref 3.5–5.1)
Sodium: 137 mEq/L (ref 135–145)
Total Protein: 7.3 g/dL (ref 6.0–8.3)

## 2014-06-13 MED ORDER — METHYLPREDNISOLONE ACETATE 80 MG/ML IJ SUSP
80.0000 mg | Freq: Once | INTRAMUSCULAR | Status: AC
  Administered 2014-06-13: 80 mg via INTRA_ARTICULAR

## 2014-06-13 MED ORDER — METHYLPREDNISOLONE ACETATE 80 MG/ML IJ SUSP: 80.0000 mg | Freq: Once | INTRAMUSCULAR | Status: DC

## 2014-06-14 ENCOUNTER — Encounter: Payer: Self-pay | Admitting: Family Medicine

## 2014-06-14 DIAGNOSIS — K222 Esophageal obstruction: Secondary | ICD-10-CM | POA: Insufficient documentation

## 2014-06-14 NOTE — Assessment & Plan Note (Signed)
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   June 2015 corticosteroid injection with little relief. We tried a second one in July 2015 which gave her significant relief until 2016. At last office visit I injected her again. She reports little or no improvement. We discussed at length today. I do think she needs to go ahead and set up an appointment for orthopedics to be evaluated for potential knee replacement in the future. She would like to have a second injection today even notes only been a month and I agree. Given her history she has significant relief from the last injection. Today I injected her with 80 mg methylprednisolone.   Again, I reviewed her MRI with her. I'll go ahead and send her to orthopedics for further evaluation. Unclear if the injection today we'll help her but long-term think she needs to be hooked up with orthopedics. Follow-up with [place] when necessary.

## 2014-06-14 NOTE — Progress Notes (Signed)
Patient ID: Connie Porter, female   DOB: October 24, 1955, 59 y.o.   MRN: [id number]  Connie Porter - 59 y.o. female MRN [id number]  Date of birth: [birth year]    SUBJECTIVE:     Knee pain. Known patellofemoral joint severe osteoarthritis. 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   June 2015 corticosteroid injection with little relief. We tried a second one in July 2015 which gave her significant relief until 2016. At last office visit I injected her again. She reports little or no improvement. We discussed at length today. I do think she needs to go ahead and set up an appointment for orthopedics to be evaluated for potential knee replacement in the future. She would like to have a second injection today even notes only been a month  ROS:     No unusual weight change. Notably since 2015 she's lost 30 pounds and has maintained her current weight. No fever, sweats, chills. She had no problems with the last injection specifically no knee swelling, no increase in pain, no knee stiffness, no erythema.  PERTINENT  PMH / PSH FH / / SH:  Past Medical, Surgical, Social, and Family History Reviewed & Updated in the EMR.  Pertinent findings include:  Crohn's disease. Sees Dr. Ardis Hughs. Sounds like her course is currently pretty stable and she is on a maintenance dose of azathioprine  History of shots he's ring status post esophageal dilation.  No personal history diabetes mellitus.   OBJECTIVE: BP 112/69 mmHg  Pulse 76  Ht 5' 7"  (1.702 m)  Wt 235 lb (106.595 kg)  BMI 36.80 kg/m2  Physical Exam:  Vital signs are reviewed. GEN.: Well-developed overweight female no acute distress KNEE: Right. Tender to palpation at the medial border of the patella. Very positive patellar grind test. Mild crepitus on extension. She has full range of motion extension and flexion. Ligamentously intact to varus and  valgus stress. Normal [place]. There is some fullness to the popliteal space but no discrete mass. The calf is soft. SKIN: Area over the right knee is without any lesion, no erythema, no warmth. VASCULAR: Dorsalis pedis pulse on the right 2+. Normal capillary refill. NEURO: Intact sensation soft touch distally in the right lower extremity.   INJECTION: Patient was given informed consent, signed copy in the chart. Appropriate time out was taken. Area prepped and draped in usual sterile fashion. 1 cc of methylprednisolone 80 mg/ml plus  4 cc of 1% lidocaine without epinephrine was injected into the right knee using a(n) anterior medial approach. The patient tolerated the procedure well. There were no complications. Post procedure instructions were given.  ASSESSMENT & PLAN:  See problem based charting & AVS for pt instructions.

## 2014-06-15 ENCOUNTER — Encounter: Payer: Self-pay | Admitting: Gastroenterology

## 2014-06-15 ENCOUNTER — Ambulatory Visit: Admitting: Gastroenterology

## 2014-06-15 ENCOUNTER — Ambulatory Visit (INDEPENDENT_AMBULATORY_CARE_PROVIDER_SITE_OTHER): Admitting: Gastroenterology

## 2014-06-15 VITALS — BP 120/78 | HR 72 | Ht 67.5 in | Wt 238.0 lb

## 2014-06-15 DIAGNOSIS — K5090 Crohn's disease, unspecified, without complications: Secondary | ICD-10-CM

## 2014-06-15 NOTE — Patient Instructions (Signed)
No changes in med doses. Labs in 3 months (cbc, cmet). Please return to see Dr. Ardis Hughs in 6 months. Start miralax powder, one dose nightly. You can adjust this as needed.

## 2014-06-15 NOTE — Progress Notes (Signed)
Review of gastrointestinal problems:  1.Crohn's ileocolitis: Workup at [facility].Marland KitchenMarland KitchenPromethius IBD Serology 7 testing 02/2008 "pattern consistent with IBD.Marland KitchenMarland KitchenCrohn's." Colonoscopy 01/2008 showed terminal ileum and right colon ulcerations, pathology c/w acute and chronic ileitis, colitis, also HP polyps. Colonoscopy Path report from 2006 showed HP polyps. Office note 05/2009 documented previous C. diff colitis (treated empirically and also after toxin +). 2012 doing well on oral mesalamine 3.6 gms/day. ? Worsening of disease 03/2013, c. Diff PCR neg; increased mesalamine to 4.8gm daily. Colonoscopy 04/2012 found essentially normal colon but moderate to severe ileitis (path confirmed acute and chronic inflammation), started on 229m azathiaprine daily Had erythema nodosum 2014, early, improved with steroids. 11/2012 doing very well on 200 azathiaprine daily. 04/2013: urgency, straining: felt to be more functional then IBD related; fiber supplements stopped. 12/2013 doing well on 200 azathiaprine daily. 2. Dysphasia: EGD August, 2011 found Schatzki's ring that was dilated to 20 mm, small hiatal hernia, tortuous esophagus. Swallowing much improved. March 2012 PPI only every other day. Repeat EGD 03/2013 for dysphagia showed 4cn HH, tortuous distal esophagus, Schatzki's ring that was dilated to 294m  HPI: This is a   very pleasant [age]ear old woman whom I last saw 6 months ago.  Has been doing fine.  Sometimes really constipated, strains a lot at times.   She cut back on fiber supplements for about a week and she feels better.   Past Medical History  Diagnosis Date  . Hypertension   . Crohn's ileocolitis   . C. difficile diarrhea   . Schatzki's ring   . Hiatal hernia   . Allergy   . GERD (gastroesophageal reflux disease)     Past Surgical History  Procedure Laterality Date  . Tonsillectomy and adenoidectomy    . C section x3      x 3  . Cholecystectomy    . Appendectomy    . Wisdom tooth extraction     . Colonoscopy    . Polypectomy      Current Outpatient Prescriptions  Medication Sig Dispense Refill  . acetaminophen-codeine (TYLENOL #3) 300-30 MG per tablet Take 1 tablet by mouth every 4 (four) hours as needed. 60 tablet 0  . azathioprine (IMURAN) 100 MG tablet Take 2 tablets (200 mg total) by mouth daily. 60 tablet 11  . CALCIUM CITRATE PO Take 1 tablet by mouth 2 (two) times daily.    . Cholecalciferol (VITAMIN D-3 PO) Take 1 capsule by mouth at bedtime.    . Cyanocobalamin (VITAMIN B-12 PO) Take 1 tablet by mouth daily.    . Marland KitchenYMISTA 137-50 MCG/ACT SUSP Place 1 spray into both nostrils 2 (two) times daily.  6  . fexofenadine (ALLEGRA) 180 MG tablet Take 180 mg by mouth daily.    . Marland Kitchenevocetirizine (XYZAL) 5 MG tablet Take 5 mg by mouth every evening.    . montelukast (SINGULAIR) 10 MG tablet Take 10 mg by mouth at bedtime.    . Multiple Vitamin (MULITIVITAMIN WITH MINERALS) TABS Take 1 tablet by mouth 2 (two) times daily.    . Omega-3 Fatty Acids (FISH OIL PO) Take 2 capsules by mouth 2 (two) times daily.     . pravastatin (PRAVACHOL) 20 MG tablet Take 1 tablet (20 mg total) by mouth daily. 90 tablet 3  . VOLTAREN 1 % GEL APPLY 2G TO AFFECTED AREA ON SKIN 4 TIMES DAILY 100 g 1  . traMADol (ULTRAM) 50 MG tablet Take 1 tablet (50 mg total) by mouth every 8 (eight) hours as needed. (  Patient not taking: Reported on 06/15/2014) 60 tablet 0   No current facility-administered medications for this visit.    Allergies as of 06/15/2014  . (No Known Allergies)    Family History  Problem Relation Age of Onset  . Heart disease Father   . Colon cancer Neg Hx   . Esophageal cancer Neg Hx   . Rectal cancer Neg Hx   . Stomach cancer Neg Hx     History   Social History  . Marital Status: Married    Spouse Name: N/A  . Number of Children: 3  . Years of Education: N/A   Occupational History  . Retired Pharmacist, hospital    Social History Main Topics  . Smoking status: Never Smoker   .  Smokeless tobacco: Never Used  . Alcohol Use: 0.0 oz/week    4-5 Glasses of wine, 4-5 Standard drinks or equivalent per week     Comment: pt drinks 4-5 drinks only on the weekends  . Drug Use: No  . Sexual Activity: Not on file   Other Topics Concern  . Not on file   Social History Narrative      Physical Exam: BP 120/78 mmHg  Pulse 72  Ht 5' 7.5" (1.715 m)  Wt 238 lb (107.956 kg)  BMI 36.70 kg/m2 Constitutional: generally well-appearing Psychiatric: alert and oriented x3 Abdomen: soft, nontender, nondistended, no obvious ascites, no peritoneal signs, normal bowel sounds     Assessment and plan: 59 y.o. female with  Crohn's ileocolitis  Her symptoms are under very good control on immunomodulators. She will keep that her current dose without changes. She needs repeat CBC and complete metabolic profile in 3 months. She is going to try adding MiraLAX to her daily regimen since she does at times have trouble with straining quite a bit to move her bowels. She will return to see me in 6 months and sooner if needed.

## 2014-08-19 ENCOUNTER — Other Ambulatory Visit: Payer: Self-pay

## 2014-08-22 LAB — CYTOLOGY - PAP

## 2014-09-13 ENCOUNTER — Other Ambulatory Visit: Payer: Self-pay | Admitting: Gastroenterology

## 2014-09-13 NOTE — Telephone Encounter (Signed)
Patient needs an office visit for further refills

## 2014-09-14 DIAGNOSIS — M1710 Unilateral primary osteoarthritis, unspecified knee: Secondary | ICD-10-CM

## 2014-09-14 HISTORY — DX: Unilateral primary osteoarthritis, unspecified knee: M17.10

## 2014-09-15 ENCOUNTER — Other Ambulatory Visit: Payer: Self-pay

## 2014-09-15 DIAGNOSIS — K50119 Crohn's disease of large intestine with unspecified complications: Secondary | ICD-10-CM

## 2014-09-26 ENCOUNTER — Encounter (HOSPITAL_BASED_OUTPATIENT_CLINIC_OR_DEPARTMENT_OTHER): Payer: Self-pay | Admitting: *Deleted

## 2014-09-26 ENCOUNTER — Encounter (HOSPITAL_BASED_OUTPATIENT_CLINIC_OR_DEPARTMENT_OTHER)
Admission: RE | Admit: 2014-09-26 | Discharge: 2014-09-26 | Disposition: A | Discharge status: Home / Self Care | Payer: BLUE CROSS/BLUE SHIELD | Source: Ambulatory Visit | Attending: Orthopedic Surgery | Admitting: Orthopedic Surgery

## 2014-09-26 DIAGNOSIS — M1711 Unilateral primary osteoarthritis, right knee: Secondary | ICD-10-CM | POA: Diagnosis not present

## 2014-09-26 DIAGNOSIS — Z01818 Encounter for other preprocedural examination: Secondary | ICD-10-CM | POA: Insufficient documentation

## 2014-09-26 LAB — SURGICAL PCR SCREEN
MRSA, PCR: NEGATIVE
Staphylococcus aureus: POSITIVE — AB

## 2014-09-27 ENCOUNTER — Other Ambulatory Visit (INDEPENDENT_AMBULATORY_CARE_PROVIDER_SITE_OTHER): Payer: BLUE CROSS/BLUE SHIELD

## 2014-09-27 DIAGNOSIS — K50119 Crohn's disease of large intestine with unspecified complications: Secondary | ICD-10-CM

## 2014-09-27 LAB — CBC WITH DIFFERENTIAL/PLATELET
BASOS ABS: 0 10*3/uL (ref 0.0–0.1)
Basophils Relative: 0.7 % (ref 0.0–3.0)
EOS PCT: 1.2 % (ref 0.0–5.0)
Eosinophils Absolute: 0.1 10*3/uL (ref 0.0–0.7)
HCT: 36.5 % (ref 36.0–46.0)
HEMOGLOBIN: 12.5 g/dL (ref 12.0–15.0)
Lymphocytes Relative: 14.9 % (ref 12.0–46.0)
Lymphs Abs: 0.7 10*3/uL (ref 0.7–4.0)
MCHC: 34.2 g/dL (ref 30.0–36.0)
MCV: 98 fl (ref 78.0–100.0)
MONOS PCT: 7.8 % (ref 3.0–12.0)
Monocytes Absolute: 0.4 10*3/uL (ref 0.1–1.0)
Neutro Abs: 3.5 10*3/uL (ref 1.4–7.7)
Neutrophils Relative %: 75.4 % (ref 43.0–77.0)
PLATELETS: 307 10*3/uL (ref 150.0–400.0)
RBC: 3.73 Mil/uL — ABNORMAL LOW (ref 3.87–5.11)
RDW: 14 % (ref 11.5–15.5)
WBC: 4.6 10*3/uL (ref 4.0–10.5)

## 2014-09-27 LAB — COMPREHENSIVE METABOLIC PANEL
ALT: 11 U/L (ref 0–35)
AST: 19 U/L (ref 0–37)
Albumin: 3.9 g/dL (ref 3.5–5.2)
Alkaline Phosphatase: 41 U/L (ref 39–117)
BILIRUBIN TOTAL: 0.5 mg/dL (ref 0.2–1.2)
BUN: 26 mg/dL — AB (ref 6–23)
CHLORIDE: 104 meq/L (ref 96–112)
CO2: 29 meq/L (ref 19–32)
Calcium: 9.4 mg/dL (ref 8.4–10.5)
Creatinine, Ser: 0.98 mg/dL (ref 0.40–1.20)
GFR: 61.53 mL/min (ref >60.00)
Glucose, Bld: 93 mg/dL (ref 70–99)
Potassium: 4.2 mEq/L (ref 3.5–5.1)
Sodium: 137 mEq/L (ref 135–145)
Total Protein: 6.7 g/dL (ref 6.0–8.3)

## 2014-10-07 ENCOUNTER — Ambulatory Visit (HOSPITAL_BASED_OUTPATIENT_CLINIC_OR_DEPARTMENT_OTHER): Payer: BLUE CROSS/BLUE SHIELD | Admitting: Anesthesiology

## 2014-10-07 ENCOUNTER — Ambulatory Visit (HOSPITAL_BASED_OUTPATIENT_CLINIC_OR_DEPARTMENT_OTHER)
Admission: RE | Admit: 2014-10-07 | Discharge: 2014-10-08 | Disposition: A | Discharge status: Home / Self Care | Payer: BLUE CROSS/BLUE SHIELD | Source: Ambulatory Visit | Attending: Orthopedic Surgery | Admitting: Orthopedic Surgery

## 2014-10-07 ENCOUNTER — Encounter (HOSPITAL_BASED_OUTPATIENT_CLINIC_OR_DEPARTMENT_OTHER): Payer: Self-pay

## 2014-10-07 ENCOUNTER — Ambulatory Visit (HOSPITAL_COMMUNITY): Payer: BLUE CROSS/BLUE SHIELD

## 2014-10-07 ENCOUNTER — Encounter (HOSPITAL_BASED_OUTPATIENT_CLINIC_OR_DEPARTMENT_OTHER)
Admission: RE | Disposition: A | Discharge status: Home / Self Care | Payer: Self-pay | Source: Ambulatory Visit | Attending: Orthopedic Surgery

## 2014-10-07 DIAGNOSIS — J302 Other seasonal allergic rhinitis: Secondary | ICD-10-CM | POA: Insufficient documentation

## 2014-10-07 DIAGNOSIS — M1711 Unilateral primary osteoarthritis, right knee: Secondary | ICD-10-CM | POA: Insufficient documentation

## 2014-10-07 DIAGNOSIS — Z79899 Other long term (current) drug therapy: Secondary | ICD-10-CM | POA: Insufficient documentation

## 2014-10-07 DIAGNOSIS — Z6834 Body mass index (BMI) 34.0-34.9, adult: Secondary | ICD-10-CM | POA: Diagnosis not present

## 2014-10-07 DIAGNOSIS — Z96651 Presence of right artificial knee joint: Secondary | ICD-10-CM

## 2014-10-07 DIAGNOSIS — M85861 Other specified disorders of bone density and structure, right lower leg: Secondary | ICD-10-CM | POA: Insufficient documentation

## 2014-10-07 DIAGNOSIS — M25761 Osteophyte, right knee: Secondary | ICD-10-CM | POA: Diagnosis not present

## 2014-10-07 DIAGNOSIS — E780 Pure hypercholesterolemia: Secondary | ICD-10-CM | POA: Insufficient documentation

## 2014-10-07 HISTORY — PX: PATELLA-FEMORAL ARTHROPLASTY: SHX5037

## 2014-10-07 HISTORY — DX: Unilateral primary osteoarthritis, unspecified knee: M17.10

## 2014-10-07 HISTORY — DX: Personal history of other diseases of the digestive system: Z87.19

## 2014-10-07 HISTORY — DX: Other seasonal allergic rhinitis: J30.2

## 2014-10-07 HISTORY — DX: Unilateral primary osteoarthritis, right knee: M17.11

## 2014-10-07 HISTORY — DX: Pure hypercholesterolemia, unspecified: E78.00

## 2014-10-07 HISTORY — DX: Dental restoration status: Z98.811

## 2014-10-07 HISTORY — DX: Crohn's disease, unspecified, without complications: K50.90

## 2014-10-07 LAB — POCT HEMOGLOBIN-HEMACUE: HEMOGLOBIN: 12.4 g/dL (ref 12.0–15.0)

## 2014-10-07 SURGERY — ARTHROPLASTY, PATELLOFEMORAL
Anesthesia: Regional | Site: Knee | Laterality: Right

## 2014-10-07 MED ORDER — HYDROMORPHONE HCL 1 MG/ML IJ SOLN
INTRAMUSCULAR | Status: AC
  Filled 2014-10-07: qty 1

## 2014-10-07 MED ORDER — OXYCODONE HCL 5 MG PO TABS
5.0000 mg | ORAL_TABLET | ORAL | Status: DC | PRN
  Administered 2014-10-07 – 2014-10-08 (×3): 10 mg via ORAL
  Filled 2014-10-07 (×3): qty 2

## 2014-10-07 MED ORDER — OXYCODONE HCL 5 MG PO TABS: 5.0000 mg | ORAL_TABLET | Freq: Once | ORAL | Status: DC | PRN

## 2014-10-07 MED ORDER — MEPERIDINE HCL 25 MG/ML IJ SOLN: 6.2500 mg | INTRAMUSCULAR | Status: DC | PRN

## 2014-10-07 MED ORDER — HYDROMORPHONE HCL 1 MG/ML IJ SOLN
0.5000 mg | INTRAMUSCULAR | Status: DC | PRN
  Administered 2014-10-07 – 2014-10-08 (×2): 1 mg via INTRAVENOUS
  Filled 2014-10-07 (×2): qty 1

## 2014-10-07 MED ORDER — SENNA 8.6 MG PO TABS
1.0000 | ORAL_TABLET | Freq: Two times a day (BID) | ORAL | Status: DC
  Administered 2014-10-07: 8.6 mg via ORAL
  Filled 2014-10-07: qty 1

## 2014-10-07 MED ORDER — POLYETHYLENE GLYCOL 3350 17 G PO PACK
17.0000 g | PACK | Freq: Every day | ORAL | Status: DC | PRN
Start: 2014-10-07 — End: 2014-10-08

## 2014-10-07 MED ORDER — FENTANYL CITRATE (PF) 100 MCG/2ML IJ SOLN
50.0000 ug | INTRAMUSCULAR | Status: DC | PRN
  Administered 2014-10-07: 100 ug via INTRAVENOUS

## 2014-10-07 MED ORDER — MORPHINE SULFATE 10 MG/ML IJ SOLN
INTRAMUSCULAR | Status: AC
  Filled 2014-10-07: qty 1

## 2014-10-07 MED ORDER — CEFAZOLIN SODIUM 1-5 GM-% IV SOLN
INTRAVENOUS | Status: AC
  Filled 2014-10-07: qty 50

## 2014-10-07 MED ORDER — MIDAZOLAM HCL 2 MG/2ML IJ SOLN
1.0000 mg | INTRAMUSCULAR | Status: DC | PRN
  Administered 2014-10-07: 2 mg via INTRAVENOUS

## 2014-10-07 MED ORDER — SCOPOLAMINE 1 MG/3DAYS TD PT72
1.0000 | MEDICATED_PATCH | Freq: Once | TRANSDERMAL | Status: DC | PRN
  Administered 2014-10-07: 1.5 mg via TRANSDERMAL

## 2014-10-07 MED ORDER — CEFAZOLIN SODIUM 1-5 GM-% IV SOLN
1.0000 g | Freq: Four times a day (QID) | INTRAVENOUS | Status: AC
  Administered 2014-10-07 – 2014-10-08 (×3): 1 g via INTRAVENOUS
  Filled 2014-10-07: qty 50

## 2014-10-07 MED ORDER — ONDANSETRON HCL 4 MG PO TABS: 4.0000 mg | ORAL_TABLET | Freq: Four times a day (QID) | ORAL | Status: DC | PRN

## 2014-10-07 MED ORDER — RIVAROXABAN 10 MG PO TABS: 10.0000 mg | ORAL_TABLET | Freq: Every day | ORAL | Status: DC

## 2014-10-07 MED ORDER — MORPHINE SULFATE 10 MG/ML IJ SOLN
INTRAMUSCULAR | Status: DC | PRN
  Administered 2014-10-07: 3 mg via INTRAVENOUS
  Administered 2014-10-07: 2 mg via INTRAVENOUS

## 2014-10-07 MED ORDER — ZOLPIDEM TARTRATE 5 MG PO TABS: 5.0000 mg | ORAL_TABLET | Freq: Every evening | ORAL | Status: DC | PRN

## 2014-10-07 MED ORDER — SODIUM CHLORIDE 0.9 % IV SOLN
INTRAVENOUS | Status: DC | PRN
  Administered 2014-10-07: 1000 mL

## 2014-10-07 MED ORDER — MIDAZOLAM HCL 2 MG/2ML IJ SOLN
INTRAMUSCULAR | Status: AC
  Filled 2014-10-07: qty 2

## 2014-10-07 MED ORDER — AZATHIOPRINE 100 MG PO TABS
200.0000 mg | ORAL_TABLET | Freq: Every day | ORAL | Status: DC
  Administered 2014-10-08: 200 mg via ORAL

## 2014-10-07 MED ORDER — ACETAMINOPHEN 160 MG/5ML PO SOLN: 960.0000 mg | Freq: Once | ORAL | Status: DC

## 2014-10-07 MED ORDER — LIDOCAINE HCL (CARDIAC) 20 MG/ML IV SOLN
INTRAVENOUS | Status: DC | PRN
  Administered 2014-10-07: 70 mg via INTRAVENOUS

## 2014-10-07 MED ORDER — SODIUM CHLORIDE 0.9 % IV SOLN
INTRAVENOUS | Status: DC
  Administered 2014-10-07: 15:00:00 via INTRAVENOUS

## 2014-10-07 MED ORDER — LACTATED RINGERS IV SOLN
INTRAVENOUS | Status: DC
  Administered 2014-10-07: 10:00:00 via INTRAVENOUS

## 2014-10-07 MED ORDER — METOCLOPRAMIDE HCL 5 MG/ML IJ SOLN: 5.0000 mg | Freq: Three times a day (TID) | INTRAMUSCULAR | Status: DC | PRN

## 2014-10-07 MED ORDER — SCOPOLAMINE 1 MG/3DAYS TD PT72
MEDICATED_PATCH | TRANSDERMAL | Status: AC
  Filled 2014-10-07: qty 1

## 2014-10-07 MED ORDER — METOCLOPRAMIDE HCL 5 MG PO TABS: 5.0000 mg | ORAL_TABLET | Freq: Three times a day (TID) | ORAL | Status: DC | PRN

## 2014-10-07 MED ORDER — ACETAMINOPHEN 500 MG PO TABS: 1000.0000 mg | ORAL_TABLET | Freq: Once | ORAL | Status: DC

## 2014-10-07 MED ORDER — MONTELUKAST SODIUM 10 MG PO TABS: 10.0000 mg | ORAL_TABLET | Freq: Every day | ORAL | Status: DC

## 2014-10-07 MED ORDER — SENNA-DOCUSATE SODIUM 8.6-50 MG PO TABS: 2.0000 | ORAL_TABLET | Freq: Every day | ORAL | Status: DC

## 2014-10-07 MED ORDER — PRAVASTATIN SODIUM 20 MG PO TABS: 20.0000 mg | ORAL_TABLET | Freq: Every day | ORAL | Status: DC

## 2014-10-07 MED ORDER — METHOCARBAMOL 1000 MG/10ML IJ SOLN: 500.0000 mg | Freq: Four times a day (QID) | INTRAVENOUS | Status: DC | PRN

## 2014-10-07 MED ORDER — LORATADINE 10 MG PO TABS: 10.0000 mg | ORAL_TABLET | Freq: Every day | ORAL | Status: DC

## 2014-10-07 MED ORDER — MAGNESIUM CITRATE PO SOLN: 1.0000 | Freq: Once | ORAL | Status: AC | PRN

## 2014-10-07 MED ORDER — BUPIVACAINE-EPINEPHRINE (PF) 0.5% -1:200000 IJ SOLN
INTRAMUSCULAR | Status: DC | PRN
  Administered 2014-10-07: 20 mL via PERINEURAL

## 2014-10-07 MED ORDER — OXYCODONE HCL 5 MG/5ML PO SOLN: 5.0000 mg | Freq: Once | ORAL | Status: DC | PRN

## 2014-10-07 MED ORDER — SCOPOLAMINE 1 MG/3DAYS TD PT72: 1.0000 | MEDICATED_PATCH | TRANSDERMAL | Status: DC

## 2014-10-07 MED ORDER — ONDANSETRON HCL 4 MG/2ML IJ SOLN: 4.0000 mg | Freq: Four times a day (QID) | INTRAMUSCULAR | Status: DC | PRN

## 2014-10-07 MED ORDER — BISACODYL 10 MG RE SUPP
10.0000 mg | Freq: Every day | RECTAL | Status: DC | PRN
Start: 2014-10-07 — End: 2014-10-08

## 2014-10-07 MED ORDER — HYDROMORPHONE HCL 1 MG/ML IJ SOLN
0.2500 mg | INTRAMUSCULAR | Status: DC | PRN
  Administered 2014-10-07 (×3): 0.5 mg via INTRAVENOUS

## 2014-10-07 MED ORDER — CEFAZOLIN SODIUM-DEXTROSE 2-3 GM-% IV SOLR
INTRAVENOUS | Status: AC
  Filled 2014-10-07: qty 50

## 2014-10-07 MED ORDER — METHOCARBAMOL 500 MG PO TABS
500.0000 mg | ORAL_TABLET | Freq: Four times a day (QID) | ORAL | Status: DC | PRN
  Administered 2014-10-07 – 2014-10-08 (×3): 500 mg via ORAL
  Filled 2014-10-07 (×3): qty 1

## 2014-10-07 MED ORDER — PROMETHAZINE HCL 25 MG/ML IJ SOLN
6.2500 mg | Freq: Once | INTRAMUSCULAR | Status: AC
  Administered 2014-10-07: 6.25 mg via INTRAVENOUS

## 2014-10-07 MED ORDER — ADULT MULTIVITAMIN W/MINERALS CH: 1.0000 | ORAL_TABLET | Freq: Two times a day (BID) | ORAL | Status: DC

## 2014-10-07 MED ORDER — ONDANSETRON HCL 4 MG PO TABS: 4.0000 mg | ORAL_TABLET | Freq: Three times a day (TID) | ORAL | Status: DC | PRN

## 2014-10-07 MED ORDER — OXYCODONE-ACETAMINOPHEN 10-325 MG PO TABS: 1.0000 | ORAL_TABLET | Freq: Four times a day (QID) | ORAL | Status: DC | PRN

## 2014-10-07 MED ORDER — MUPIROCIN CALCIUM 2 % NA OINT: 1.0000 "application " | TOPICAL_OINTMENT | Freq: Two times a day (BID) | NASAL | Status: DC

## 2014-10-07 MED ORDER — LEVOCETIRIZINE DIHYDROCHLORIDE 5 MG PO TABS: 5.0000 mg | ORAL_TABLET | Freq: Every evening | ORAL | Status: DC

## 2014-10-07 MED ORDER — PROPOFOL 10 MG/ML IV BOLUS
INTRAVENOUS | Status: DC | PRN
  Administered 2014-10-07: 200 mg via INTRAVENOUS

## 2014-10-07 MED ORDER — OXYCODONE-ACETAMINOPHEN 5-325 MG PO TABS
1.0000 | ORAL_TABLET | ORAL | Status: DC | PRN
  Administered 2014-10-07: 1 via ORAL
  Administered 2014-10-07 – 2014-10-08 (×2): 2 via ORAL
  Filled 2014-10-07: qty 2
  Filled 2014-10-07: qty 1
  Filled 2014-10-07: qty 2

## 2014-10-07 MED ORDER — BACLOFEN 10 MG PO TABS
10.0000 mg | ORAL_TABLET | Freq: Three times a day (TID) | ORAL | Status: DC
Start: 2014-10-07 — End: 2015-02-13

## 2014-10-07 MED ORDER — FENTANYL CITRATE (PF) 100 MCG/2ML IJ SOLN
INTRAMUSCULAR | Status: AC
  Filled 2014-10-07: qty 2

## 2014-10-07 MED ORDER — AZELASTINE-FLUTICASONE 137-50 MCG/ACT NA SUSP: 1.0000 | Freq: Two times a day (BID) | NASAL | Status: DC

## 2014-10-07 MED ORDER — PROMETHAZINE HCL 25 MG/ML IJ SOLN
INTRAMUSCULAR | Status: AC
  Filled 2014-10-07: qty 1

## 2014-10-07 MED ORDER — CEFAZOLIN SODIUM-DEXTROSE 2-3 GM-% IV SOLR
2.0000 g | INTRAVENOUS | Status: AC
  Administered 2014-10-07: 2 g via INTRAVENOUS

## 2014-10-07 MED ORDER — DEXAMETHASONE SODIUM PHOSPHATE 4 MG/ML IJ SOLN
INTRAMUSCULAR | Status: DC | PRN
  Administered 2014-10-07: 10 mg via INTRAVENOUS

## 2014-10-07 MED ORDER — ROPIVACAINE HCL 5 MG/ML IJ SOLN
INTRAMUSCULAR | Status: DC | PRN
  Administered 2014-10-07: 20 mL via PERINEURAL

## 2014-10-07 MED ORDER — DOCUSATE SODIUM 100 MG PO CAPS
100.0000 mg | ORAL_CAPSULE | Freq: Two times a day (BID) | ORAL | Status: DC
  Administered 2014-10-07: 100 mg via ORAL
  Filled 2014-10-07: qty 1

## 2014-10-07 SURGICAL SUPPLY — 86 items
BAG DECANTER FOR FLEXI CONT (MISCELLANEOUS) IMPLANT
BANDAGE ELASTIC 4 VELCRO ST LF (GAUZE/BANDAGES/DRESSINGS) ×3 IMPLANT
BANDAGE ELASTIC 6 VELCRO ST LF (GAUZE/BANDAGES/DRESSINGS) ×3 IMPLANT
BANDAGE ESMARK 6X9 LF (GAUZE/BANDAGES/DRESSINGS) ×1 IMPLANT
BENZOIN TINCTURE PRP APPL 2/3 (GAUZE/BANDAGES/DRESSINGS) IMPLANT
BLADE SAG 18X100X1.27 (BLADE) IMPLANT
BLADE SAGITTAL 25.0X1.37X90 (BLADE) ×2 IMPLANT
BLADE SAGITTAL 25.0X1.37X90MM (BLADE) ×1
BLADE SAW SGTL 13.0X1.19X90.0M (BLADE) IMPLANT
BLADE SURG 10 STRL SS (BLADE) ×3 IMPLANT
BLADE SURG 15 STRL LF DISP TIS (BLADE) ×3 IMPLANT
BLADE SURG 15 STRL SS (BLADE) ×6
BNDG ESMARK 6X9 LF (GAUZE/BANDAGES/DRESSINGS) ×3
BOWL SMART MIX CTS (DISPOSABLE) ×3 IMPLANT
BUR SURG 4X8 MED (BURR) IMPLANT
BURR SURG 4MMX8MM MEDIUM (BURR)
BURR SURG 4X8 MED (BURR)
CEMENT HV SMART SET (Cement) ×3 IMPLANT
CLOSURE STERI-STRIP 1/2X4 (GAUZE/BANDAGES/DRESSINGS) ×1
CLOSURE WOUND 1/2 X4 (GAUZE/BANDAGES/DRESSINGS)
CLSR STERI-STRIP ANTIMIC 1/2X4 (GAUZE/BANDAGES/DRESSINGS) ×2 IMPLANT
COVER BACK TABLE 60X90IN (DRAPES) ×3 IMPLANT
CUFF TOURNIQUET SINGLE 34IN LL (TOURNIQUET CUFF) ×3 IMPLANT
DECANTER SPIKE VIAL GLASS SM (MISCELLANEOUS) IMPLANT
DRAPE EXTREMITY T 121X128X90 (DRAPE) ×3 IMPLANT
DRAPE INCISE IOBAN 66X45 STRL (DRAPES) IMPLANT
DRAPE U-SHAPE 47X51 STRL (DRAPES) ×3 IMPLANT
DURAPREP 26ML APPLICATOR (WOUND CARE) ×3 IMPLANT
ELECT REM PT RETURN 9FT ADLT (ELECTROSURGICAL) ×3
ELECTRODE REM PT RTRN 9FT ADLT (ELECTROSURGICAL) ×1 IMPLANT
FACESHIELD WRAPAROUND (MASK) ×6 IMPLANT
GAUZE SPONGE 4X4 12PLY STRL (GAUZE/BANDAGES/DRESSINGS) ×3 IMPLANT
GAUZE XEROFORM 5X9 LF (GAUZE/BANDAGES/DRESSINGS) IMPLANT
GLOVE BIO SURGEON STRL SZ7.5 (GLOVE) IMPLANT
GLOVE BIO SURGEON STRL SZ8 (GLOVE) ×3 IMPLANT
GLOVE BIOGEL M STRL SZ7.5 (GLOVE) ×3 IMPLANT
GLOVE BIOGEL PI IND STRL 7.0 (GLOVE) ×3 IMPLANT
GLOVE BIOGEL PI IND STRL 8 (GLOVE) ×2 IMPLANT
GLOVE BIOGEL PI INDICATOR 7.0 (GLOVE) ×6
GLOVE BIOGEL PI INDICATOR 8 (GLOVE) ×4
GLOVE ECLIPSE 6.5 STRL STRAW (GLOVE) ×6 IMPLANT
GLOVE ORTHO TXT STRL SZ7.5 (GLOVE) ×3 IMPLANT
GOWN STRL REUS W/ TWL LRG LVL3 (GOWN DISPOSABLE) ×2 IMPLANT
GOWN STRL REUS W/ TWL XL LVL3 (GOWN DISPOSABLE) ×3 IMPLANT
GOWN STRL REUS W/TWL LRG LVL3 (GOWN DISPOSABLE) ×4
GOWN STRL REUS W/TWL XL LVL3 (GOWN DISPOSABLE) ×6
HANDPIECE INTERPULSE COAX TIP (DISPOSABLE) ×2
IMMOBILIZER KNEE 22 UNIV (SOFTGOODS) ×3 IMPLANT
IMMOBILIZER KNEE 24 THIGH 36 (MISCELLANEOUS) IMPLANT
IMMOBILIZER KNEE 24 UNIV (MISCELLANEOUS)
KIT PIN (KITS) IMPLANT
KNEE WRAP E Z 3 GEL PACK (MISCELLANEOUS) ×3 IMPLANT
NS IRRIG 1000ML POUR BTL (IV SOLUTION) ×3 IMPLANT
OFFSET FEMORAL XLG 10X5MM (Miscellaneous) ×3 IMPLANT
PACK ARTHROSCOPY DSU (CUSTOM PROCEDURE TRAY) ×3 IMPLANT
PACK BASIN DAY SURGERY FS (CUSTOM PROCEDURE TRAY) ×3 IMPLANT
PAD CAST 4YDX4 CTTN HI CHSV (CAST SUPPLIES) IMPLANT
PADDING CAST COTTON 4X4 STRL (CAST SUPPLIES)
PADDING CAST COTTON 6X4 STRL (CAST SUPPLIES) ×3 IMPLANT
PATELLA TRIATHLON SZ 29 9 MM (Orthopedic Implant) ×3 IMPLANT
PENCIL BUTTON HOLSTER BLD 10FT (ELECTRODE) IMPLANT
POST TAPER 11MM (Orthopedic Implant) ×3 IMPLANT
SET HNDPC FAN SPRY TIP SCT (DISPOSABLE) ×1 IMPLANT
SHEET MEDIUM DRAPE 40X70 STRL (DRAPES) ×6 IMPLANT
SLEEVE SCD COMPRESS KNEE MED (MISCELLANEOUS) IMPLANT
SPONGE LAP 18X18 X RAY DECT (DISPOSABLE) ×3 IMPLANT
STAPLER VISISTAT 35W (STAPLE) IMPLANT
STRIP CLOSURE SKIN 1/2X4 (GAUZE/BANDAGES/DRESSINGS) IMPLANT
SUCTION FRAZIER TIP 10 FR DISP (SUCTIONS) ×3 IMPLANT
SUT FIBERWIRE #2 38 T-5 BLUE (SUTURE)
SUT MNCRL AB 4-0 PS2 18 (SUTURE) IMPLANT
SUT VIC AB 0 CT1 27 (SUTURE) ×2
SUT VIC AB 0 CT1 27XBRD ANBCTR (SUTURE) ×1 IMPLANT
SUT VIC AB 0 SH 27 (SUTURE) IMPLANT
SUT VIC AB 1 CT1 27 (SUTURE)
SUT VIC AB 1 CT1 27XBRD ANBCTR (SUTURE) IMPLANT
SUT VIC AB 2-0 SH 27 (SUTURE) ×2
SUT VIC AB 2-0 SH 27XBRD (SUTURE) ×1 IMPLANT
SUT VICRYL 3-0 CR8 SH (SUTURE) ×3 IMPLANT
SUTURE FIBERWR #2 38 T-5 BLUE (SUTURE) IMPLANT
SYR BULB 3OZ (MISCELLANEOUS) ×3 IMPLANT
TOWEL OR 17X24 6PK STRL BLUE (TOWEL DISPOSABLE) ×3 IMPLANT
TOWEL OR NON WOVEN STRL DISP B (DISPOSABLE) ×6 IMPLANT
TUBE CONNECTING 20'X1/4 (TUBING)
TUBE CONNECTING 20X1/4 (TUBING) IMPLANT
YANKAUER SUCT BULB TIP NO VENT (SUCTIONS) ×3 IMPLANT

## 2014-10-07 NOTE — Anesthesia Preprocedure Evaluation (Addendum)
Anesthesia Evaluation  Patient identified by MRN, date of birth, ID band Patient awake    Reviewed: Allergy & Precautions, NPO status , Patient's Chart, lab work & pertinent test results  Airway Mallampati: I  TM Distance: >3 FB Neck ROM: Full    Dental  (+) Teeth Intact, Dental Advisory Given   Pulmonary  breath sounds clear to auscultation        Cardiovascular Rhythm:Regular Rate:Normal     Neuro/Psych    GI/Hepatic hiatal hernia,   Endo/Other  Morbid obesity  Renal/GU      Musculoskeletal   Abdominal   Peds  Hematology   Anesthesia Other Findings   Reproductive/Obstetrics                          Anesthesia Physical Anesthesia Plan  ASA: II  Anesthesia Plan: General and Regional   Post-op Pain Management:    Induction: Intravenous  Airway Management Planned: LMA  Additional Equipment:   Intra-op Plan:   Post-operative Plan: Extubation in OR  Informed Consent: I have reviewed the patients History and Physical, chart, labs and discussed the procedure including the risks, benefits and alternatives for the proposed anesthesia with the patient or authorized representative who has indicated his/her understanding and acceptance.   Dental advisory given  Plan Discussed with: CRNA, Anesthesiologist and Surgeon  Anesthesia Plan Comments:         Anesthesia Quick Evaluation

## 2014-10-07 NOTE — Transfer of Care (Signed)
Immediate Anesthesia Transfer of Care Note  Patient: Connie Porter  Procedure(s) Performed: Procedure(s): RIGHT PATELLA-FEMORAL ARTHROPLASTY (Right)  Patient Location: PACU  Anesthesia Type:General  Level of Consciousness: awake and sedated  Airway & Oxygen Therapy: Patient Spontanous Breathing and Patient connected to face mask oxygen  Post-op Assessment: Report given to RN and Post -op Vital signs reviewed and stable  Post vital signs: Reviewed and stable  Last Vitals:  Filed Vitals:   10/07/14 1230  BP:   Pulse:   Temp: 36.6 C  Resp:     Complications: No apparent anesthesia complications

## 2014-10-07 NOTE — Anesthesia Postprocedure Evaluation (Signed)
  Anesthesia Post-op Note  Patient: Industrial/product designer  Procedure(s) Performed: Procedure(s) (LRB): RIGHT PATELLA-FEMORAL ARTHROPLASTY (Right)  Patient Location: PACU  Anesthesia Type: General  Level of Consciousness: awake and alert   Airway and Oxygen Therapy: Patient Spontanous Breathing  Post-op Pain: mild  Post-op Assessment: Post-op Vital signs reviewed, Patient's Cardiovascular Status Stable, Respiratory Function Stable, Patent Airway and No signs of Nausea or vomiting  Last Vitals:  Filed Vitals:   10/07/14 1430  BP: 117/71  Pulse:   Temp: 36.6 C  Resp:     Post-op Vital Signs: stable   Complications: No apparent anesthesia complications

## 2014-10-07 NOTE — Anesthesia Procedure Notes (Addendum)
Anesthesia Regional Block:  Adductor canal block  Pre-Anesthetic Checklist: ,, timeout performed, Correct Patient, Correct Site, Correct Laterality, Correct Procedure, Correct Position, site marked, Risks and benefits discussed,  Surgical consent,  Pre-op evaluation,  At surgeon's request and post-op pain management  Laterality: Right and Lower  Prep: chloraprep       Needles:  Injection technique: Single-shot  Needle Type: Echogenic Needle     Needle Length: 9cm 9 cm Needle Gauge: 21 and 21 G    Additional Needles:  Procedures: ultrasound guided (picture in chart) Adductor canal block Narrative:  Start time: 10/07/2014 10:12 AM End time: 10/07/2014 10:17 AM Injection made incrementally with aspirations every 5 mL.  Performed by: Personally  Anesthesiologist: CREWS, DAVID   Procedure Name: LMA Insertion Performed by: Terrance Mass Pre-anesthesia Checklist: Patient identified, Timeout performed, Emergency Drugs available, Suction available and Patient being monitored Patient Re-evaluated:Patient Re-evaluated prior to inductionOxygen Delivery Method: Circle system utilized Preoxygenation: Pre-oxygenation with 100% oxygen Intubation Type: IV induction Ventilation: Mask ventilation without difficulty LMA: LMA inserted LMA Size: 4.0 Number of attempts: 1 Placement Confirmation: positive ETCO2 Tube secured with: Tape Dental Injury: Teeth and Oropharynx as per pre-operative assessment

## 2014-10-07 NOTE — H&P (Signed)
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 Symptoms are rated as moderate to severe, and have been worsening.  This is significantly impairing activities of daily living.  She has elected for surgical management. She has had several years of dysfunction of the knee, she has been seen by sports medicine center and had multiple injections, she's had transient improvement with injections but continues with severe pain. She describes popping and grinding and catching of the knee. She gets pain with stooping and squatting as well. She has tried Voltaren gel.  She has failed injections, activity modification, anti-inflammatories, and assistive devices.  Preoperative X-rays demonstrate end stage degenerative changes with osteophyte formation, loss of joint space, subchondral sclerosis.   Past Medical History  Diagnosis Date  . Crohn's disease   . History of hiatal hernia   . Osteoarthritis of knee, unilateral 09/2014    right   . Dental crowns present   . Seasonal allergies   . High cholesterol    Past Surgical History  Procedure Laterality Date  . Tonsillectomy and adenoidectomy    . Cholecystectomy    . Appendectomy    . Wisdom tooth extraction    . Colonoscopy    . Cesarean section      x 3  . Esophagogastroduodenoscopy (egd) with esophageal dilation      x 2   History   Social History  . Marital Status: Married    Spouse Name: N/A  . Number of Children: 3  . Years of Education: N/A   Occupational History  . Retired Pharmacist, hospital    Social History Main Topics  . Smoking status: Never Smoker   . Smokeless tobacco: Never Used  . Alcohol Use: Yes     Comment: weekends  . Drug Use: No  . Sexual Activity: Not on file   Other Topics Concern  . None   Social History  Narrative   Family History  Problem Relation Age of Onset  . Heart disease Father    No Known Allergies Prior to Admission medications   Medication Sig Start Date End Date Taking? Authorizing Provider  AZASAN 100 MG tablet TAKE 2 TABLETS BY MOUTH ONCE DAILY 09/13/14  Yes Milus Banister, MD  CALCIUM CITRATE PO Take 1 tablet by mouth 2 (two) times daily.   Yes Historical Provider, MD  Cholecalciferol (VITAMIN D-3 PO) Take 1 capsule by mouth at bedtime.   Yes Historical Provider, MD  Cyanocobalamin (VITAMIN B-12 PO) Take 1 tablet by mouth daily.   Yes Historical Provider, MD  fexofenadine (ALLEGRA) 180 MG tablet Take 180 mg by mouth daily.   Yes Historical Provider, MD  levocetirizine (XYZAL) 5 MG tablet Take 5 mg by mouth every evening.   Yes Historical Provider, MD  montelukast (SINGULAIR) 10 MG tablet Take 10 mg by mouth at bedtime.   Yes Historical Provider, MD  Multiple Vitamin (MULITIVITAMIN WITH MINERALS) TABS Take 1 tablet by mouth 2 (two) times daily.   Yes Historical Provider, MD  Omega-3 Fatty Acids (FISH OIL PO) Take 2 capsules by mouth 2 (two) times daily.    Yes Historical Provider, MD  pravastatin (PRAVACHOL) 20 MG tablet Take 1 tablet (20 mg total) by mouth daily. 01/13/14  Yes Tresa Garter, MD  Short Ragweed Pollen Ext (RAGWITEK) 12 AMB A 1-U SUBL Place 1  tablet under the tongue daily.   Yes Historical Provider, MD  DYMISTA 137-50 MCG/ACT SUSP Place 1 spray into both nostrils 2 (two) times daily. 05/23/14   Historical Provider, MD     Positive ROS: All other systems have been reviewed and were otherwise negative with the exception of those mentioned in the HPI and as above.  Physical Exam: General: Alert, no acute distress Cardiovascular: No pedal edema Respiratory: No cyanosis, no use of accessory musculature GI: No organomegaly, abdomen is soft and non-tender Skin: No lesions in the area of chief complaint Neurologic: Sensation intact distally Psychiatric: Patient  is competent for consent with normal mood and affect Lymphatic: No axillary or cervical lymphadenopathy  MUSCULOSKELETAL: Right knee has positive patellar grind, no joint line tenderness or effusion, full motion. Intact Lachman.  Assessment: Right knee advanced patellofemoral osteoarthritis  Plan: Plan for Procedure(s): RIGHT PATELLA-FEMORAL ARTHROPLASTY  The risks benefits and alternatives were discussed with the patient including but not limited to the risks of nonoperative treatment, versus surgical intervention including infection, bleeding, nerve injury,  blood clots, cardiopulmonary complications, morbidity, mortality, among others, and they were willing to proceed.   Johnny Bridge, MD Cell [phone]   10/07/2014 7:17 AM

## 2014-10-07 NOTE — Op Note (Signed)
10/07/2014  12:12 PM  PATIENT:  Connie Porter    PRE-OPERATIVE DIAGNOSIS:  Right knee patellofemoral osteoarthritis  POST-OPERATIVE DIAGNOSIS:  Same  PROCEDURE:  RIGHT PATELLOFEMORAL ARTHROPLASTY  SURGEON:  Johnny Bridge, MD  PHYSICIAN ASSISTANT: Joya Gaskins, OPA-C, present and scrubbed throughout the case, critical for completion in a timely fashion, and for retraction, instrumentation, and closure.  ANESTHESIA:   General  PREOPERATIVE INDICATIONS:  Connie Porter is a  59 y.o. 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    The risks benefits and alternatives were discussed with the patient preoperatively including but not limited to the risks of infection, bleeding, nerve injury, cardiopulmonary complications, the need for revision surgery, among others, and the patient was willing to proceed.  OPERATIVE IMPLANTS: Arthrosurface Hemicap Wave Resurfacing system, femoral trochlea size  +10 x 5 mm with a 11 mm tapered post and a Stryker size 9 mm x 29 asymmetric patella  OPERATIVE FINDINGS: Extensive end-stage grade 4 changes on the patella and the femoral trochlea. The medial and lateral compartments were normal, and the anterior cruciate ligament and PCL were normal. The patella tracked with a no thumb technique after all the implants were in.  OPERATIVE PROCEDURE: The patient was brought to the operating room and placed in the supine position. Gen. anesthesia was administered. IV antibiotics were given. The right lower extremity was prepped and draped in usual sterile fashion. Time out was performed. The leg was elevated and exsanguinated and a tourniquet was inflated. Medial parapatellar arthrotomy carried out through a standard approach, and the patella was everted and the osteophytes removed. I then placed the patella back neutral, moved it laterally, exposed the femoral  trochlea.  I placed the first footed retractor to centralize the position of the guidepin, and then placed a guidepin in appropriate position.  I then measured the articular contour on both planes and this sized to a size 10 from front to back, and a 5 from medial to lateral.  I then reamed over the guide pin removing with the central reamer.  I then placed the guide block, and pinned it with 3 pins. I then assembled the jig construct, and then reamed over it to the appropriate laser lines superiorly and inferiorly both with the outer reamer and the edge reamer.  I then removed the pins and removed the jig, and assembled the sizing trial. The trial was then pinned in place, and the central guidepin reinserted using the appropriate handle. I then drilled with the outer pilot drill, and then placed the step drill down over the pilot drill. I then used a tap to prepare the final implantation for the central peg.  I then removed all of the instruments, and cleaned the surface, and then placed the central peg using the appropriate jig.  This was at the appropriate depth.  I then selected the real implant, irrigated the surfaces and make sure there was no debris, and then inserted the real implant and had excellent secure fixation with flush contour to the cartilaginous surface.  I then turned my attention to the patella, and it measured 21 mm at its thickest, I performed a freehand resection of the patella, bringing it down to 13 mm. The lateral facet actually only measured 15 mm prior to the resection.  I then measured the thickness, and the appropriate width, and it sized to a 29 and eccentric fit better than the standard status  was selected, and I held this in place with the jig, drilled my holes, and then trialed with the button and was found to have excellent tracking and appropriate interaction with the femoral prosthesis.  I then irrigated copiously, and cemented the real button into place,  and then once the cement had cured all loose cement was removed, the knee was taken through functional range of motion at excellent stability with excellent tracking and did not require lateral release. After copious irrigation the parapatellar tissue was repaired with Vicryl, followed by Vicryl for the subcutaneous tennis tissue with standard closure for the skin with Steri-Strips and sterile gauze. She had a preoperative femoral nerve block. She was awakened and returned to the PACU in stable and satisfactory condition. There were no complications and she tolerated the procedure well.

## 2014-10-08 DIAGNOSIS — M1711 Unilateral primary osteoarthritis, right knee: Secondary | ICD-10-CM | POA: Diagnosis not present

## 2014-10-08 MED ORDER — CEFAZOLIN SODIUM 1-5 GM-% IV SOLN
INTRAVENOUS | Status: AC
  Filled 2014-10-08: qty 50

## 2014-10-08 NOTE — Discharge Instructions (Signed)
INSTRUCTIONS AFTER JOINT REPLACEMENT   o Remove items at home which could result in a fall. This includes throw rugs or furniture in walking pathways o ICE to the affected joint every three hours while awake for 30 minutes at a time, for at least the first 3-5 days, and then as needed for pain and swelling.  Continue to use ice for pain and swelling. You may notice swelling that will progress down to the foot and ankle.  This is normal after surgery.  Elevate your leg when you are not up walking on it.   o Continue to use the breathing machine you got in the hospital (incentive spirometer) which will help keep your temperature down.  It is common for your temperature to cycle up and down following surgery, especially at night when you are not up moving around and exerting yourself.  The breathing machine keeps your lungs expanded and your temperature down.   DIET:  As you were doing prior to hospitalization, we recommend a well-balanced diet.  DRESSING / WOUND CARE / SHOWERING  You may change your dressing 3-5 days after surgery.  Then change the dressing every day with sterile gauze.  Please use good hand washing techniques before changing the dressing.  Do not use any lotions or creams on the incision until instructed by your surgeon.  ACTIVITY  o Increase activity slowly as tolerated, but follow the weight bearing instructions below.   o No driving for 6 weeks or until further direction given by your physician.  You cannot drive while taking narcotics.  o No lifting or carrying greater than 10 lbs. until further directed by your surgeon. o Avoid periods of inactivity such as sitting longer than an hour when not asleep. This helps prevent blood clots.  o You may return to work once you are authorized by your doctor.     WEIGHT BEARING   Weight bearing as tolerated with assist device (walker, cane, etc) as directed, use it as long as suggested by your surgeon or therapist, typically at  least 4-6 weeks.   EXERCISES  Results after joint replacement surgery are often greatly improved when you follow the exercise, range of motion and muscle strengthening exercises prescribed by your doctor. Safety measures are also important to protect the joint from further injury. Any time any of these exercises cause you to have increased pain or swelling, decrease what you are doing until you are comfortable again and then slowly increase them. If you have problems or questions, call your caregiver or physical therapist for advice.   Rehabilitation is important following a joint replacement. After just a few days of immobilization, the muscles of the leg can become weakened and shrink (atrophy).  These exercises are designed to build up the tone and strength of the thigh and leg muscles and to improve motion. Often times heat used for twenty to thirty minutes before working out will loosen up your tissues and help with improving the range of motion but do not use heat for the first two weeks following surgery (sometimes heat can increase post-operative swelling).   These exercises can be done on a training (exercise) mat, on the floor, on a table or on a bed. Use whatever works the best and is most comfortable for you.    Use music or television while you are exercising so that the exercises are a pleasant break in your day. This will make your life better with the exercises acting as a break  in your routine that you can look forward to.   Perform all exercises about fifteen times, three times per day or as directed.  You should exercise both the operative leg and the other leg as well.  Exercises include:    Quad Sets - Tighten up the muscle on the front of the thigh (Quad) and hold for 5-10 seconds.    Straight Leg Raises - With your knee straight (if you were given a brace, keep it on), lift the leg to 60 degrees, hold for 3 seconds, and slowly lower the leg.  Perform this exercise against  resistance later as your leg gets stronger.   Leg Slides: Lying on your back, slowly slide your foot toward your buttocks, bending your knee up off the floor (only go as far as is comfortable). Then slowly slide your foot back down until your leg is flat on the floor again.   Angel Wings: Lying on your back spread your legs to the side as far apart as you can without causing discomfort.   Hamstring Strength:  Lying on your back, push your heel against the floor with your leg straight by tightening up the muscles of your buttocks.  Repeat, but this time bend your knee to a comfortable angle, and push your heel against the floor.  You may put a pillow under the heel to make it more comfortable if necessary.   A rehabilitation program following joint replacement surgery can speed recovery and prevent re-injury in the future due to weakened muscles. Contact your doctor or a physical therapist for more information on knee rehabilitation.    CONSTIPATION  Constipation is defined medically as fewer than three stools per week and severe constipation as less than one stool per week.  Even if you have a regular bowel pattern at home, your normal regimen is likely to be disrupted due to multiple reasons following surgery.  Combination of anesthesia, postoperative narcotics, change in appetite and fluid intake all can affect your bowels.   YOU MUST use at least one of the following options; they are listed in order of increasing strength to get the job done.  They are all available over the counter, and you may need to use some, POSSIBLY even all of these options:    Drink plenty of fluids (prune juice may be helpful) and high fiber foods Colace 100 mg by mouth twice a day  Senokot for constipation as directed and as needed Dulcolax (bisacodyl), take with full glass of water  Miralax (polyethylene glycol) once or twice a day as needed.  If you have tried all these things and are unable to have a bowel  movement in the first 3-4 days after surgery call either your surgeon or your primary doctor.    If you experience loose stools or diarrhea, hold the medications until you stool forms back up.  If your symptoms do not get better within 1 week or if they get worse, check with your doctor.  If you experience "the worst abdominal pain ever" or develop nausea or vomiting, please contact the office immediately for further recommendations for treatment.   ITCHING:  If you experience itching with your medications, try taking only a single pain pill, or even half a pain pill at a time.  You can also use Benadryl over the counter for itching or also to help with sleep.   TED HOSE STOCKINGS:  Use stockings on both legs until for at least 2 weeks or as  directed by physician office. They may be removed at night for sleeping.  MEDICATIONS:  See your medication summary on the After Visit Summary that nursing will review with you.  You may have some home medications which will be placed on hold until you complete the course of blood thinner medication.  It is important for you to complete the blood thinner medication as prescribed.  PRECAUTIONS:  If you experience chest pain or shortness of breath - call 911 immediately for transfer to the hospital emergency department.   If you develop a fever greater that 101 F, purulent drainage from wound, increased redness or drainage from wound, foul odor from the wound/dressing, or calf pain - CONTACT YOUR SURGEON.                                                   FOLLOW-UP APPOINTMENTS:  If you do not already have a post-op appointment, please call the office for an appointment to be seen by your surgeon.  Guidelines for how soon to be seen are listed in your After Visit Summary, but are typically between 1-4 weeks after surgery.  OTHER INSTRUCTIONS:   Knee Replacement:  Do not place pillow under knee, focus on keeping the knee straight while resting. CPM  instructions: 0-90 degrees, 2 hours in the morning, 2 hours in the afternoon, and 2 hours in the evening. Place foam block, curve side up under heel at all times except when in CPM or when walking.  DO NOT modify, tear, cut, or change the foam block in any way.  MAKE SURE YOU:   Understand these instructions.   Get help right away if you are not doing well or get worse.    Thank you for letting us be a part of your medical care team.  It is a privilege we respect greatly.  We hope these instructions will help you stay on track for a fast and full recovery!   Regional Anesthesia Blocks  1. Numbness or the inability to move the "blocked" extremity may last from 3-48 hours after placement. The length of time depends on the medication injected and your individual response to the medication. If the numbness is not going away after 48 hours, call your surgeon.  2. The extremity that is blocked will need to be protected until the numbness is gone and the  Strength has returned. Because you cannot feel it, you will need to take extra care to avoid injury. Because it may be weak, you may have difficulty moving it or using it. You may not know what position it is in without looking at it while the block is in effect.  3. For blocks in the legs and feet, returning to weight bearing and walking needs to be done carefully. You will need to wait until the numbness is entirely gone and the strength has returned. You should be able to move your leg and foot normally before you try and bear weight or walk. You will need someone to be with you when you first try to ensure you do not fall and possibly risk injury.  4. Bruising and tenderness at the needle site are common side effects and will resolve in a few days.  5. Persistent numbness or new problems with movement should be communicated to the surgeon or the [facility] [phone] [place] [phone]).

## 2014-10-10 ENCOUNTER — Encounter (HOSPITAL_BASED_OUTPATIENT_CLINIC_OR_DEPARTMENT_OTHER): Payer: Self-pay | Admitting: Orthopedic Surgery

## 2014-10-11 ENCOUNTER — Encounter: Payer: Self-pay | Admitting: Gastroenterology

## 2014-10-13 ENCOUNTER — Telehealth: Payer: Self-pay | Admitting: Gastroenterology

## 2014-10-13 NOTE — Telephone Encounter (Signed)
Pt was advised to try miralax 2 caps daily and call her surgeon to make them aware.  Pt is already taking stool softeners and has increased water intake.  She will call with any questions or concerns after speaking with the surgeon.

## 2014-11-21 ENCOUNTER — Telehealth: Payer: Self-pay | Admitting: Gastroenterology

## 2014-11-22 NOTE — Telephone Encounter (Signed)
Pt had knee surgery and became impacted after that. Her surgeon treated that but since then she has had bleeding hemorrhoids.  She has been using tucks pads for the external hemorrhoids but she feels like there are internal ones as well.  She was advised to use prep H and her appt was moved up to 12/02/14.  She was told to keep bowels soft and to call if the bleeding does not continue to decrease.  I will forward to Dr Ardis Hughs for review.

## 2014-11-22 NOTE — Telephone Encounter (Signed)
Patient calling back regarding this.

## 2014-11-23 NOTE — Telephone Encounter (Signed)
I agree. Thanks.

## 2014-12-02 ENCOUNTER — Other Ambulatory Visit (INDEPENDENT_AMBULATORY_CARE_PROVIDER_SITE_OTHER): Payer: BLUE CROSS/BLUE SHIELD

## 2014-12-02 ENCOUNTER — Encounter: Payer: Self-pay | Admitting: Gastroenterology

## 2014-12-02 ENCOUNTER — Ambulatory Visit (INDEPENDENT_AMBULATORY_CARE_PROVIDER_SITE_OTHER): Payer: BLUE CROSS/BLUE SHIELD | Admitting: Gastroenterology

## 2014-12-02 VITALS — BP 106/74 | HR 96 | Ht 67.0 in | Wt 227.4 lb

## 2014-12-02 DIAGNOSIS — K625 Hemorrhage of anus and rectum: Secondary | ICD-10-CM | POA: Diagnosis not present

## 2014-12-02 LAB — CBC WITH DIFFERENTIAL/PLATELET
Basophils Absolute: 0 10*3/uL (ref 0.0–0.1)
Basophils Relative: 0.9 % (ref 0.0–3.0)
EOS PCT: 1.8 % (ref 0.0–5.0)
Eosinophils Absolute: 0.1 10*3/uL (ref 0.0–0.7)
HCT: 37 % (ref 36.0–46.0)
Hemoglobin: 12.9 g/dL (ref 12.0–15.0)
LYMPHS ABS: 0.5 10*3/uL — AB (ref 0.7–4.0)
Lymphocytes Relative: 13.8 % (ref 12.0–46.0)
MCHC: 34.8 g/dL (ref 30.0–36.0)
MCV: 98.9 fl (ref 78.0–100.0)
MONO ABS: 0.3 10*3/uL (ref 0.1–1.0)
Monocytes Relative: 8.2 % (ref 3.0–12.0)
Neutro Abs: 2.5 10*3/uL (ref 1.4–7.7)
Neutrophils Relative %: 75.3 % (ref 43.0–77.0)
Platelets: 289 10*3/uL (ref 150.0–400.0)
RBC: 3.74 Mil/uL — ABNORMAL LOW (ref 3.87–5.11)
RDW: 14.8 % (ref 11.5–15.5)
WBC: 3.3 10*3/uL — ABNORMAL LOW (ref 4.0–10.5)

## 2014-12-02 MED ORDER — HYDROCORTISONE ACE-PRAMOXINE 2.5-1 % RE CREA: 1.0000 "application " | TOPICAL_CREAM | Freq: Two times a day (BID) | RECTAL | Status: DC

## 2014-12-02 NOTE — Patient Instructions (Addendum)
You will have labs checked today in the basement lab.  Please head down after you check out with the front desk  (cbc). Sitz baths twice daily for 20 min. We have given you information on this. Analpram ointment after each bath (new prescription was called in today). Please start taking citrucel (orange flavored) powder fiber supplement.  This may cause some bloating at first but that usually goes away. Begin with a small spoonful and work your way up to a large, heaping spoonful daily over a week. Call in 2 weeks to update me on your progress. (phone [phone])

## 2014-12-02 NOTE — Progress Notes (Signed)
Review of gastrointestinal problems:  1.Crohn's ileocolitis: Workup at [facility]land Kitchen[facility] IBD Serology 7 testing 02/2008 "pattern consistent with IBD.Marland KitchenMarland KitchenCrohn's." Colonoscopy 01/2008 showed terminal ileum and right colon ulcerations, pathology c/w acute and chronic ileitis, colitis, also HP polyps. Colonoscopy Path report from 2006 showed HP polyps. Office note 05/2009 documented previous C. diff colitis (treated empirically and also after toxin +). 2012 doing well on oral mesalamine 3.6 gms/day. ? Worsening of disease 03/2013, c. Diff PCR neg; increased mesalamine to 4.8gm daily. Colonoscopy 04/2012 found essentially normal colon but moderate to severe ileitis (path confirmed acute and chronic inflammation), started on 271m azathiaprine daily Had erythema nodosum 2014, early, improved with steroids. 11/2012 doing very well on 200 azathiaprine daily. 04/2013: urgency, straining: felt to be more functional then IBD related; fiber supplements stopped. 12/2013 doing well on 200 azathiaprine daily. 2. Dysphasia: EGD August, 2011 found Schatzki's ring that was dilated to 20 mm, small hiatal hernia, tortuous esophagus. Swallowing much improved. March 2012 PPI only every other day. Repeat EGD 03/2013 for dysphagia showed 4cn HH, tortuous distal esophagus, Schatzki's ring that was dilated to 247m  HPI: This is a  very pleasant [age]ear old woman whom I last saw several months ago   Chief complaint is rectal bleeding, hemorrhoid  Had 10/07/14 knee surgery, constipation afterwards, became impacted.  Bothered by hemorrhoid, took suppository.  This improved.  A week ago, bright red blood and has had the same for several days.   Constipation is completely gone and her bowels have been fine.  Still has intermittent blood on TP and has to wear a pad.   Past Medical History  Diagnosis Date  . Crohn's disease   . History of hiatal hernia   . Osteoarthritis of knee, unilateral 09/2014    right   . Dental crowns  present   . Seasonal allergies   . High cholesterol   . Patellofemoral arthritis of right knee 10/07/2014    Past Surgical History  Procedure Laterality Date  . Tonsillectomy and adenoidectomy    . Cholecystectomy    . Appendectomy    . Wisdom tooth extraction    . Colonoscopy    . Cesarean section      x 3  . Esophagogastroduodenoscopy (egd) with esophageal dilation      x 2  . Patella-femoral arthroplasty Right 10/07/2014    Procedure: RIGHT PATELLA-FEMORAL ARTHROPLASTY;  Surgeon: JoMarchia BondMD;  [facility]: Orthopedics;  Laterality: Right;    Current Outpatient Prescriptions  Medication Sig Dispense Refill  . AZASAN 100 MG tablet TAKE 2 TABLETS BY MOUTH ONCE DAILY 60 tablet 1  . baclofen (LIORESAL) 10 MG tablet Take 1 tablet (10 mg total) by mouth 3 (three) times daily. As needed for muscle spasm 50 tablet 0  . CALCIUM CITRATE PO Take 1 tablet by mouth 2 (two) times daily.    . Cholecalciferol (VITAMIN D-3 PO) Take 1 capsule by mouth at bedtime.    . Cyanocobalamin (VITAMIN B-12 PO) Take 1 tablet by mouth daily.    . Marland KitchenYMISTA 137-50 MCG/ACT SUSP Place 1 spray into both nostrils 2 (two) times daily.  6  . fexofenadine (ALLEGRA) 180 MG tablet Take 180 mg by mouth daily.    . Marland Kitchenevocetirizine (XYZAL) 5 MG tablet Take 5 mg by mouth every evening.    . montelukast (SINGULAIR) 10 MG tablet Take 10 mg by mouth at bedtime.    . Multiple Vitamin (MULITIVITAMIN WITH MINERALS) TABS Take 1 tablet by  mouth 2 (two) times daily.    . Omega-3 Fatty Acids (FISH OIL PO) Take 2 capsules by mouth 2 (two) times daily.     . pravastatin (PRAVACHOL) 20 MG tablet Take 1 tablet (20 mg total) by mouth daily. 90 tablet 3  . Short Ragweed Pollen Ext (RAGWITEK) 12 AMB A 1-U SUBL Place 1 tablet under the tongue daily.     No current facility-administered medications for this visit.    Allergies as of 12/02/2014  . (No Known Allergies)    Family History  Problem  Relation Age of Onset  . Heart disease Father     Social History   Social History  . Marital Status: Married    Spouse Name: N/A  . Number of Children: 3  . Years of Education: N/A   Occupational History  . Retired Pharmacist, hospital    Social History Main Topics  . Smoking status: Never Smoker   . Smokeless tobacco: Never Used  . Alcohol Use: Yes     Comment: weekends  . Drug Use: No  . Sexual Activity: Not on file   Other Topics Concern  . Not on file   Social History Narrative     Physical Exam: BP 106/74 mmHg  Pulse 96  Ht 5' 7"  (1.702 m)  Wt 227 lb 6 oz (103.137 kg)  BMI 35.60 kg/m2 Constitutional: generally well-appearing Psychiatric: alert and oriented x3 Abdomen: soft, nontender, nondistended, no obvious ascites, no peritoneal signs, normal bowel sounds Rectal examination with female assistant in the room: Medium to large somewhat thrombosed, nonpainful external anal hemorrhoid, this measures 1-1/2-2 cm. There is a small scab, ulceration at one end. No internal anal hemorrhoids, no internal distal rectal masses.  Assessment and plan: 59 y.o. female with external anal hemorrhoid, bleeding  She is going to bulk her stools with fiber supplements. She will start sitz baths twice daily, called in a prescription hemorrhoid medicine with pain medicine and steroid-dependent. She will apply this twice daily after sitz baths. She will call to update on her progress in 1-2 weeks. She will have a CBC today to check to see if she has bled enough to become anemic as well.   Owens Loffler, MD [facility] 12/02/2014, 10:05 AM

## 2014-12-26 ENCOUNTER — Ambulatory Visit: Payer: BLUE CROSS/BLUE SHIELD | Admitting: Gastroenterology

## 2014-12-26 ENCOUNTER — Telehealth: Payer: Self-pay | Admitting: Gastroenterology

## 2014-12-26 ENCOUNTER — Other Ambulatory Visit: Payer: Self-pay | Admitting: Gastroenterology

## 2014-12-26 NOTE — Telephone Encounter (Signed)
Dr. Ardis Hughs please advise when pt needs to be seen again. States she is doing better but the hemorrhoid has not gone away.

## 2014-12-27 ENCOUNTER — Telehealth: Payer: Self-pay | Admitting: Gastroenterology

## 2014-12-27 NOTE — Telephone Encounter (Signed)
02/07/15 3:30 pm appt with Dr Ardis Hughs pt is aware

## 2014-12-27 NOTE — Telephone Encounter (Signed)
Offer my next available ROV.  If she is still having problems with the external hemorrhoid at that point, would likely refer to surgery

## 2014-12-27 NOTE — Telephone Encounter (Signed)
Dr Ardis Hughs are there any other meds the pt can take?

## 2014-12-28 NOTE — Telephone Encounter (Signed)
It is very importannt that she NOT miss any of this medicine.  Offer that we write another prescription for it to another pharmacy.  It is also called azathiaprine (would be same dose).

## 2014-12-29 MED ORDER — AZATHIOPRINE 50 MG PO TABS: 200.0000 mg | ORAL_TABLET | Freq: Every day | ORAL | Status: DC

## 2014-12-29 NOTE — Telephone Encounter (Signed)
Pt aware the brand Azasan is not available but the generic azathioprine 50 mg 4 tabs daily is and will be sent to the pharmacy, pt aware she can pick that up today.

## 2015-01-17 ENCOUNTER — Other Ambulatory Visit: Payer: Self-pay | Admitting: Internal Medicine

## 2015-02-07 ENCOUNTER — Ambulatory Visit: Payer: BLUE CROSS/BLUE SHIELD | Admitting: Gastroenterology

## 2015-02-13 ENCOUNTER — Ambulatory Visit (INDEPENDENT_AMBULATORY_CARE_PROVIDER_SITE_OTHER): Payer: BLUE CROSS/BLUE SHIELD | Admitting: Family Medicine

## 2015-02-13 ENCOUNTER — Encounter: Payer: Self-pay | Admitting: Family Medicine

## 2015-02-13 VITALS — BP 110/70 | HR 60 | Ht 67.0 in | Wt 239.8 lb

## 2015-02-13 DIAGNOSIS — J302 Other seasonal allergic rhinitis: Secondary | ICD-10-CM | POA: Diagnosis not present

## 2015-02-13 DIAGNOSIS — Z0000 Encounter for general adult medical examination without abnormal findings: Secondary | ICD-10-CM

## 2015-02-13 DIAGNOSIS — E661 Drug-induced obesity: Secondary | ICD-10-CM

## 2015-02-13 DIAGNOSIS — Z23 Encounter for immunization: Secondary | ICD-10-CM | POA: Diagnosis not present

## 2015-02-13 DIAGNOSIS — E785 Hyperlipidemia, unspecified: Secondary | ICD-10-CM

## 2015-02-13 LAB — POCT URINALYSIS DIPSTICK
Bilirubin, UA: NEGATIVE
Blood, UA: NEGATIVE
GLUCOSE UA: NEGATIVE
Ketones, UA: NEGATIVE
Leukocytes, UA: NEGATIVE
NITRITE UA: NEGATIVE
Protein, UA: NEGATIVE
Spec Grav, UA: 1.015
UROBILINOGEN UA: NEGATIVE
pH, UA: 6.5

## 2015-02-13 MED ORDER — PRAVASTATIN SODIUM 20 MG PO TABS: 20.0000 mg | ORAL_TABLET | Freq: Every day | ORAL | Status: DC

## 2015-02-13 NOTE — Progress Notes (Addendum)
Subjective:    Patient ID: Connie Porter, female    DOB: 1956-03-30, 59 y.o.   MRN: [id number]  HPI She is new to the practice and here to establish primary care and for a complete physical exam.  Complains of right calf tightness for several weeks. Denies redness, swelling. Has been trying to walk more.  She lost 48 lbs but states she gained 10 lbs back since knee replacement. She wants to go back to low carb, high protein. Is walking daily 1 mile. Has plan from PT for [employer]. Has fitbit.  Dr. Ardis Hughs- Crohns- sees him regularly, [facility] 3 [facility]. Saw him in March 2016 for pap and pelvic. Dr. Mardelle Matte - knee replacement at end of June Dr. Donneta Romberg- allergies, fall- ragweed, Ragwitek sublingual.   Started cholesterol medication Pravastatin 27m 1 year ago- no problems with this, needs refill.   Mammogram in March 2016 and normal.  Colonoscopy- UTD, sees Dr. JArdis Hughsevery 3 months. Next appt in December.  Eyes: overdue- April. Wears glasses for distance.  [facility]  Immunizations: flu shot wants it today.  Tdap: needs Shingles: needs Pneumonia: had pneumococcal vaccine Hep C: thinks she has this. DEXA: UTD  Reviewed allergies, medications, past medical, surgical, family and social history.   Review of Systems  Constitutional: -fever, -chills, -sweats, -unexpected weight change,-fatigue ENT: -runny nose, -ear pain, -sore throat Cardiology:  -chest pain, -palpitations, -edema Respiratory: -cough, -shortness of breath, -wheezing Gastroenterology: -abdominal pain, -nausea, -vomiting, -diarrhea, -constipation  Hematology: -bleeding or bruising problems Musculoskeletal: -arthralgias, -myalgias, -joint swelling, -back pain Ophthalmology: -vision changes Urology: -dysuria, -difficulty urinating, -hematuria, -urinary frequency, -urgency Neurology: -headache, -weakness, -tingling, -numbness       Objective:   Physical  Exam  BP 110/70 mmHg  Pulse 60  Ht 5' 7"  (1.702 m)  Wt 239 lb 12.8 oz (108.773 kg)  BMI 37.55 kg/m2  General Appearance:    Alert, cooperative, no distress, appears stated age  Head:    Normocephalic, without obvious abnormality, atraumatic  Eyes:    PERRL, conjunctiva/corneas clear, EOM's intact, fundi    benign  Ears:    Normal TM's and external ear canals  Nose:   Nares normal, mucosa normal, no drainage or sinus   tenderness  Throat:   Lips, mucosa, and tongue normal; teeth and gums normal  Neck:   Supple, no lymphadenopathy;  thyroid:  no   enlargement/tenderness/nodules; no carotid   bruit or JVD  Back:    Spine nontender, no curvature, ROM normal, no CVA     tenderness  Lungs:     Clear to auscultation bilaterally without wheezes, rales or     ronchi; respirations unlabored  Chest Wall:    No tenderness or deformity   Heart:    Regular rate and rhythm, S1 and S2 normal, no murmur, rub   or gallop  Breast Exam:    Deferred, sees GYN for this  Abdomen:     Soft, non-tender, nondistended, normoactive bowel sounds,    no masses, no hepatosplenomegaly  Genitalia:    Deferred, sees GYN  Rectal:    Deferred, GYN   Extremities:   No clubbing, cyanosis or edema, well healed scar to right anterior knee  Pulses:   2+ and symmetric all extremities  Skin:   Skin color, texture, turgor normal, no rashes or lesions  Lymph nodes:   Cervical, supraclavicular, and axillary nodes normal  Neurologic:   CNII-XII intact, normal strength, sensation and  gait; reflexes 2+ and symmetric throughout          Psych:   Normal mood, affect, hygiene and grooming.    UA dipstick: negative    Assessment & Plan:  Routine general medical examination at a health care facility - Plan: POCT urinalysis dipstick, TSH  Need for Tdap vaccination - Plan: Tdap vaccine greater than or equal to 7yo IM  Need for prophylactic vaccination and inoculation against influenza - Plan: Flu Vaccine QUAD 36+ mos IM  Morbid  obesity, unspecified obesity type ([place])  Hyperlipidemia - Plan: Lipid panel  Seasonal allergies  Recommend and encouraged her to watch calorie intake and to start being more physically active. Discussed stretching to calf to increase flexibility, suspect this is due to deconditioned muscle. She will continue seeing Dr. Ardis Hughs for Crohns, this is well controlled.  Seasonal allergies are well controlled on current regimen, no changes and she will continue to see Dr Donneta Romberg.  Discussed that she should continue statin for hyperlipidemia, will check lipids when she returns fasting.  Flu shot given, Tdap given She will return for fasting lipid panel and TSH. She will also need shingles vaccination at that time.

## 2015-02-13 NOTE — Patient Instructions (Signed)
Return for fasting labs and shingles vaccination   Preventative Care for Adults - Female      MAINTAIN REGULAR HEALTH EXAMS:  A routine yearly physical is a good way to check in with your primary care provider about your health and preventive screening. It is also an opportunity to share updates about your health and any concerns you have, and receive a thorough all-over exam.   Most health insurance companies pay for at least some preventative services.  Check with your health plan for specific coverages.  WHAT PREVENTATIVE SERVICES DO WOMEN NEED?  Adult women should have their weight and blood pressure checked regularly.   Women age 31 and older should have their cholesterol levels checked regularly.  Women should be screened for cervical cancer with a Pap smear and pelvic exam beginning at either age 64, or 3 years after they become sexually activity.    Breast cancer screening generally begins at age 50 with a mammogram and breast exam by your primary care provider.    Beginning at age 24 and continuing to age 16, women should be screened for colorectal cancer.  Certain people may need continued testing until age 90.  Updating vaccinations is part of preventative care.  Vaccinations help protect against diseases such as the flu.  Osteoporosis is a disease in which the bones lose minerals and strength as we age. Women ages 47 and over should discuss this with their caregivers, as should women after menopause who have other risk factors.  Lab tests are generally done as part of preventative care to screen for anemia and blood disorders, to screen for problems with the kidneys and liver, to screen for bladder problems, to check blood sugar, and to check your cholesterol level.  Preventative services generally include counseling about diet, exercise, avoiding tobacco, drugs, excessive alcohol consumption, and sexually transmitted infections.    GENERAL RECOMMENDATIONS FOR GOOD  HEALTH:  Healthy diet:  Eat a variety of foods, including fruit, vegetables, animal or vegetable protein, such as meat, fish, chicken, and eggs, or beans, lentils, tofu, and grains, such as rice.  Drink plenty of water daily.  Decrease saturated fat in the diet, avoid lots of red meat, processed foods, sweets, fast foods, and fried foods.  Exercise:  Aerobic exercise helps maintain good heart health. At least 30-40 minutes of moderate-intensity exercise is recommended. For example, a brisk walk that increases your heart rate and breathing. This should be done on most days of the week.   Find a type of exercise or a variety of exercises that you enjoy so that it becomes a part of your daily life.  Examples are running, walking, swimming, water aerobics, and biking.  For motivation and support, explore group exercise such as aerobic class, spin class, Zumba, Yoga,or  martial arts, etc.    Set exercise goals for yourself, such as a certain weight goal, walk or run in a race such as a 5k walk/run.  Speak to your primary care provider about exercise goals.  Disease prevention:  If you smoke or chew tobacco, find out from your caregiver how to quit. It can literally save your life, no matter how long you have been a tobacco user. If you do not use tobacco, never begin.   Maintain a healthy diet and normal weight. Increased weight leads to problems with blood pressure and diabetes.   The Body Mass Index or BMI is a way of measuring how much of your body is fat. Having a  BMI above 27 increases the risk of heart disease, diabetes, hypertension, stroke and other problems related to obesity. Your caregiver can help determine your BMI and based on it develop an exercise and dietary program to help you achieve or maintain this important measurement at a healthful level.  High blood pressure causes heart and blood vessel problems.  Persistent high blood pressure should be treated with medicine if weight  loss and exercise do not work.   Fat and cholesterol leaves deposits in your arteries that can block them. This causes heart disease and vessel disease elsewhere in your body.  If your cholesterol is found to be high, or if you have heart disease or certain other medical conditions, then you may need to have your cholesterol monitored frequently and be treated with medication.   Ask if you should have a cardiac stress test if your history suggests this. A stress test is a test done on a treadmill that looks for heart disease. This test can find disease prior to there being a problem.  Menopause can be associated with physical symptoms and risks. Hormone replacement therapy is available to decrease these. You should talk to your caregiver about whether starting or continuing to take hormones is right for you.   Osteoporosis is a disease in which the bones lose minerals and strength as we age. This can result in serious bone fractures. Risk of osteoporosis can be identified using a bone density scan. Women ages 53 and over should discuss this with their caregivers, as should women after menopause who have other risk factors. Ask your caregiver whether you should be taking a calcium supplement and Vitamin D, to reduce the rate of osteoporosis.   Avoid drinking alcohol in excess (more than two drinks per day).  Avoid use of street drugs. Do not share needles with anyone. Ask for professional help if you need assistance or instructions on stopping the use of alcohol, cigarettes, and/or drugs.  Brush your teeth twice a day with fluoride toothpaste, and floss once a day. Good oral hygiene prevents tooth decay and gum disease. The problems can be painful, unattractive, and can cause other health problems. Visit your dentist for a routine oral and dental check up and preventive care every 6-12 months.   Look at your skin regularly.  Use a mirror to look at your back. Notify your caregivers of changes in moles,  especially if there are changes in shapes, colors, a size larger than a pencil eraser, an irregular border, or development of new moles.  Safety:  Use seatbelts 100% of the time, whether driving or as a passenger.  Use safety devices such as hearing protection if you work in environments with loud noise or significant background noise.  Use safety glasses when doing any work that could send debris in to the eyes.  Use a helmet if you ride a bike or motorcycle.  Use appropriate safety gear for contact sports.  Talk to your caregiver about gun safety.  Use sunscreen with a SPF (or skin protection factor) of 15 or greater.  Lighter skinned people are at a greater risk of skin cancer. Don't forget to also wear sunglasses in order to protect your eyes from too much damaging sunlight. Damaging sunlight can accelerate cataract formation.   Practice safe sex. Use condoms. Condoms are used for birth control and to help reduce the spread of sexually transmitted infections (or STIs).  Some of the STIs are gonorrhea (the clap), chlamydia, syphilis, trichomonas, herpes,  HPV (human papilloma virus) and HIV (human immunodeficiency virus) which causes AIDS. The herpes, HIV and HPV are viral illnesses that have no cure. These can result in disability, cancer and death.   Keep carbon monoxide and smoke detectors in your home functioning at all times. Change the batteries every 6 months or use a model that plugs into the wall.   Vaccinations:  Stay up to date with your tetanus shots and other required immunizations. You should have a booster for tetanus every 10 years. Be sure to get your flu shot every year, since 5%-20% of the [place] population comes down with the flu. The flu vaccine changes each year, so being vaccinated once is not enough. Get your shot in the fall, before the flu season peaks.   Other vaccines to consider:  Human Papilloma Virus or HPV causes cancer of the cervix, and other infections that can be  transmitted from person to person. There is a vaccine for HPV, and females should get immunized between the ages of 84 and 53. It requires a series of 3 shots.   Pneumococcal vaccine to protect against certain types of pneumonia.  This is normally recommended for adults age 49 or older.  However, adults younger than 60 years old with certain underlying conditions such as diabetes, heart or lung disease should also receive the vaccine.  Shingles vaccine to protect against Varicella Zoster if you are older than age 58, or younger than 60 years old with certain underlying illness.  Hepatitis A vaccine to protect against a form of infection of the liver by a virus acquired from food.  Hepatitis B vaccine to protect against a form of infection of the liver by a virus acquired from blood or body fluids, particularly if you work in health care.  If you plan to travel internationally, check with your local health department for specific vaccination recommendations.  Cancer Screening:  Breast cancer screening is essential to preventive care for women. All women age 12 and older should perform a breast self-exam every month. At age 34 and older, women should have their caregiver complete a breast exam each year. Women at ages 37 and older should have a mammogram (x-ray film) of the breasts. Your caregiver can discuss how often you need mammograms.    Cervical cancer screening includes taking a Pap smear (sample of cells examined under a microscope) from the cervix (end of the uterus). It also includes testing for HPV (Human Papilloma Virus, which can cause cervical cancer). Screening and a pelvic exam should begin at age 65, or 3 years after a woman becomes sexually active. Screening should occur every year, with a Pap smear but no HPV testing, up to age 56. After age 8, you should have a Pap smear every 3 years with HPV testing, if no HPV was found previously.   Most routine colon cancer screening begins  at the age of 77. On a yearly basis, doctors may provide special easy to use take-home tests to check for hidden blood in the stool. Sigmoidoscopy or colonoscopy can detect the earliest forms of colon cancer and is life saving. These tests use a small camera at the end of a tube to directly examine the colon. Speak to your caregiver about this at age 29, when routine screening begins (and is repeated every 5 years unless early forms of pre-cancerous polyps or small growths are found).

## 2015-02-14 ENCOUNTER — Encounter: Payer: Self-pay | Admitting: Family Medicine

## 2015-02-21 ENCOUNTER — Other Ambulatory Visit (INDEPENDENT_AMBULATORY_CARE_PROVIDER_SITE_OTHER): Payer: BLUE CROSS/BLUE SHIELD

## 2015-02-21 DIAGNOSIS — E785 Hyperlipidemia, unspecified: Secondary | ICD-10-CM

## 2015-02-21 DIAGNOSIS — Z23 Encounter for immunization: Secondary | ICD-10-CM

## 2015-02-21 DIAGNOSIS — Z0000 Encounter for general adult medical examination without abnormal findings: Secondary | ICD-10-CM

## 2015-02-21 LAB — LIPID PANEL
Cholesterol: 209 mg/dL — ABNORMAL HIGH (ref 125–200)
HDL: 84 mg/dL (ref >=46)
LDL Cholesterol: 107 mg/dL (ref <130)
Total CHOL/HDL Ratio: 2.5 Ratio (ref <=5.0)
Triglycerides: 89 mg/dL (ref <150)
VLDL: 18 mg/dL (ref <30)

## 2015-02-21 LAB — TSH: TSH: 2.462 u[IU]/mL (ref 0.350–4.500)

## 2015-04-03 ENCOUNTER — Ambulatory Visit: Payer: BLUE CROSS/BLUE SHIELD | Admitting: Gastroenterology

## 2015-04-07 ENCOUNTER — Other Ambulatory Visit (INDEPENDENT_AMBULATORY_CARE_PROVIDER_SITE_OTHER): Payer: BLUE CROSS/BLUE SHIELD

## 2015-04-07 ENCOUNTER — Encounter: Payer: Self-pay | Admitting: Gastroenterology

## 2015-04-07 ENCOUNTER — Ambulatory Visit (INDEPENDENT_AMBULATORY_CARE_PROVIDER_SITE_OTHER): Payer: BLUE CROSS/BLUE SHIELD | Admitting: Gastroenterology

## 2015-04-07 VITALS — BP 110/60 | HR 72 | Ht 67.0 in | Wt 244.0 lb

## 2015-04-07 DIAGNOSIS — K50119 Crohn's disease of large intestine with unspecified complications: Secondary | ICD-10-CM

## 2015-04-07 LAB — COMPREHENSIVE METABOLIC PANEL
ALBUMIN: 4 g/dL (ref 3.5–5.2)
ALT: 10 U/L (ref 0–35)
AST: 18 U/L (ref 0–37)
Alkaline Phosphatase: 42 U/L (ref 39–117)
BUN: 21 mg/dL (ref 6–23)
CALCIUM: 9.3 mg/dL (ref 8.4–10.5)
CO2: 28 mEq/L (ref 19–32)
CREATININE: 0.89 mg/dL (ref 0.40–1.20)
Chloride: 107 mEq/L (ref 96–112)
GFR: 68.65 mL/min (ref >60.00)
Glucose, Bld: 65 mg/dL — ABNORMAL LOW (ref 70–99)
Potassium: 4.3 mEq/L (ref 3.5–5.1)
Sodium: 141 mEq/L (ref 135–145)
Total Bilirubin: 0.4 mg/dL (ref 0.2–1.2)
Total Protein: 7 g/dL (ref 6.0–8.3)

## 2015-04-07 LAB — SEDIMENTATION RATE: SED RATE: 32 mm/h — AB (ref 0–22)

## 2015-04-07 LAB — CBC WITH DIFFERENTIAL/PLATELET
BASOS ABS: 0 10*3/uL (ref 0.0–0.1)
BASOS PCT: 0.6 % (ref 0.0–3.0)
Eosinophils Absolute: 0.1 10*3/uL (ref 0.0–0.7)
Eosinophils Relative: 1.6 % (ref 0.0–5.0)
HEMATOCRIT: 36.1 % (ref 36.0–46.0)
HEMOGLOBIN: 12.2 g/dL (ref 12.0–15.0)
LYMPHS PCT: 7.9 % — AB (ref 12.0–46.0)
Lymphs Abs: 0.4 10*3/uL — ABNORMAL LOW (ref 0.7–4.0)
MCHC: 33.7 g/dL (ref 30.0–36.0)
MCV: 98.9 fl (ref 78.0–100.0)
Monocytes Absolute: 0.3 10*3/uL (ref 0.1–1.0)
Monocytes Relative: 5.7 % (ref 3.0–12.0)
Neutro Abs: 4.1 10*3/uL (ref 1.4–7.7)
Neutrophils Relative %: 84.2 % — ABNORMAL HIGH (ref 43.0–77.0)
Platelets: 284 10*3/uL (ref 150.0–400.0)
RBC: 3.65 Mil/uL — ABNORMAL LOW (ref 3.87–5.11)
RDW: 14.3 % (ref 11.5–15.5)
WBC: 4.9 10*3/uL (ref 4.0–10.5)

## 2015-04-07 LAB — HIGH SENSITIVITY CRP: CRP HIGH SENSITIVITY: 1.04 mg/L (ref 0.000–5.000)

## 2015-04-07 NOTE — Progress Notes (Signed)
Review of gastrointestinal problems:  1.Crohn's ileocolitis: Workup at [facility].Marland KitchenMarland KitchenPromethius IBD Serology 7 testing 02/2008 "pattern consistent with IBD.Marland KitchenMarland KitchenCrohn's." Colonoscopy 01/2008 showed terminal ileum and right colon ulcerations, pathology c/w acute and chronic ileitis, colitis, also HP polyps. Colonoscopy Path report from 2006 showed HP polyps. Office note 05/2009 documented previous C. diff colitis (treated empirically and also after toxin +). 2012 doing well on oral mesalamine 3.6 gms/day. ? Worsening of disease 03/2013, c. Diff PCR neg; increased mesalamine to 4.8gm daily. Colonoscopy 04/2012 found essentially normal colon but moderate to severe ileitis (path confirmed acute and chronic inflammation), started on 269m azathiaprine daily Had erythema nodosum 2014, early, improved with steroids. 11/2012 doing very well on 200 azathiaprine daily. 04/2013: urgency, straining: felt to be more functional then IBD related; fiber supplements stopped. 12/2013 doing well on 200 azathiaprine daily. 2. Dysphasia: EGD August, 2011 found Schatzki's ring that was dilated to 20 mm, small hiatal hernia, tortuous esophagus. Swallowing much improved. March 2012 PPI only every other day. Repeat EGD 03/2013 for dysphagia showed 4cn HH, tortuous distal esophagus, Schatzki's ring that was dilated to 265m 3. Thrombosed external hemorrhoid 2016, this was around the time of constipation related to pain medicine after a knee surgery. Resolve with conservative therapy   HPI: This is a   very pleasant 59 year old woman whom I last saw for 5 months ago.  Chief complaint is Crohn's ileocolitis  Her bowels move fine.  Has BM 2-3 times in AM.  Soft, but only sometimes formed.  No abd pains.  No fever, chills.  No rashes, sore joints, eye issues.  She takes 20061mzathiaprine once daily.   Past Medical History  Diagnosis Date  . Cro[place]'s disease ([place] . History of hiatal hernia   . Osteoarthritis of knee, unilateral  09/2014    right   . Dental crowns present   . Seasonal allergies   . High cholesterol   . Patellofemoral arthritis of right knee 10/07/2014    Past Surgical History  Procedure Laterality Date  . Tonsillectomy and adenoidectomy    . Cholecystectomy    . Appendectomy    . Wisdom tooth extraction    . Colonoscopy    . Cesarean section      x 3  . Esophagogastroduodenoscopy (egd) with esophageal dilation      x 2  . Patella-femoral arthroplasty Right 10/07/2014    Procedure: RIGHT PATELLA-FEMORAL ARTHROPLASTY;  Surgeon: JosMarchia BondD;  [facility];  Laterality: Right;  . Joint replacement      partial knee replacement    Current Outpatient Prescriptions  Medication Sig Dispense Refill  . aspirin 81 MG tablet Take 81 mg by mouth daily.    . aMarland KitchenaTHIOprine (IMURAN) 50 MG tablet Take 4 tablets (200 mg total) by mouth daily. 360 tablet 3  . CALCIUM CITRATE PO Take 1 tablet by mouth 2 (two) times daily.    . Cholecalciferol (VITAMIN D-3 PO) Take 1 capsule by mouth at bedtime.    . Cyanocobalamin (VITAMIN B-12 PO) Take 1 tablet by mouth daily.    . fexofenadine (ALLEGRA) 180 MG tablet Take 180 mg by mouth daily.    . lMarland Kitchenvocetirizine (XYZAL) 5 MG tablet Take 5 mg by mouth every evening.    . montelukast (SINGULAIR) 10 MG tablet Take 10 mg by mouth at bedtime.    . Multiple Vitamin (MULITIVITAMIN WITH MINERALS) TABS Take 1 tablet by mouth 2 (two) times daily.    .Marland Kitchen  Omega-3 Fatty Acids (FISH OIL PO) Take 2 capsules by mouth 2 (two) times daily.     . pravastatin (PRAVACHOL) 20 MG tablet Take 1 tablet (20 mg total) by mouth daily. 90 tablet 3  . Short Ragweed Pollen Ext (RAGWITEK) 12 AMB A 1-U SUBL Place 1 tablet under the tongue daily.     No current facility-administered medications for this visit.    Allergies as of 04/07/2015  . (No Known Allergies)    Family History  Problem Relation Age of Onset  . Heart disease Father   .  Hyperlipidemia Father   . Heart disease Brother 24    MI  . Hyperlipidemia Mother   . Arthritis Mother   . Asthma Son   . Stroke Maternal Grandfather     Social History   Social History  . Marital Status: Married    Spouse Name: N/A  . Number of Children: 3  . Years of Education: N/A   Occupational History  . Retired Pharmacist, hospital    Social History Main Topics  . Smoking status: Never Smoker   . Smokeless tobacco: Never Used  . Alcohol Use: Yes     Comment: weekends  . Drug Use: No  . Sexual Activity: Yes    Birth Control/ Protection: Post-menopausal   Other Topics Concern  . Not on file   Social History Narrative     Physical Exam: BP 110/60 mmHg  Pulse 72  Ht 5' 7"  (1.702 m)  Wt 244 lb (110.678 kg)  BMI 38.21 kg/m2 Constitutional: generally well-appearing Psychiatric: alert and oriented x3 Abdomen: soft, nontender, nondistended, no obvious ascites, no peritoneal signs, normal bowel sounds   Assessment and plan: 59 y.o. female with Crohn's ileocolitis  She doesn't usually have formed stools and although she is not really bothered by her bowel patterns I'm concerned that possibly she still has ongoing inflammation despite azathioprine at 200 mg daily. She otherwise feels absolutely fine. Going to get a basic set of labs today including CBC and complete metabolic profile and also inflammatory markers. If the inflammatory markers are elevated I have to assume that she may indeed have active inflammation still. At that point I would either recommend repeating colonoscopy or imaging such as MR enterography to get an idea if truly she has ongoing inflammation. I would dose adjust her azathioprine or perhaps increase her to Biologics if that were the case. She will either way return to see me in 6 months and sooner if needed.   Connie Loffler, MD [facility] 04/07/2015, 9:16 AM

## 2015-04-07 NOTE — Patient Instructions (Signed)
You will have labs checked today in the basement lab.  Please head down after you check out with the front desk  (cbc, cmet, esr, crp). For now stay on azathiaprine 200mg once daily. Please return to see Dr. Jacobs in 6 months. 

## 2015-04-12 ENCOUNTER — Other Ambulatory Visit: Payer: Self-pay

## 2015-04-12 DIAGNOSIS — K50119 Crohn's disease of large intestine with unspecified complications: Secondary | ICD-10-CM

## 2015-05-01 ENCOUNTER — Ambulatory Visit (HOSPITAL_COMMUNITY)
Admission: RE | Admit: 2015-05-01 | Discharge: 2015-05-01 | Disposition: A | Discharge status: Home / Self Care | Payer: BLUE CROSS/BLUE SHIELD | Source: Ambulatory Visit | Attending: Gastroenterology | Admitting: Gastroenterology

## 2015-05-01 ENCOUNTER — Ambulatory Visit (HOSPITAL_COMMUNITY): Admission: RE | Admit: 2015-05-01 | Payer: BLUE CROSS/BLUE SHIELD | Source: Ambulatory Visit

## 2015-05-01 DIAGNOSIS — K5090 Crohn's disease, unspecified, without complications: Secondary | ICD-10-CM | POA: Diagnosis not present

## 2015-05-01 DIAGNOSIS — K50119 Crohn's disease of large intestine with unspecified complications: Secondary | ICD-10-CM

## 2015-05-01 LAB — POCT I-STAT, CHEM 8
BUN: 21 mg/dL — AB (ref 6–20)
CHLORIDE: 104 mmol/L (ref 101–111)
Calcium, Ion: 1.17 mmol/L (ref 1.13–1.30)
Creatinine, Ser: 0.9 mg/dL (ref 0.44–1.00)
Glucose, Bld: 79 mg/dL (ref 65–99)
HEMATOCRIT: 36 % (ref 36.0–46.0)
Hemoglobin: 12.2 g/dL (ref 12.0–15.0)
POTASSIUM: 3.7 mmol/L (ref 3.5–5.1)
SODIUM: 138 mmol/L (ref 135–145)
TCO2: 28 mmol/L (ref 0–100)

## 2015-05-01 MED ORDER — GADOBENATE DIMEGLUMINE 529 MG/ML IV SOLN
20.0000 mL | Freq: Once | INTRAVENOUS | Status: AC | PRN
  Administered 2015-05-01: 20 mL via INTRAVENOUS

## 2015-05-04 ENCOUNTER — Other Ambulatory Visit: Payer: Self-pay

## 2015-05-04 DIAGNOSIS — K50119 Crohn's disease of large intestine with unspecified complications: Secondary | ICD-10-CM

## 2015-05-04 MED ORDER — AZATHIOPRINE 50 MG PO TABS: 250.0000 mg | ORAL_TABLET | Freq: Every day | ORAL | Status: DC

## 2015-05-18 ENCOUNTER — Telehealth: Payer: Self-pay

## 2015-05-18 NOTE — Telephone Encounter (Signed)
-----   Message from Barron Alvine, [place] sent at 05/04/2015  2:14 PM EST ----- Pt to get labs

## 2015-05-18 NOTE — Telephone Encounter (Signed)
Letter sent to the pt reminding her of labs via My Chart

## 2015-05-26 ENCOUNTER — Other Ambulatory Visit (INDEPENDENT_AMBULATORY_CARE_PROVIDER_SITE_OTHER): Payer: BLUE CROSS/BLUE SHIELD

## 2015-05-26 DIAGNOSIS — K50119 Crohn's disease of large intestine with unspecified complications: Secondary | ICD-10-CM | POA: Diagnosis not present

## 2015-05-26 LAB — COMPREHENSIVE METABOLIC PANEL
ALT: 13 U/L (ref 0–35)
AST: 19 U/L (ref 0–37)
Albumin: 4.2 g/dL (ref 3.5–5.2)
Alkaline Phosphatase: 42 U/L (ref 39–117)
BILIRUBIN TOTAL: 0.6 mg/dL (ref 0.2–1.2)
BUN: 14 mg/dL (ref 6–23)
CHLORIDE: 105 meq/L (ref 96–112)
CO2: 30 mEq/L (ref 19–32)
Calcium: 9.9 mg/dL (ref 8.4–10.5)
Creatinine, Ser: 0.9 mg/dL (ref 0.40–1.20)
GFR: 67.74 mL/min (ref >60.00)
Glucose, Bld: 95 mg/dL (ref 70–99)
Potassium: 4.4 mEq/L (ref 3.5–5.1)
SODIUM: 141 meq/L (ref 135–145)
Total Protein: 7.5 g/dL (ref 6.0–8.3)

## 2015-05-28 LAB — CBC WITH DIFFERENTIAL/PLATELET
BASOS PCT: 0.6 % (ref 0.0–3.0)
Basophils Absolute: 0 10*3/uL (ref 0.0–0.1)
EOS PCT: 1.6 % (ref 0.0–5.0)
Eosinophils Absolute: 0.1 10*3/uL (ref 0.0–0.7)
HCT: 40.1 % (ref 36.0–46.0)
Hemoglobin: 12.1 g/dL (ref 12.0–15.0)
Lymphocytes Relative: 15.4 % (ref 12.0–46.0)
Lymphs Abs: 0.5 10*3/uL — ABNORMAL LOW (ref 0.7–4.0)
MCHC: 30.2 g/dL (ref 30.0–36.0)
MCV: 110.5 fl — ABNORMAL HIGH (ref 78.0–100.0)
Monocytes Absolute: 0.2 10*3/uL (ref 0.1–1.0)
Monocytes Relative: 6.9 % (ref 3.0–12.0)
Neutro Abs: 2.4 10*3/uL (ref 1.4–7.7)
Neutrophils Relative %: 75.5 % (ref 43.0–77.0)
PLATELETS: 408 10*3/uL — AB (ref 150.0–400.0)
RBC: 3.63 Mil/uL — ABNORMAL LOW (ref 3.87–5.11)
RDW: 14.9 % (ref 11.5–15.5)
WBC: 3.2 10*3/uL — ABNORMAL LOW (ref 4.0–10.5)

## 2015-05-31 ENCOUNTER — Other Ambulatory Visit: Payer: Self-pay

## 2015-05-31 DIAGNOSIS — K50119 Crohn's disease of large intestine with unspecified complications: Secondary | ICD-10-CM

## 2015-07-12 ENCOUNTER — Telehealth: Payer: Self-pay

## 2015-07-12 NOTE — Telephone Encounter (Signed)
Letter sent to the pt reminding her of her labs.

## 2015-07-12 NOTE — Telephone Encounter (Signed)
-----   Message from Barron Alvine, RN sent at 05/31/2015 10:30 AM EST ----- Pt needs to have labs

## 2015-07-26 ENCOUNTER — Other Ambulatory Visit (INDEPENDENT_AMBULATORY_CARE_PROVIDER_SITE_OTHER): Payer: BLUE CROSS/BLUE SHIELD

## 2015-07-26 DIAGNOSIS — K50119 Crohn's disease of large intestine with unspecified complications: Secondary | ICD-10-CM

## 2015-07-26 LAB — COMPREHENSIVE METABOLIC PANEL
ALT: 53 U/L — ABNORMAL HIGH (ref 0–35)
AST: 47 U/L — ABNORMAL HIGH (ref 0–37)
Albumin: 4.1 g/dL (ref 3.5–5.2)
Alkaline Phosphatase: 43 U/L (ref 39–117)
BUN: 21 mg/dL (ref 6–23)
CALCIUM: 9.8 mg/dL (ref 8.4–10.5)
CHLORIDE: 106 meq/L (ref 96–112)
CO2: 29 meq/L (ref 19–32)
Creatinine, Ser: 1.01 mg/dL (ref 0.40–1.20)
GFR: 59.27 mL/min — AB (ref >60.00)
Glucose, Bld: 93 mg/dL (ref 70–99)
POTASSIUM: 4.7 meq/L (ref 3.5–5.1)
Sodium: 141 mEq/L (ref 135–145)
Total Bilirubin: 0.6 mg/dL (ref 0.2–1.2)
Total Protein: 7.3 g/dL (ref 6.0–8.3)

## 2015-07-26 LAB — CBC WITH DIFFERENTIAL/PLATELET
BASOS PCT: 0.4 % (ref 0.0–3.0)
Basophils Absolute: 0 10*3/uL (ref 0.0–0.1)
Eosinophils Absolute: 0 10*3/uL (ref 0.0–0.7)
Eosinophils Relative: 1.3 % (ref 0.0–5.0)
HEMATOCRIT: 35.1 % — AB (ref 36.0–46.0)
Hemoglobin: 11.9 g/dL — ABNORMAL LOW (ref 12.0–15.0)
LYMPHS PCT: 13.4 % (ref 12.0–46.0)
Lymphs Abs: 0.5 10*3/uL — ABNORMAL LOW (ref 0.7–4.0)
MCHC: 33.9 g/dL (ref 30.0–36.0)
MCV: 97.8 fl (ref 78.0–100.0)
Monocytes Absolute: 0.4 10*3/uL (ref 0.1–1.0)
Monocytes Relative: 10 % (ref 3.0–12.0)
NEUTROS PCT: 74.9 % (ref 43.0–77.0)
Neutro Abs: 2.7 10*3/uL (ref 1.4–7.7)
PLATELETS: 490 10*3/uL — AB (ref 150.0–400.0)
RBC: 3.59 Mil/uL — ABNORMAL LOW (ref 3.87–5.11)
RDW: 15 % (ref 11.5–15.5)
WBC: 3.7 10*3/uL — AB (ref 4.0–10.5)

## 2015-08-01 ENCOUNTER — Other Ambulatory Visit: Payer: Self-pay

## 2015-08-01 DIAGNOSIS — R945 Abnormal results of liver function studies: Principal | ICD-10-CM

## 2015-08-01 DIAGNOSIS — R7989 Other specified abnormal findings of blood chemistry: Secondary | ICD-10-CM

## 2015-08-09 ENCOUNTER — Other Ambulatory Visit (INDEPENDENT_AMBULATORY_CARE_PROVIDER_SITE_OTHER): Payer: BLUE CROSS/BLUE SHIELD

## 2015-08-09 DIAGNOSIS — R7989 Other specified abnormal findings of blood chemistry: Secondary | ICD-10-CM

## 2015-08-09 DIAGNOSIS — R945 Abnormal results of liver function studies: Principal | ICD-10-CM

## 2015-08-09 LAB — HEPATIC FUNCTION PANEL
ALK PHOS: 38 U/L — AB (ref 39–117)
ALT: 62 U/L — ABNORMAL HIGH (ref 0–35)
AST: 52 U/L — ABNORMAL HIGH (ref 0–37)
Albumin: 4 g/dL (ref 3.5–5.2)
BILIRUBIN DIRECT: 0.2 mg/dL (ref 0.0–0.3)
BILIRUBIN TOTAL: 0.7 mg/dL (ref 0.2–1.2)
Total Protein: 7.2 g/dL (ref 6.0–8.3)

## 2015-08-11 ENCOUNTER — Encounter: Payer: Self-pay | Admitting: Gastroenterology

## 2015-08-11 ENCOUNTER — Ambulatory Visit (INDEPENDENT_AMBULATORY_CARE_PROVIDER_SITE_OTHER): Payer: BLUE CROSS/BLUE SHIELD | Admitting: Gastroenterology

## 2015-08-11 VITALS — BP 120/64 | HR 72 | Ht 67.5 in | Wt 252.8 lb

## 2015-08-11 DIAGNOSIS — K50119 Crohn's disease of large intestine with unspecified complications: Secondary | ICD-10-CM | POA: Diagnosis not present

## 2015-08-11 NOTE — Progress Notes (Signed)
Review of gastrointestinal problems:  1.Crohn's ileocolitis: Workup at [facility]Marland KitchenMarland KitchenPromethius IBD Serology 7 testing 02/2008 "pattern consistent with IBD.Marland KitchenMarland KitchenCrohn's." Colonoscopy 01/2008 showed terminal ileum and right colon ulcerations, pathology c/w acute and chronic ileitis, colitis, also HP polyps. Colonoscopy Path report from 2006 showed HP polyps. Office note 05/2009 documented previous C. diff colitis (treated empirically and also after toxin +). 2012 doing well on oral mesalamine 3.6 gms/day. ? Worsening of disease 03/2013, c. Diff PCR neg; increased mesalamine to 4.8gm daily. Colonoscopy 04/2012 found essentially normal colon but moderate to severe ileitis (path confirmed acute and chronic inflammation), started on 217m azathiaprine daily Had erythema nodosum 2014, early, improved with steroids. 11/2012 doing very well on 200 azathiaprine daily. 04/2013: urgency, straining: felt to be more functional then IBD related; fiber supplements stopped. 12/2013 doing well on 200 azathiaprine daily. 03/2015 MRI enterograophy "mild wall thickening in TI and cecum...active crohn's disease" and so azathiaprine dose increased to 2557mdaily 2. Dysphasia: EGD August, 2011 found Schatzki's ring that was dilated to 20 mm, small hiatal hernia, tortuous esophagus. Swallowing much improved. March 2012 PPI only every other day. Repeat EGD 03/2013 for dysphagia showed 4cn HH, tortuous distal esophagus, Schatzki's ring that was dilated to 2038m3. Thrombosed external hemorrhoid 2016, this was around the time of constipation related to pain medicine after a knee surgery. Resolve with conservative therapy   HPI: This is a  very pleasant 59 6ar old woman whom I last saw 5 or 6 months ago  Chief complaint is Crohn's disease  Her azathioprine dose was increased by 50 mg after noting active inflammation by MRI enterography and elevated inflammatory marker. Since then her liver tests have been gradually rising. Still not very  high  Weight u p 8 pounds since last visit 5 months ago  Bowels are inconsistant;  Sometimes constipated, sometimes several in a day.  No serious pains.  No chills, fevers, no rashes.   Past Medical History  Diagnosis Date  . Crohn's disease ([facility] . History of hiatal hernia   . Osteoarthritis of knee, unilateral 09/2014    right   . Dental crowns present   . Seasonal allergies   . High cholesterol   . Patellofemoral arthritis of right knee 10/07/2014    Past Surgical History  Procedure Laterality Date  . Tonsillectomy and adenoidectomy    . Cholecystectomy    . Appendectomy    . Wisdom tooth extraction    . Colonoscopy    . Cesarean section      x 3  . Esophagogastroduodenoscopy (egd) with esophageal dilation      x 2  . Patella-femoral arthroplasty Right 10/07/2014    Procedure: RIGHT PATELLA-FEMORAL ARTHROPLASTY;  Surgeon: JosMarchia BondD;  [facility];  Laterality: Right;    Current Outpatient Prescriptions  Medication Sig Dispense Refill  . aspirin 81 MG tablet Take 81 mg by mouth daily.    . aMarland KitchenaTHIOprine (IMURAN) 50 MG tablet Take 5 tablets (250 mg total) by mouth daily. 450 tablet 3  . CALCIUM CITRATE PO Take 1 tablet by mouth 2 (two) times daily.    . Cholecalciferol (VITAMIN D-3 PO) Take 1 capsule by mouth at bedtime.    . Cyanocobalamin (VITAMIN B-12 PO) Take 1 tablet by mouth daily.    . fexofenadine (ALLEGRA) 180 MG tablet Take 180 mg by mouth daily.    . lMarland Kitchenvocetirizine (XYZAL) 5 MG tablet Take 5 mg by mouth every evening.    .Marland Kitchen  montelukast (SINGULAIR) 10 MG tablet Take 10 mg by mouth at bedtime.    . Multiple Vitamin (MULITIVITAMIN WITH MINERALS) TABS Take 1 tablet by mouth 2 (two) times daily.    . Omega-3 Fatty Acids (FISH OIL PO) Take 2 capsules by mouth 2 (two) times daily.     . pravastatin (PRAVACHOL) 20 MG tablet Take 1 tablet (20 mg total) by mouth daily. 90 tablet 3   No current facility-administered  medications for this visit.    Allergies as of 08/11/2015  . (No Known Allergies)    Family History  Problem Relation Age of Onset  . Heart disease Father   . Hyperlipidemia Father   . Heart disease Brother 65    MI  . Hyperlipidemia Mother   . Arthritis Mother   . Asthma Son   . Stroke Maternal Grandfather   . Colon cancer Neg Hx     Social History   Social History  . Marital Status: Married    Spouse Name: N/A  . Number of Children: 3  . Years of Education: N/A   Occupational History  . Retired Pharmacist, hospital    Social History Main Topics  . Smoking status: Never Smoker   . Smokeless tobacco: Never Used  . Alcohol Use: Yes     Comment: weekends  . Drug Use: No  . Sexual Activity: Yes    Birth Control/ Protection: Post-menopausal   Other Topics Concern  . Not on file   Social History Narrative     Physical Exam: BP 120/64 mmHg  Pulse 72  Ht 5' 7.5" (1.715 m)  Wt 252 lb 12.8 oz (114.669 kg)  BMI 38.99 kg/m2 Constitutional: generally well-appearing Psychiatric: alert and oriented x3 Abdomen: soft, nontender, nondistended, no obvious ascites, no peritoneal signs, normal bowel sounds   Assessment and plan: 60 y.o. female with Crohn's ileocolitis  She is bothered by alternating bowel habits. She probably like her bowel habits bit better when she was on lower dose azathioprine. There were more consistent albeit generally loose. Now she is struggling with constipation periodically. I recommended she just the fiber intake in her diet to help adjust her bowel regularity. She's gained 8 pounds since her last visit and I'm not sure if her slight transaminase elevation is reflection of her weight gain or if it is from her slightly increased azathioprine dose. She is going to commit to losing that weight over the next 3 or 4 months and we will follow her liver tests. We will keep her azathioprine at its current dose which is 250 mg once daily. She will have repeat basic  metabolic profile in 4 weeks and she'll return to see me in 4 months.   Owens Loffler, MD [facility] 08/11/2015, 9:37 AM

## 2015-08-11 NOTE — Patient Instructions (Addendum)
You need to lose 8-10 pounds over the next 3-4 months. Please return to see Dr. Ardis Hughs in 4 months. Keep azathiaprine at 258m once daily. Labs (cmet) in 4 weeks. Try alternating your your fiber in diet, supplements to regulate your bowels

## 2015-09-08 ENCOUNTER — Telehealth: Payer: Self-pay

## 2015-09-08 ENCOUNTER — Other Ambulatory Visit (INDEPENDENT_AMBULATORY_CARE_PROVIDER_SITE_OTHER): Payer: BLUE CROSS/BLUE SHIELD

## 2015-09-08 DIAGNOSIS — K50119 Crohn's disease of large intestine with unspecified complications: Secondary | ICD-10-CM

## 2015-09-08 LAB — COMPREHENSIVE METABOLIC PANEL
ALT: 59 U/L — AB (ref 0–35)
AST: 43 U/L — ABNORMAL HIGH (ref 0–37)
Albumin: 3.8 g/dL (ref 3.5–5.2)
Alkaline Phosphatase: 37 U/L — ABNORMAL LOW (ref 39–117)
BILIRUBIN TOTAL: 0.6 mg/dL (ref 0.2–1.2)
BUN: 19 mg/dL (ref 6–23)
CHLORIDE: 108 meq/L (ref 96–112)
CO2: 26 meq/L (ref 19–32)
CREATININE: 0.82 mg/dL (ref 0.40–1.20)
Calcium: 9.4 mg/dL (ref 8.4–10.5)
GFR: 75.35 mL/min (ref >60.00)
GLUCOSE: 99 mg/dL (ref 70–99)
Potassium: 4.4 mEq/L (ref 3.5–5.1)
Sodium: 140 mEq/L (ref 135–145)
TOTAL PROTEIN: 6.6 g/dL (ref 6.0–8.3)

## 2015-09-08 NOTE — Telephone Encounter (Signed)
Pt has been notified of labs and states she will be in today

## 2015-09-08 NOTE — Telephone Encounter (Signed)
-----   Message from Barron Alvine, RN sent at 08/11/2015  9:54 AM EDT ----- Pt to get labs see 08/11/15 note

## 2015-09-14 ENCOUNTER — Other Ambulatory Visit: Payer: Self-pay

## 2015-09-14 DIAGNOSIS — K50919 Crohn's disease, unspecified, with unspecified complications: Secondary | ICD-10-CM

## 2015-09-22 ENCOUNTER — Telehealth: Payer: Self-pay | Admitting: Gastroenterology

## 2015-09-22 NOTE — Telephone Encounter (Signed)
Pt states she is taking azothiaprine and wants to know if she can take the shingles vaccine. Please advise.

## 2015-09-22 NOTE — Telephone Encounter (Signed)
No she should not

## 2015-09-22 NOTE — Telephone Encounter (Signed)
Spoke with pt and she is aware.

## 2015-10-02 ENCOUNTER — Ambulatory Visit (INDEPENDENT_AMBULATORY_CARE_PROVIDER_SITE_OTHER): Payer: Managed Care, Other (non HMO) | Admitting: Family Medicine

## 2015-10-02 VITALS — BP 120/78 | HR 80 | Wt 260.0 lb

## 2015-10-02 DIAGNOSIS — S6000XA Contusion of unspecified finger without damage to nail, initial encounter: Secondary | ICD-10-CM

## 2015-10-02 NOTE — Progress Notes (Signed)
   Subjective:    Patient ID: Connie Porter, female    DOB: Jan 17, 1956, 60 y.o.   MRN: [id number]  HPI She complains of swelling and numb feeling to the right third finger. No history of injury.   Review of Systems     Objective:   Physical Exam the right third finger is ecchymotic mainly on the dorsal surface. Normal strength. No ligamentous laxity. No bony tenderness. Joints show no swelling or discomfort. Full motion.      Assessment & Plan:  Finger contusion, initial encounter Although she does not remember injuring it, thinks she contused her finger. Recommend supportive care.

## 2015-11-21 ENCOUNTER — Other Ambulatory Visit (INDEPENDENT_AMBULATORY_CARE_PROVIDER_SITE_OTHER): Payer: Managed Care, Other (non HMO)

## 2015-11-21 DIAGNOSIS — K50919 Crohn's disease, unspecified, with unspecified complications: Secondary | ICD-10-CM | POA: Diagnosis not present

## 2015-11-21 LAB — COMPREHENSIVE METABOLIC PANEL
ALBUMIN: 3.6 g/dL (ref 3.5–5.2)
ALK PHOS: 40 U/L (ref 39–117)
ALT: 55 U/L — AB (ref 0–35)
AST: 40 U/L — ABNORMAL HIGH (ref 0–37)
BILIRUBIN TOTAL: 0.5 mg/dL (ref 0.2–1.2)
BUN: 16 mg/dL (ref 6–23)
CALCIUM: 9.1 mg/dL (ref 8.4–10.5)
CO2: 25 mEq/L (ref 19–32)
Chloride: 108 mEq/L (ref 96–112)
Creatinine, Ser: 0.89 mg/dL (ref 0.40–1.20)
GFR: 68.51 mL/min (ref >60.00)
Glucose, Bld: 117 mg/dL — ABNORMAL HIGH (ref 70–99)
Potassium: 3.7 mEq/L (ref 3.5–5.1)
Sodium: 142 mEq/L (ref 135–145)
Total Protein: 6.6 g/dL (ref 6.0–8.3)

## 2015-11-22 ENCOUNTER — Ambulatory Visit (INDEPENDENT_AMBULATORY_CARE_PROVIDER_SITE_OTHER): Payer: Managed Care, Other (non HMO) | Admitting: Gastroenterology

## 2015-11-22 ENCOUNTER — Encounter: Payer: Self-pay | Admitting: Gastroenterology

## 2015-11-22 VITALS — BP 126/84 | HR 88 | Ht 67.5 in | Wt 260.0 lb

## 2015-11-22 DIAGNOSIS — K50119 Crohn's disease of large intestine with unspecified complications: Secondary | ICD-10-CM | POA: Diagnosis not present

## 2015-11-22 NOTE — Progress Notes (Signed)
Progress Notes    Review of gastrointestinal problems:  1.Crohn's ileocolitis: Workup at [facility].Marland KitchenMarland KitchenPromethius IBD Serology 7 testing 02/2008 "pattern consistent with IBD.Marland KitchenMarland KitchenCrohn's." Colonoscopy 01/2008 showed terminal ileum and right colon ulcerations, pathology c/w acute and chronic ileitis, colitis, also HP polyps. Colonoscopy Path report from 2006 showed HP polyps. Office note 05/2009 documented previous C. diff colitis (treated empirically and also after toxin +). 2012 doing well on oral mesalamine 3.6 gms/day. ? Worsening of disease 03/2013, c. Diff PCR neg; increased mesalamine to 4.8gm daily. Colonoscopy 04/2012 found essentially normal colon but moderate to severe ileitis (path confirmed acute and chronic inflammation), started on 231m azathiaprine daily Had erythema nodosum 2014, early, improved with steroids. 11/2012 doing very well on 200 azathiaprine daily. 04/2013: urgency, straining: felt to be more functional then IBD related; fiber supplements stopped. 12/2013 doing well on 200 azathiaprine daily. 03/2015 MRI enterograophy "mild wall thickening in TI and cecum...active crohn's disease", elevated inflammatory markers and so azathiaprine dose increased to 2573mdaily 2. Dysphasia: EGD August, 2011 found Schatzki's ring that was dilated to 20 mm, small hiatal hernia, tortuous esophagus. Swallowing much improved. March 2012 PPI only every other day. Repeat EGD 03/2013 for dysphagia showed 4cn HH, tortuous distal esophagus, Schatzki's ring that was dilated to 2058m3. Thrombosed external hemorrhoid 2016, this was around the time of constipation related to pain medicine after a knee surgery. Resolve with conservative therapy  HPI: This is a very pleasant 60 45ar old woman whom I last saw 3 or 4 months ago. Chief complaint is Crohn's ileocolitis  Weight is up another 8 pounds since last visit (same scale here in GI).  Has been on vacation, gaining weight.  Will be watching grandchilren  soon.  Her bowels are still pretty inconsistent overall but in past 2 weeks (2-3 BMs in AM, often loose but and formed).  She changed from citrucel to metamucil, it is pill form and she remembers it better that way.  Still on azathiaprine 250m21mce daily.     Past Medical History:  Diagnosis Date  . Crohn's disease (HCC)[place]. Dental crowns present   . High cholesterol   . History of hiatal hernia   . Osteoarthritis of knee, unilateral 09/2014   right   . 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 Seasonal allergies     Past Surgical History:  Procedure Laterality Date  . APPENDECTOMY    . CESAREAN SECTION     x 3  . CHOLECYSTECTOMY    . COLONOSCOPY    . ESOPHAGOGASTRODUODENOSCOPY (EGD) WITH ESOPHAGEAL DILATION     x 2  . PATELLA-FEMORAL ARTHROPLASTY Right 10/07/2014   Procedure: RIGHT PATELLA-FEMORAL ARTHROPLASTY;  Surgeon: JoshMarchia Bond;  [facility]ics;  Laterality: Right;  . TONSILLECTOMY AND ADENOIDECTOMY    . WISDOM TOOTH EXTRACTION      Current Outpatient Prescriptions  Medication Sig Dispense Refill  . aspirin 81 MG tablet Take 81 mg by mouth daily.    . azMarland KitchenTHIOprine (IMURAN) 50 MG tablet Take 5 tablets (250 mg total) by mouth daily. 450 tablet 3  . CALCIUM CITRATE PO Take 1 tablet by mouth 2 (two) times daily.    . Cholecalciferol (VITAMIN D-3 PO) Take 1 capsule by mouth at bedtime.    . Cyanocobalamin (VITAMIN B-12 PO) Take 1 tablet by mouth daily.    . fexofenadine (ALLEGRA) 180 MG tablet Take 180 mg by mouth daily.    . leMarland Kitchenocetirizine (XYZAL) 5 MG  tablet Take 5 mg by mouth every evening.    . montelukast (SINGULAIR) 10 MG tablet Take 10 mg by mouth at bedtime.    . Multiple Vitamin (MULITIVITAMIN WITH MINERALS) TABS Take 1 tablet by mouth 2 (two) times daily.    . Omega-3 Fatty Acids (FISH OIL PO) Take 2 capsules by mouth 2 (two) times daily.     . pravastatin (PRAVACHOL) 20 MG tablet Take 1 tablet (20  mg total) by mouth daily. 90 tablet 3   No current facility-administered medications for this visit.     Allergies as of 11/22/2015  . (No Known Allergies)    Family History  Problem Relation Age of Onset  . Heart disease Father   . Hyperlipidemia Father   . Heart disease Brother 85    MI  . Hyperlipidemia Mother   . Arthritis Mother   . Asthma Son   . Stroke Maternal Grandfather   . Colon cancer Neg Hx     Social History   Social History  . Marital status: Married    Spouse name: N/A  . Number of children: 3  . Years of education: N/A   Occupational History  . Retired Pharmacist, hospital    Social History Main Topics  . Smoking status: Never Smoker  . Smokeless tobacco: Never Used  . Alcohol use Yes     Comment: weekends  . Drug use: No  . Sexual activity: Yes    Birth control/ protection: Post-menopausal   Other Topics Concern  . Not on file   Social History Narrative  . No narrative on file     Physical Exam: BP 126/84 (BP Location: Left Arm, Patient Position: Sitting, Cuff Size: Large)   Pulse 88   Ht 5' 7.5" (1.715 m)   Wt 260 lb (117.9 kg)   BMI 40.12 kg/m  Constitutional: generally well-appearing Psychiatric: alert and oriented x3 Abdomen: soft, nontender, nondistended, no obvious ascites, no peritoneal signs, normal bowel sounds   Assessment and plan: 60 y.o. female with Crohn's ileocolitis  Her GI symptoms seem pretty well controlled on azathioprine 250 mg once daily. Liver tests have slightly elevated I think that is related to her 16 pound weight gain over the past several months. Perhaps there is mild contribution of azathioprine. Either way it is very low-level irritation my plan is to simply follow it for now. She will continue her azathioprine at the current dose. She will return to see me in 4-5 months and she will have repeat CBC and complete metabolic profile in 3 months.   Owens Loffler, MD [facility] 11/22/2015, 9:00 AM

## 2015-11-22 NOTE — Patient Instructions (Signed)
No changes with azathiaprine dose Labs in 3 months (cbc, cmet). Please return to see Dr. Ardis Hughs in 4-5 months, sooner if any issues.

## 2016-01-20 ENCOUNTER — Other Ambulatory Visit: Payer: Self-pay | Admitting: Family Medicine

## 2016-01-22 ENCOUNTER — Other Ambulatory Visit: Payer: Self-pay | Admitting: Family Medicine

## 2016-02-23 ENCOUNTER — Encounter: Payer: Self-pay | Admitting: Family Medicine

## 2016-02-23 ENCOUNTER — Ambulatory Visit (INDEPENDENT_AMBULATORY_CARE_PROVIDER_SITE_OTHER): Payer: Managed Care, Other (non HMO) | Admitting: Family Medicine

## 2016-02-23 VITALS — BP 126/82 | HR 96 | Wt 261.4 lb

## 2016-02-23 DIAGNOSIS — Z1159 Encounter for screening for other viral diseases: Secondary | ICD-10-CM | POA: Diagnosis not present

## 2016-02-23 DIAGNOSIS — E785 Hyperlipidemia, unspecified: Secondary | ICD-10-CM | POA: Diagnosis not present

## 2016-02-23 DIAGNOSIS — Z23 Encounter for immunization: Secondary | ICD-10-CM | POA: Diagnosis not present

## 2016-02-23 DIAGNOSIS — B079 Viral wart, unspecified: Secondary | ICD-10-CM

## 2016-02-23 LAB — LIPID PANEL
CHOLESTEROL: 210 mg/dL — AB (ref <200)
HDL: 96 mg/dL (ref >50)
LDL CALC: 104 mg/dL — AB (ref <100)
Total CHOL/HDL Ratio: 2.2 Ratio (ref <5.0)
Triglycerides: 52 mg/dL (ref <150)
VLDL: 10 mg/dL (ref <30)

## 2016-02-23 NOTE — Patient Instructions (Signed)
Soak the area in warm water 15-30 minutes.  You can try using duct tape to the wart and leave in place for 6 days. Remove the tape and soak in warm water.  You can use OTC treatment.   Warts Warts are small growths on the skin. They are common and can occur on various areas of the body. A person may have one wart or multiple warts. Most warts are not painful, and they usually do not cause problems. However, warts can cause pain if they are large or occur in an area of the body where pressure will be applied to them, such as the bottom of the foot. In many cases, warts do not require treatment. They usually go away on their own over a period of many months to a couple years. Various treatments may be done for warts that cause problems or do not go away. Sometimes, warts go away and then come back again. CAUSES Warts are caused by a type of virus that is called human papillomavirus (HPV). This virus can spread from person to person through direct contact. Warts can also spread to other areas of the body when a person scratches a wart and then scratches another area of his or her body.  RISK FACTORS Warts are more likely to develop in:  People who are 78-66 years of age.  People who have a weakened body defense system (immune system). SYMPTOMS A wart may be round or oval or have an irregular shape. Most warts have a rough surface. Warts may range in color from skin color to light yellow, brown, or gray. They are generally less than  inch (1.3 cm) in size. Most warts are painless, but some can be painful when pressure is applied to them. DIAGNOSIS A wart can usually be diagnosed from its appearance. In some cases, a tissue sample may be removed (biopsy) to be looked at under a microscope. TREATMENT In many cases, warts do not need treatment. If treatment is needed, options may include:  Applying medicated solutions, creams, or patches to the wart. These may be over-the-counter or prescription  medicines that make the skin soft so that layers will gradually shed away. In many cases, the medicine is applied one or two times per day and covered with a bandage.  Putting duct tape over the top of the wart (occlusion). You will leave the tape in place for as long as told by your health care provider, then you will replace it with a new strip of tape. This is done until the wart goes away.  Freezing the wart with liquid nitrogen (cryotherapy).  Burning the wart with:  Laser treatment.  An electrified probe (electrocautery).  Injection of a medicine (Candida antigen) into the wart to help the body's immune system to fight off the wart.  Surgery to remove the wart. HOME CARE INSTRUCTIONS  Apply over-the-counter and prescription medicines only as told by your health care provider.  Do not apply over-the-counter wart medicines to your face or genitals before you ask your health care provider if it is okay to do so.  Do not scratch or pick at a wart.  Wash your hands after you touch a wart.  Avoid shaving hair that is over a wart.  Keep all follow-up visits as told by your health care provider. This is important. SEEK MEDICAL CARE IF:  Your warts do not improve after treatment.  You have redness, swelling, or pain at the site of a wart.  You  have bleeding from a wart that does not stop with light pressure.  You have diabetes and you develop a wart.   This information is not intended to replace advice given to you by your health care provider. Make sure you discuss any questions you have with your health care provider.   Document Released: 01/09/2005 Document Revised: 12/21/2014 Document Reviewed: 06/27/2014 Elsevier Interactive Patient Education [employer].

## 2016-02-23 NOTE — Progress Notes (Signed)
   Subjective:    Patient ID: Connie Porter, female    DOB: 1955-11-18, 60 y.o.   MRN: [id number]  HPI Chief Complaint  Patient presents with  . discuss meds    discuss cholestrol meds , possible bllod work, flu shot    She is here today to get her cholesterol medication refilled. States she has no concerns about the medication and no side effects. States she has not had cholesterol checked in a year or longer.  She did not follow up and I have not seen her since October 2016 for her CPE. She is being followed by Dr. Ardis Hughs on a regular basis due to Crohn's disease. She is taking Azothiaprine.  Denies fever, chills, headache, dizziness, chest pain, palpitations, DOE, abdominal pain, nausea, vomiting, diarrhea  She would like to get a flu shot today.   She is also requesting that I test her for hepatitis C since she is in the recommended age group for this testing.  Other providers:  Dr. Ardis Hughs- Crohns- sees him regularly, every 3 months. Is due January 3rd.  [facility] Saw him in March 2017 for pap and pelvic. Dr. Mardelle Matte - knee replacement at end of June 2016 Dr. Donneta Romberg- allergies, fall- ragweed, Rag[place] sublingual.    She cannot take the shingles shot- she is taking azothiaprine.   Review of Systems Pertinent positives and negatives in the history of present illness.     Objective:   Physical Exam BP 126/82   Pulse 96   Wt 261 lb 6.4 oz (118.6 kg)   SpO2 97%   BMI 40.34 kg/m   Alert and oriented and in no acute distress. Small, pale skin toned macule with a rough surface on the dorsal surface of the 1st digit on left hand.        Assessment & Plan:  Hyperlipidemia, unspecified hyperlipidemia type - Plan: Lipid panel  Needs flu shot - Plan: Flu Vaccine QUAD 36+ mos IM  Need for hepatitis C screening test - Plan: Hepatitis C antibody  Morbid obesity ([place]) - Plan: Lipid panel  Viral wart on finger  Advised that she should have a CPE however  patient insists that she sees so many other providers that she only wants to have her fasting cholesterol checked and have me refill her Pravastatin.  Plan to check fasting lipids and will refill medication as appropriate.  Discussed low-fat, low-cholesterol diet. She is aware that her BMI now places her in the morbidly obese category. Review of her chart does show that she has had mildly elevated LFTs and Dr. Ardis Hughs is monitoring this. Patient states he is not sure if this is related to weight gain versus medication she is on for Crohn's disease. Hepatitis C screening test performed per patient request and due to recommendation for age group. Flu shot given.  Discussed home treatment of wart on her finger. She will return if this is not getting better.

## 2016-02-24 LAB — HEPATITIS C ANTIBODY: HCV Ab: NEGATIVE

## 2016-02-25 ENCOUNTER — Other Ambulatory Visit: Payer: Self-pay | Admitting: Family Medicine

## 2016-02-25 MED ORDER — PRAVASTATIN SODIUM 20 MG PO TABS: 20.0000 mg | ORAL_TABLET | Freq: Every day | ORAL | 1 refills | Status: DC

## 2016-03-06 ENCOUNTER — Other Ambulatory Visit (INDEPENDENT_AMBULATORY_CARE_PROVIDER_SITE_OTHER): Payer: Managed Care, Other (non HMO)

## 2016-03-06 DIAGNOSIS — K50119 Crohn's disease of large intestine with unspecified complications: Secondary | ICD-10-CM

## 2016-03-06 LAB — COMPREHENSIVE METABOLIC PANEL
ALK PHOS: 35 U/L — AB (ref 39–117)
ALT: 52 U/L — ABNORMAL HIGH (ref 0–35)
AST: 46 U/L — ABNORMAL HIGH (ref 0–37)
Albumin: 3.6 g/dL (ref 3.5–5.2)
BUN: 16 mg/dL (ref 6–23)
CO2: 27 mEq/L (ref 19–32)
Calcium: 9.3 mg/dL (ref 8.4–10.5)
Chloride: 106 mEq/L (ref 96–112)
Creatinine, Ser: 0.9 mg/dL (ref 0.40–1.20)
GFR: 67.56 mL/min (ref >60.00)
GLUCOSE: 135 mg/dL — AB (ref 70–99)
POTASSIUM: 4.1 meq/L (ref 3.5–5.1)
Sodium: 141 mEq/L (ref 135–145)
TOTAL PROTEIN: 6.5 g/dL (ref 6.0–8.3)
Total Bilirubin: 0.8 mg/dL (ref 0.2–1.2)

## 2016-03-06 LAB — CBC WITH DIFFERENTIAL/PLATELET
Basophils Absolute: 0 10*3/uL (ref 0.0–0.1)
Basophils Relative: 0.8 % (ref 0.0–3.0)
EOS PCT: 2.1 % (ref 0.0–5.0)
Eosinophils Absolute: 0.1 10*3/uL (ref 0.0–0.7)
HCT: 29.6 % — ABNORMAL LOW (ref 36.0–46.0)
Hemoglobin: 9.8 g/dL — ABNORMAL LOW (ref 12.0–15.0)
LYMPHS ABS: 0.3 10*3/uL — AB (ref 0.7–4.0)
Lymphocytes Relative: 12.3 % (ref 12.0–46.0)
MCHC: 33.1 g/dL (ref 30.0–36.0)
MCV: 93.5 fl (ref 78.0–100.0)
MONOS PCT: 8 % (ref 3.0–12.0)
Monocytes Absolute: 0.2 10*3/uL (ref 0.1–1.0)
NEUTROS ABS: 2 10*3/uL (ref 1.4–7.7)
NEUTROS PCT: 76.8 % (ref 43.0–77.0)
PLATELETS: 438 10*3/uL — AB (ref 150.0–400.0)
RBC: 3.17 Mil/uL — AB (ref 3.87–5.11)
RDW: 15.3 % (ref 11.5–15.5)
WBC: 2.6 10*3/uL — ABNORMAL LOW (ref 4.0–10.5)

## 2016-03-11 ENCOUNTER — Other Ambulatory Visit: Payer: Self-pay

## 2016-03-11 DIAGNOSIS — K50119 Crohn's disease of large intestine with unspecified complications: Secondary | ICD-10-CM

## 2016-04-06 IMAGING — CR DG KNEE 1-2V PORT*R*
2 series · 2 of 2 positions shown · non-contrast
Comparison: None.

CLINICAL DATA: Right knee replacement.

EXAM:
PORTABLE RIGHT KNEE - 1-2 VIEW

[ap/obl knee]
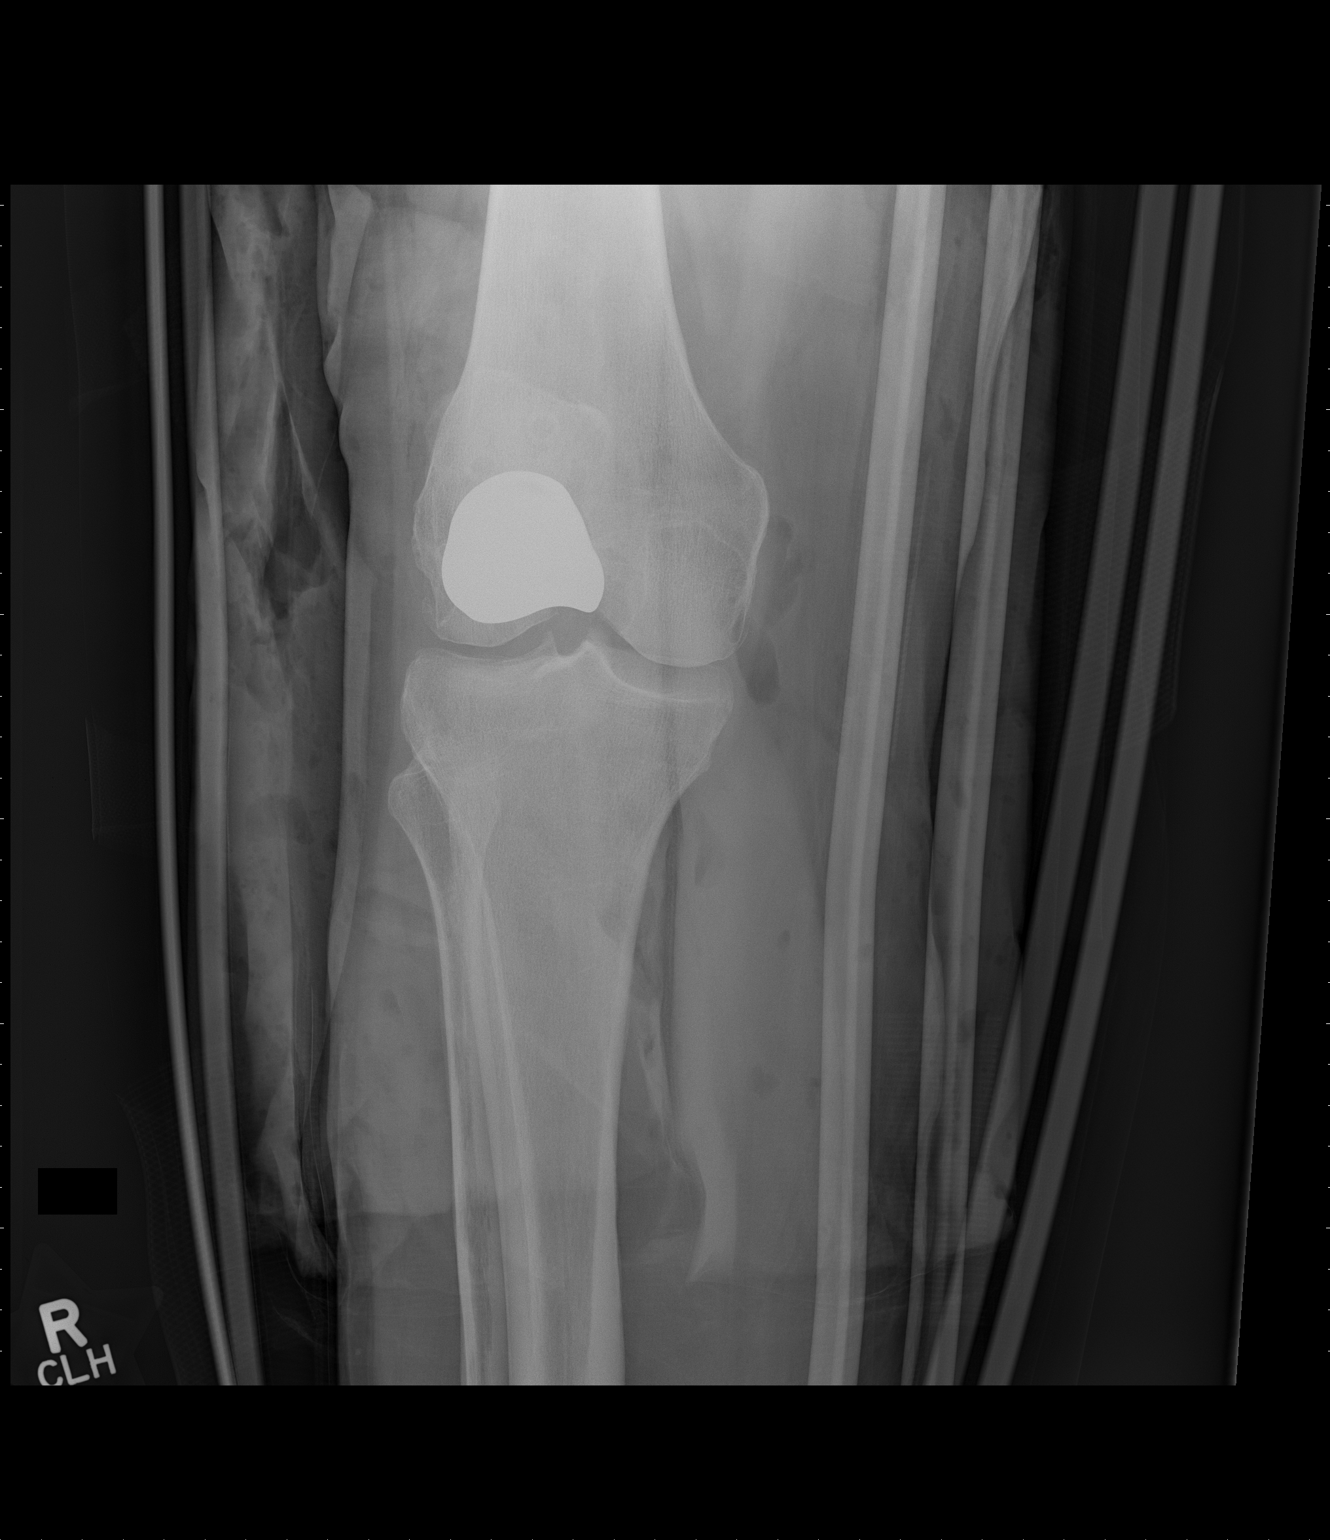

[knee lat]
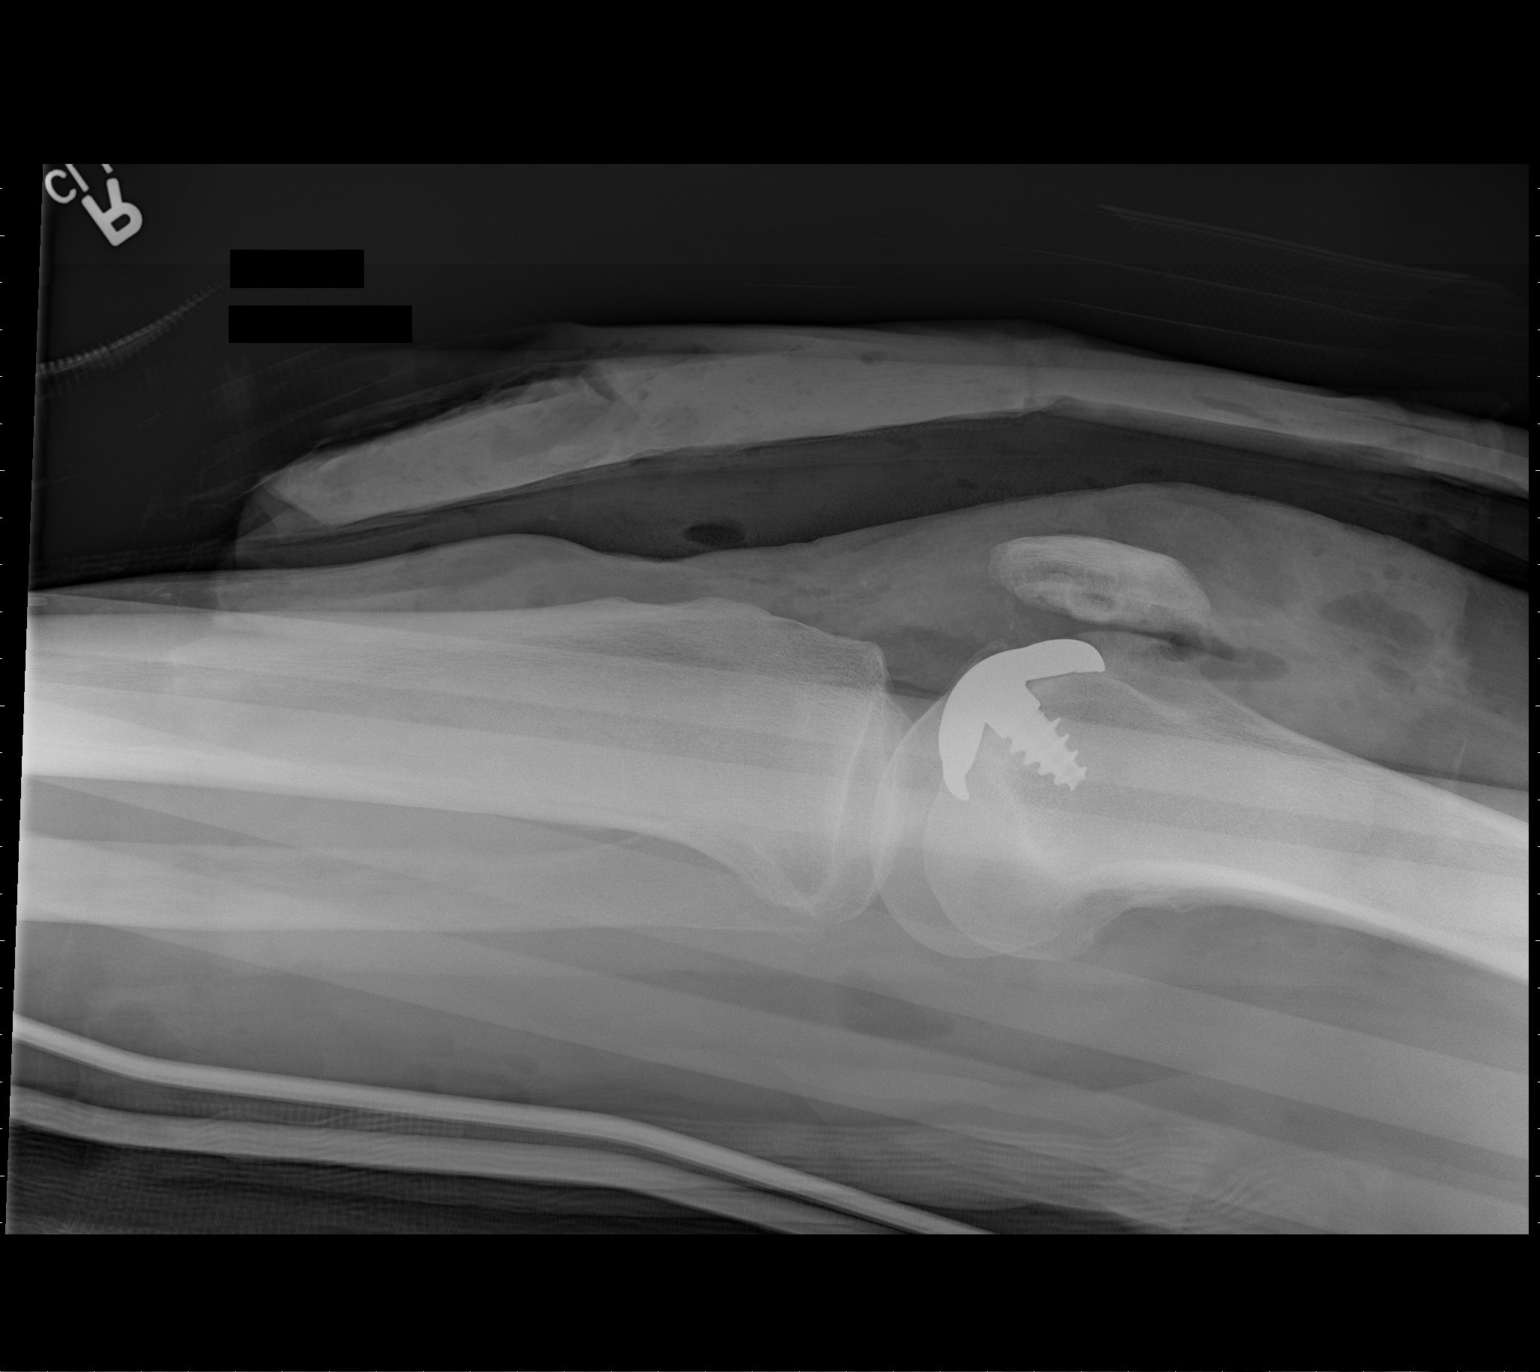

[2 of 2 positions shown; findings below may reference images not displayed]

FINDINGS: Right knee arthroplasty noted. No acute bony abnormality. Hardware
intact. Soft tissue air consistent with surgery.
IMPRESSION: Right knee arthroplasty. Hardware intact. No acute bony abnormality.

## 2016-04-11 ENCOUNTER — Other Ambulatory Visit (INDEPENDENT_AMBULATORY_CARE_PROVIDER_SITE_OTHER): Payer: Managed Care, Other (non HMO)

## 2016-04-11 DIAGNOSIS — K50119 Crohn's disease of large intestine with unspecified complications: Secondary | ICD-10-CM | POA: Diagnosis not present

## 2016-04-11 LAB — COMPREHENSIVE METABOLIC PANEL
ALBUMIN: 4 g/dL (ref 3.5–5.2)
ALT: 21 U/L (ref 0–35)
AST: 24 U/L (ref 0–37)
Alkaline Phosphatase: 45 U/L (ref 39–117)
BILIRUBIN TOTAL: 0.5 mg/dL (ref 0.2–1.2)
BUN: 16 mg/dL (ref 6–23)
CALCIUM: 9.8 mg/dL (ref 8.4–10.5)
CO2: 31 mEq/L (ref 19–32)
CREATININE: 0.85 mg/dL (ref 0.40–1.20)
Chloride: 104 mEq/L (ref 96–112)
GFR: 72.15 mL/min (ref >60.00)
Glucose, Bld: 98 mg/dL (ref 70–99)
Potassium: 4 mEq/L (ref 3.5–5.1)
SODIUM: 140 meq/L (ref 135–145)
TOTAL PROTEIN: 7.1 g/dL (ref 6.0–8.3)

## 2016-04-11 LAB — CBC WITH DIFFERENTIAL/PLATELET
Basophils Absolute: 0 10*3/uL (ref 0.0–0.1)
Basophils Relative: 1 % (ref 0.0–3.0)
EOS ABS: 0.1 10*3/uL (ref 0.0–0.7)
Eosinophils Relative: 1.9 % (ref 0.0–5.0)
HEMATOCRIT: 30 % — AB (ref 36.0–46.0)
HEMOGLOBIN: 10.1 g/dL — AB (ref 12.0–15.0)
LYMPHS PCT: 14.8 % (ref 12.0–46.0)
Lymphs Abs: 0.5 10*3/uL — ABNORMAL LOW (ref 0.7–4.0)
MCHC: 33.5 g/dL (ref 30.0–36.0)
MCV: 87.7 fl (ref 78.0–100.0)
MONO ABS: 0.2 10*3/uL (ref 0.1–1.0)
Monocytes Relative: 7.2 % (ref 3.0–12.0)
Neutro Abs: 2.5 10*3/uL (ref 1.4–7.7)
Neutrophils Relative %: 75.1 % (ref 43.0–77.0)
Platelets: 350 10*3/uL (ref 150.0–400.0)
RBC: 3.42 Mil/uL — AB (ref 3.87–5.11)
RDW: 14.7 % (ref 11.5–15.5)
WBC: 3.3 10*3/uL — AB (ref 4.0–10.5)

## 2016-04-16 ENCOUNTER — Ambulatory Visit (INDEPENDENT_AMBULATORY_CARE_PROVIDER_SITE_OTHER): Payer: Managed Care, Other (non HMO) | Admitting: Gastroenterology

## 2016-04-16 ENCOUNTER — Encounter: Payer: Self-pay | Admitting: Gastroenterology

## 2016-04-16 VITALS — BP 134/82 | HR 70 | Ht 67.5 in | Wt 268.0 lb

## 2016-04-16 DIAGNOSIS — K50119 Crohn's disease of large intestine with unspecified complications: Secondary | ICD-10-CM

## 2016-04-16 MED ORDER — AZATHIOPRINE 50 MG PO TABS: 200.0000 mg | ORAL_TABLET | Freq: Every day | ORAL | 11 refills | Status: DC

## 2016-04-16 NOTE — Progress Notes (Signed)
Review of gastrointestinal problems:  1.Crohn's ileocolitis:Workup at [facility].Marland KitchenMarland KitchenPromethius IBD Serology 7 testing 02/2008 "pattern consistent with IBD.Marland KitchenMarland KitchenCrohn's."Colonoscopy 10/2009showed terminal ileum and right colon ulcerations, pathology c/w acute and chronic ileitis, colitis, also HP polyps. Colonoscopy Path report from 2006 showed HP polyps. Office note 05/2009 documented previous C. diff colitis (treated empirically and also after toxin +). 2012 doing well on oral mesalamine 3.6 gms/day. ? Worsening of disease 03/2013, c. Diff PCR neg; increased mesalamine to 4.8gm daily. Colonoscopy 1/2028fund essentially normal colon but moderate to severe ileitis (path confirmed acute and chronic inflammation), started on 2033mazathiaprine daily Had erythema nodosum 2014, early, improved with steroids. 11/2012 doing very well on 200 azathiaprine daily. 04/2013: urgency, straining: felt to be more functional then IBD related; fiber supplements stopped. 12/2013 doing well on 200 azathiaprine daily. 03/2015 MRI enterograophy "mild wall thickening in TI and cecum...active crohn's disease", elevated inflammatory markers and so azathiaprine dose increased to 25065maily 2. Dysphasia:EGD August, 2011f33f Schatzki's ring that was dilated to 20 mm, small hiatal hernia, tortuous esophagus. Swallowing much improved. March 2012 PPI only every other day. Repeat EGD 12/2014for dysphagia showed 4cn HH, tortuous distal esophagus, Schatzki's ring that was dilated to 20mm59m Thrombosed external hemorrhoid2016, this was around the time of constipation related to pain medicine after a knee surgery. Resolve with conservative therapy   HPI: This is a  very pleasant 60 ye19 old woman whom I last saw 3 or 4 months ago. She had repeat labs drawn last week and her liver tests are completely normal. Her blood counts look more improved. This is all probably because we turned her azathioprine down from 250 mg a day to 200 mg a day.   She is also trying to exercise some.  Chief complaint is Crohn's ileocolitis  Pretty inconsistent bowels.    She's started excercising, eating a bit better.  Will occasionally have to push and strain occasionally.  Takes metamucil pills, two per day.  She gained 8 pounds since last visit 5 months ago, BMI is really getting up there  ROS: complete GI ROS as described in HPI.  Constitutional:  No unintentional weight loss   Past Medical History:  Diagnosis Date  . Crohn's disease (HCC) [place] Dental crowns present   . High cholesterol   . History of hiatal hernia   . Osteoarthritis of knee, unilateral 09/2014   right   . 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 Seasonal allergies     Past Surgical History:  Procedure Laterality Date  . APPENDECTOMY    . CESAREAN SECTION     x 3  . CHOLECYSTECTOMY    . COLONOSCOPY    . ESOPHAGOGASTRODUODENOSCOPY (EGD) WITH ESOPHAGEAL DILATION     x 2  . PATELLA-FEMORAL ARTHROPLASTY Right 10/07/2014   Procedure: RIGHT PATELLA-FEMORAL ARTHROPLASTY;  Surgeon: JoshuMarchia Bond  [facility];  Laterality: Right;  . TONSILLECTOMY AND ADENOIDECTOMY    . WISDOM TOOTH EXTRACTION      Current Outpatient Prescriptions  Medication Sig Dispense Refill  . aspirin 81 MG tablet Take 81 mg by mouth daily.    . azaMarland KitchenHIOprine (IMURAN) 50 MG tablet Take 5 tablets (250 mg total) by mouth daily. 450 tablet 3  . CALCIUM CITRATE PO Take 1 tablet by mouth 2 (two) times daily.    . Cholecalciferol (VITAMIN D-3 PO) Take 1 capsule by mouth at bedtime.    . Cyanocobalamin (VITAMIN B-12 PO) Take 1 tablet by mouth  daily.    . fexofenadine (ALLEGRA) 180 MG tablet Take 180 mg by mouth daily.    Marland Kitchen levocetirizine (XYZAL) 5 MG tablet Take 5 mg by mouth every evening.    . montelukast (SINGULAIR) 10 MG tablet Take 10 mg by mouth at bedtime.    . Multiple Vitamin (MULITIVITAMIN WITH MINERALS) TABS Take 1 tablet by mouth  2 (two) times daily.    . Omega-3 Fatty Acids (FISH OIL PO) Take 2 capsules by mouth 2 (two) times daily.     . pravastatin (PRAVACHOL) 20 MG tablet Take 1 tablet (20 mg total) by mouth daily. 90 tablet 1   No current facility-administered medications for this visit.     Allergies as of 04/16/2016  . (No Known Allergies)    Family History  Problem Relation Age of Onset  . Heart disease Father   . Hyperlipidemia Father   . Heart disease Brother 47    MI  . Hyperlipidemia Mother   . Arthritis Mother   . Asthma Son   . Stroke Maternal Grandfather   . Colon cancer Neg Hx     Social History   Social History  . Marital status: Married    Spouse name: N/A  . Number of children: 3  . Years of education: N/A   Occupational History  . Retired Pharmacist, hospital    Social History Main Topics  . Smoking status: Never Smoker  . Smokeless tobacco: Never Used  . Alcohol use Yes     Comment: weekends  . Drug use: No  . Sexual activity: Yes    Birth control/ protection: Post-menopausal   Other Topics Concern  . Not on file   Social History Narrative  . No narrative on file     Physical Exam: BP 134/82   Pulse 70   Ht 5' 7.5" (1.715 m)   Wt 268 lb (121.6 kg)   BMI 41.36 kg/m  Constitutional: Obese Psychiatric: alert and oriented x3 Abdomen: soft, nontender, nondistended, no obvious ascites, no peritoneal signs, normal bowel sounds No peripheral edema noted in lower extremities  Assessment and plan: 61 y.o. female with Crohn's ileocolitis, obesity  I think her liver test elevation and dropping blood counts were probably from azathioprine. Decreasing her dose of this has improved both of these numbers. She has no symptoms of Crohn's ileocolitis. She is actually a bit constipated lately since changing her diet around and exercising more. She takes Metamucil already on a daily basis. I recommended she consider MiraLAX as needed. She will return to see me in 6 months and she will  have repeat CBC, complete metabolic profile in 5 months. She knows to call sooner if she is having troubles.   Owens Loffler, MD [facility] 04/16/2016, 3:50 PM

## 2016-04-16 NOTE — Patient Instructions (Addendum)
Continue azathiaprine at 221m once daily. Will write new script today. Try miralax occasionally, even daily if needed. Repeat cbc, cmet in 5 months. Please return to see Dr. JArdis Hughsin 6 months.

## 2016-04-29 ENCOUNTER — Ambulatory Visit (INDEPENDENT_AMBULATORY_CARE_PROVIDER_SITE_OTHER): Payer: Managed Care, Other (non HMO) | Admitting: Family Medicine

## 2016-04-29 ENCOUNTER — Encounter: Payer: Self-pay | Admitting: Family Medicine

## 2016-04-29 VITALS — BP 120/76 | HR 80 | Temp 99.0°F | Ht 67.5 in | Wt 258.6 lb

## 2016-04-29 DIAGNOSIS — J209 Acute bronchitis, unspecified: Secondary | ICD-10-CM

## 2016-04-29 DIAGNOSIS — R05 Cough: Secondary | ICD-10-CM | POA: Diagnosis not present

## 2016-04-29 DIAGNOSIS — R059 Cough, unspecified: Secondary | ICD-10-CM

## 2016-04-29 MED ORDER — HYDROCODONE-HOMATROPINE 5-1.5 MG/5ML PO SYRP: 5.0000 mL | ORAL_SOLUTION | Freq: Three times a day (TID) | ORAL | 0 refills | Status: DC | PRN

## 2016-04-29 MED ORDER — AZITHROMYCIN 250 MG PO TABS: ORAL_TABLET | ORAL | 0 refills | Status: DC

## 2016-04-29 NOTE — Patient Instructions (Signed)
  Drink plenty of water. I recommend changing to plain Mucinex rather than the D (since you don't need the decongestant portion). Don't use mucinex along with the Delsym if the kind of delsym you have contains guaifenesin. Use the hydrocodone-containing cough syrup mostly just at bedtime, as it will cause sedation, and you shouldn't drive while taking it.   Contact us if you develop higher fevers and feeling worse over the next week--you may need to have the antibiotic changed. If you aren't 100% better by day 10 (when the antibiotic course has left your system), please call us for a refill (to start on day #11).

## 2016-04-29 NOTE — Progress Notes (Signed)
Chief Complaint  Patient presents with  . Cough    x 2 weeks. Last week had fever and chills and was in bed. Coughs so much that she throws up, gets into coughing fits. Lots of congestion-mucus is yellow. Last fever was last Thursday. Nothing in her sinuses-all in her chest.    2-3 weeks ago she started with throat congestion, sinus headaches, slight cough.  Last week it turned into more chest congestion, worsening of cough, and developed some fevers (low grade, Tm 100.2). Fever resolved, but has ongoing chest congestion and cough.  Hard to sleep due to cough.  +post-tussive emesis, sometimes is yellowish mucus.  No longer has sinus pressure or drainage.  She uses Neti-pot daily, and hasn't been getting drainage.  Phlegm is yellow-ish in color.  Most of the time it feels stuck in the chest.    She took Delsym syrup on/off x 2 weeks--she states it "loosens phlegm" on bottle, so may contain expectorant.  She also has taken Mucinex-D over the last week.  +sick contacts (granddaughters, she watches--runny noses)  PMH, PSH, SH reviewed.  Outpatient Encounter Prescriptions as of 04/29/2016  Medication Sig  . aspirin 81 MG tablet Take 81 mg by mouth daily.  Marland Kitchen azaTHIOprine (IMURAN) 50 MG tablet Take 4 tablets (200 mg total) by mouth daily.  Marland Kitchen CALCIUM CITRATE PO Take 1 tablet by mouth 2 (two) times daily.  . Cholecalciferol (VITAMIN D-3 PO) Take 1 capsule by mouth at bedtime.  . Cyanocobalamin (VITAMIN B-12 PO) Take 1 tablet by mouth daily.  Marland Kitchen Dextromethorphan-Guaifenesin (DELSYM COUGH/CHEST CONGEST DM) 5-100 MG/5ML LIQD Take 20 mLs by mouth 2 (two) times daily.  Marland Kitchen dextromethorphan-guaiFENesin (MUCINEX DM) 30-600 MG 12hr tablet Take 1 tablet by mouth 2 (two) times daily.  . fexofenadine (ALLEGRA) 180 MG tablet Take 180 mg by mouth daily.  Marland Kitchen levocetirizine (XYZAL) 5 MG tablet Take 5 mg by mouth every evening.  . montelukast (SINGULAIR) 10 MG tablet Take 10 mg by mouth at bedtime.  . Multiple Vitamin  (MULITIVITAMIN WITH MINERALS) TABS Take 1 tablet by mouth 2 (two) times daily.  . Omega-3 Fatty Acids (FISH OIL PO) Take 2 capsules by mouth 2 (two) times daily.   . pravastatin (PRAVACHOL) 20 MG tablet Take 1 tablet (20 mg total) by mouth daily.   No facility-administered encounter medications on file as of 04/29/2016.    No Known Allergies  ROS:  +fever/chills per HPI, resolved.  Denies syncope.  Some headaches and dizziness (hasn't been eating much, decreased appetite).  Denies nausea.  +post-tussive emesis; some nausea from mucus in throat just in the mornings. Denies diarrhea.  Denies bleeding, bruising, rashes.  PHYSICAL EXAM:  BP 120/76 (BP Location: Left Arm, Patient Position: Sitting, Cuff Size: Normal)   Pulse 80   Temp 99 F (37.2 C) (Tympanic)   Ht 5' 7.5" (1.715 m)   Wt 258 lb 9.6 oz (117.3 kg)   BMI 39.90 kg/m   Well appearing female in no distress, some throat-clearing HEENT: PERRL, EOMI, conjunctiva and sclera are clear. TM's and EAC's normal. Nasal mucosa without significant edema or erythema, no purulence. Sinuses are nontender, OP is clear Neck: no lymphadenopathy or mass Heart: regular rate and rhythm Lungs: clear bilaterally Skin: normal turgor, no rash Psych: normal mood, affect, hygiene and grooming Neuro: alert and oriented, cranial nerves intact, normal gait  ASSESSMENT/PLAN:  Acute bronchitis, unspecified organism - Plan: azithromycin (ZITHROMAX) 250 MG tablet  Cough - Plan: HYDROcodone-homatropine (HYCODAN) 5-1.5 MG/5ML syrup  Risks/side effects of meds reviewed. F/u prn persistent/worsening symptoms    Drink plenty of water. I recommend changing to plain Mucinex rather than the D (since you don't need the decongestant portion). Don't use mucinex along with the Delsym if the kind of delsym you have contains guaifenesin. Use the hydrocodone-containing cough syrup mostly just at bedtime, as it will cause sedation, and you shouldn't drive while  taking it.   Contact us if you develop higher fevers and feeling worse over the next week--you may need to have the antibiotic changed. If you aren't 100% better by day 10 (when the antibiotic course has left your system), please call us for a refill (to start on day #11).

## 2016-05-13 ENCOUNTER — Telehealth: Payer: Self-pay | Admitting: Family Medicine

## 2016-05-13 DIAGNOSIS — J209 Acute bronchitis, unspecified: Secondary | ICD-10-CM

## 2016-05-13 MED ORDER — AZITHROMYCIN 250 MG PO TABS: ORAL_TABLET | ORAL | 0 refills | Status: DC

## 2016-05-13 NOTE — Telephone Encounter (Signed)
Advise pt refill was sent in to her pharmacy

## 2016-05-13 NOTE — Telephone Encounter (Signed)
Informed pt that RX was ready

## 2016-05-13 NOTE — Telephone Encounter (Signed)
Pt called and wants to know if you will send her another round of amitotics in states she is still coughing and has some chest congestion she was feeling pretty good on Friday but by saturday she got to feeling worse, states she only feels about 50 percent better, pt can be reached at [phone] and pt uses [facility] [place] [address]

## 2016-09-04 LAB — HM PAP SMEAR: HM Pap smear: NEGATIVE

## 2016-09-13 ENCOUNTER — Other Ambulatory Visit: Payer: Self-pay | Admitting: Family Medicine

## 2016-10-07 ENCOUNTER — Other Ambulatory Visit: Payer: Self-pay

## 2016-10-07 ENCOUNTER — Other Ambulatory Visit (INDEPENDENT_AMBULATORY_CARE_PROVIDER_SITE_OTHER): Payer: Managed Care, Other (non HMO)

## 2016-10-07 DIAGNOSIS — K50119 Crohn's disease of large intestine with unspecified complications: Secondary | ICD-10-CM | POA: Diagnosis not present

## 2016-10-07 LAB — COMPREHENSIVE METABOLIC PANEL
ALT: 10 U/L (ref 0–35)
AST: 16 U/L (ref 0–37)
Albumin: 4 g/dL (ref 3.5–5.2)
Alkaline Phosphatase: 42 U/L (ref 39–117)
BUN: 17 mg/dL (ref 6–23)
CHLORIDE: 105 meq/L (ref 96–112)
CO2: 32 mEq/L (ref 19–32)
Calcium: 9.7 mg/dL (ref 8.4–10.5)
Creatinine, Ser: 0.88 mg/dL (ref 0.40–1.20)
GFR: 69.2 mL/min (ref >60.00)
GLUCOSE: 95 mg/dL (ref 70–99)
POTASSIUM: 4.6 meq/L (ref 3.5–5.1)
Sodium: 141 mEq/L (ref 135–145)
TOTAL PROTEIN: 6.8 g/dL (ref 6.0–8.3)
Total Bilirubin: 0.4 mg/dL (ref 0.2–1.2)

## 2016-10-07 LAB — CBC WITH DIFFERENTIAL/PLATELET
Basophils Absolute: 0 10*3/uL (ref 0.0–0.1)
Basophils Relative: 1 % (ref 0.0–3.0)
EOS ABS: 0.1 10*3/uL (ref 0.0–0.7)
EOS PCT: 2.4 % (ref 0.0–5.0)
HCT: 35.9 % — ABNORMAL LOW (ref 36.0–46.0)
Hemoglobin: 12 g/dL (ref 12.0–15.0)
LYMPHS ABS: 0.5 10*3/uL — AB (ref 0.7–4.0)
LYMPHS PCT: 17.3 % (ref 12.0–46.0)
MCHC: 33.3 g/dL (ref 30.0–36.0)
MCV: 95.3 fl (ref 78.0–100.0)
MONO ABS: 0.3 10*3/uL (ref 0.1–1.0)
MONOS PCT: 9.9 % (ref 3.0–12.0)
NEUTROS PCT: 69.4 % (ref 43.0–77.0)
Neutro Abs: 1.9 10*3/uL (ref 1.4–7.7)
Platelets: 314 10*3/uL (ref 150.0–400.0)
RBC: 3.77 Mil/uL — AB (ref 3.87–5.11)
RDW: 17.3 % — AB (ref 11.5–15.5)
WBC: 2.8 10*3/uL — AB (ref 4.0–10.5)

## 2016-10-23 ENCOUNTER — Other Ambulatory Visit (INDEPENDENT_AMBULATORY_CARE_PROVIDER_SITE_OTHER): Payer: Managed Care, Other (non HMO)

## 2016-10-23 ENCOUNTER — Ambulatory Visit (INDEPENDENT_AMBULATORY_CARE_PROVIDER_SITE_OTHER): Payer: Managed Care, Other (non HMO) | Admitting: Gastroenterology

## 2016-10-23 ENCOUNTER — Encounter: Payer: Self-pay | Admitting: Gastroenterology

## 2016-10-23 VITALS — BP 116/78 | HR 92 | Ht 67.5 in | Wt 264.8 lb

## 2016-10-23 DIAGNOSIS — K50119 Crohn's disease of large intestine with unspecified complications: Secondary | ICD-10-CM

## 2016-10-23 LAB — URINALYSIS, ROUTINE W REFLEX MICROSCOPIC
BILIRUBIN URINE: NEGATIVE
Hgb urine dipstick: NEGATIVE
KETONES UR: NEGATIVE
Leukocytes, UA: NEGATIVE
NITRITE: NEGATIVE
RBC / HPF: NONE SEEN (ref 0–?)
Total Protein, Urine: NEGATIVE
URINE GLUCOSE: NEGATIVE
UROBILINOGEN UA: 0.2 (ref 0.0–1.0)
WBC UA: NONE SEEN (ref 0–?)
pH: 6 (ref 5.0–8.0)

## 2016-10-23 LAB — SEDIMENTATION RATE: Sed Rate: 13 mm/hr (ref 0–30)

## 2016-10-23 NOTE — Patient Instructions (Addendum)
You will have labs checked today in the basement lab.  Please head down after you check out with the front desk  (stool for GI pathogen panel, ESR, CRP, UA with reflex culture, hepatitis B surface Antigen, Hepatitis B surface antibody, quant gold for TB).  Will contact your insurance company about stating humira at usual starter pack dosing and then 44m [place] every two weeks. Information faxed to [facility] to start humira.    Will plan that you stop your azathiaprine 2 weeks after your first dose of humira.  Please return to see Dr. JArdis Hughson 12/27/16 at 8:45 am

## 2016-10-23 NOTE — Progress Notes (Signed)
Review of gastrointestinal problems:  1.Crohn's ileocolitis:Workup at [facility].Marland KitchenMarland KitchenPromethius IBD Serology 7 testing 02/2008 "pattern consistent with IBD.Marland KitchenMarland KitchenCrohn's."Colonoscopy 10/2009showed terminal ileum and right colon ulcerations, pathology c/w acute and chronic ileitis, colitis, also HP polyps. Colonoscopy Path report from 2006 showed HP polyps. Office note 05/2009 documented previous C. diff colitis (treated empirically and also after toxin +). 2012 doing well on oral mesalamine 3.6 gms/day. ? Worsening of disease 03/2013, c. Diff PCR neg; increased mesalamine to 4.8gm daily. Colonoscopy 1/2023fund essentially normal colon but moderate to severe ileitis (path confirmed acute and chronic inflammation), started on 2045mazathiaprine daily Had erythema nodosum 2014, early, improved with steroids. 11/2012 doing very well on 200 azathiaprine daily. 04/2013: urgency, straining: felt to be more functional then IBD related; fiber supplements stopped. 12/2013 doing well on 200 azathiaprine daily. 03/2015 MRI enterograophy "mild wall thickening in TI and cecum...active crohn's disease", elevated inflammatory markers and so azathiaprine dose increased to 25072maily; Decreased back to 200m6mily due to blood counts, LFTs. 2. Dysphasia:EGD August, [id number]'s ring that was dilated to 20 mm, small hiatal hernia, tortuous esophagus. Swallowing much improved. March 2012 PPI only every other day. Repeat EGD 12/2014for dysphagia showed 4cn HH, tortuous distal esophagus, Schatzki's ring that was dilated to 20mm.7mThrombosed external hemorrhoid2016, this was around the time of constipation related to pain medicine after a knee surgery. Resolve with conservative therapy   HPI: This is a very pleasant 61 yea15old woman whom I last saw about 6 months ago.  hief complaint is Crohn's ileocolitis  Had abdominal pains, urgency, + very loose stools.  Nocturnal.  This lasted for about a week, started 4 weeks.   Still having loose stools. 3-4 every morning. Less urgency however. Her significant right sided abdominal pains have also improved. She was also having pains in her suprapubic region. Those have improved as well.   Nobody else was sick.  Over the winter she was on antibiotics for UGI, long ago however.  ROS: complete GI ROS as described in HPI, all other review negative.  Constitutional:  No unintentional weight loss   Past Medical History:  Diagnosis Date  . Crohn's disease (HCC)  [place]   . High cholesterol   . History of hiatal hernia   . Osteoarthritis of knee, unilateral 09/2014   right   . 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 Seasonal allergies     Past Surgical History:  Procedure Laterality Date  . APPENDECTOMY    . CESAREAN SECTION     x 3  . CHOLECYSTECTOMY    . COLONOSCOPY    . ESOPHAGOGASTRODUODENOSCOPY (EGD) WITH ESOPHAGEAL DILATION     x 2  . PATELLA-FEMORAL ARTHROPLASTY Right 10/07/2014   Procedure: RIGHT PATELLA-FEMORAL ARTHROPLASTY;  Surgeon: JoshuaMarchia Bond [facility];  Laterality: Right;  . TONSILLECTOMY AND ADENOIDECTOMY    . WISDOM TOOTH EXTRACTION      Current Outpatient Prescriptions  Medication Sig Dispense Refill  . aspirin 81 MG tablet Take 81 mg by mouth daily.    . azaTMarland KitchenIOprine (IMURAN) 50 MG tablet Take 4 tablets (200 mg total) by mouth daily. 120 tablet 11  . CALCIUM CITRATE PO Take 1 tablet by mouth 2 (two) times daily.    . Cholecalciferol (VITAMIN D-3 PO) Take 1 capsule by mouth at bedtime.    . Cyanocobalamin (VITAMIN B-12 PO) Take 1 tablet by mouth daily.    . fexofenadine (ALLEGRA) 180 MG  tablet Take 180 mg by mouth daily.    Marland Kitchen levocetirizine (XYZAL) 5 MG tablet Take 5 mg by mouth every evening.    . montelukast (SINGULAIR) 10 MG tablet Take 10 mg by mouth at bedtime.    . Multiple Vitamin (MULITIVITAMIN WITH MINERALS) TABS Take 1 tablet by mouth 2  (two) times daily.    . Omega-3 Fatty Acids (FISH OIL PO) Take 2 capsules by mouth 2 (two) times daily.     . pravastatin (PRAVACHOL) 20 MG tablet TAKE 1 TABLET(20 MG) BY MOUTH DAILY 90 tablet 0   No current facility-administered medications for this visit.     Allergies as of 10/23/2016  . (No Known Allergies)    Family History  Problem Relation Age of Onset  . Heart disease Father   . Hyperlipidemia Father   . Heart disease Brother 31       MI  . Arthritis Mother   . Dementia Mother   . Asthma Son   . Stroke Maternal Grandfather   . Parkinson's disease Brother   . Colon cancer Neg Hx   . Esophageal cancer Neg Hx   . Pancreatic cancer Neg Hx   . Stomach cancer Neg Hx   . Liver disease Neg Hx     Social History   Social History  . Marital status: Married    Spouse name: N/A  . Number of children: 3  . Years of education: N/A   Occupational History  . Retired Pharmacist, hospital    Social History Main Topics  . Smoking status: Never Smoker  . Smokeless tobacco: Never Used  . Alcohol use Yes     Comment: weekends  . Drug use: No  . Sexual activity: Yes    Birth control/ protection: Post-menopausal   Other Topics Concern  . Not on file   Social History Narrative  . No narrative on file     Physical Exam: BP 116/78   Pulse 92   Ht 5' 7.5" (1.715 m)   Wt 264 lb 12.8 oz (120.1 kg)   BMI 40.86 kg/m  Constitutional: generally well-appearing Psychiatric: alert and oriented x3 Abdomen: soft, nontender, nondistended, no obvious ascites, no peritoneal signs, normal bowel sounds No peripheral edema noted in lower extremities  Assessment and plan: 61 y.o. female witCrohn's ileocolitis  twice in the past 2 years have we been at this point where 200 mg of azathioprine daily is not seem to be keeping her in remission. I increased her to 250 mg of azathioprine last year with good clinical response however we had to decrease back because of LFT abnormalities. I do believe she  probably has flare going on. Going to order a battery of stool tests, blood tests, urinalysis to make sure there is no infectious etiology. I will also get inflammatory markers. If no clear infection that I think it is safe to say that she is having a Crohn's ileocolitis flare and that azathioprine at 200 mg per day which is her maximum tolerated dose is not holding her in remission. We discussed Biologics. IV infusion versus subcutaneous shots at home. She watches over her grandchildren much prefers the idea of subcutaneous home shots. We discussed immune compromise status, hematologic disorders, demyelinating complications. I told her that these were all described that are rare. We are going to work on getting her started on Humira at the usual starter dose with the idea of 40 mg every other week maintenance. She will overlap with her azathioprine for 2  weeks after her first Humira dosing. She'll return to see me in 6 weeks and sooner if needed. Obviously if any of the tests we are ordering today point more towards infection or lack of inflammation then she will not be started on Humira.  Please see the "Patient Instructions" section for addition details about the plan.  Owens Loffler, MD [facility] 10/23/2016, 9:19 AM

## 2016-10-24 ENCOUNTER — Other Ambulatory Visit: Payer: Managed Care, Other (non HMO)

## 2016-10-24 DIAGNOSIS — K50119 Crohn's disease of large intestine with unspecified complications: Secondary | ICD-10-CM

## 2016-10-24 LAB — HEPATITIS B SURFACE ANTIBODY,QUALITATIVE: Hep B S Ab: POSITIVE — AB

## 2016-10-24 LAB — HEPATITIS B SURFACE ANTIGEN: HEP B S AG: NEGATIVE

## 2016-10-25 LAB — QUANTIFERON TB GOLD ASSAY (BLOOD)
INTERFERON GAMMA RELEASE ASSAY: NEGATIVE
Mitogen-Nil: 5.72 IU/mL
QUANTIFERON NIL VALUE: 0.05 [IU]/mL
Quantiferon Tb Ag Minus Nil Value: <0 IU/mL

## 2016-10-25 LAB — HIGH SENSITIVITY CRP: CRP, High Sensitivity: 1.05 mg/L (ref 0.000–5.000)

## 2016-10-28 ENCOUNTER — Telehealth: Payer: Self-pay | Admitting: Gastroenterology

## 2016-10-28 LAB — GASTROINTESTINAL PATHOGEN PANEL PCR
C. DIFFICILE TOX A/B, PCR: NOT DETECTED
Campylobacter, PCR: NOT DETECTED
Cryptosporidium, PCR: NOT DETECTED
E COLI (ETEC) LT/ST, PCR: NOT DETECTED
E COLI 0157, PCR: NOT DETECTED
E coli (STEC) stx1/stx2, PCR: NOT DETECTED
GIARDIA LAMBLIA, PCR: NOT DETECTED
Norovirus, PCR: NOT DETECTED
Rotavirus A, PCR: NOT DETECTED
SALMONELLA, PCR: NOT DETECTED
Shigella, PCR: NOT DETECTED

## 2016-10-28 NOTE — Telephone Encounter (Signed)
Records faxed as requested to [phone]

## 2016-10-30 ENCOUNTER — Telehealth: Payer: Self-pay | Admitting: Gastroenterology

## 2016-10-30 NOTE — Telephone Encounter (Signed)
The pt has been advised that [facility] has the info and will be called as soon as [facility] gets insurance approval

## 2016-11-01 ENCOUNTER — Telehealth: Payer: Self-pay | Admitting: Gastroenterology

## 2016-11-01 NOTE — Telephone Encounter (Signed)
Pharmacy called to state they could not bill pt secondary ins for medication humira so they are sending [facility] to other [facility], and they will contact the patient. FYI

## 2016-11-01 NOTE — Telephone Encounter (Signed)
Noted  

## 2016-12-27 ENCOUNTER — Encounter: Payer: Self-pay | Admitting: Gastroenterology

## 2016-12-27 ENCOUNTER — Other Ambulatory Visit (INDEPENDENT_AMBULATORY_CARE_PROVIDER_SITE_OTHER): Payer: Managed Care, Other (non HMO)

## 2016-12-27 ENCOUNTER — Ambulatory Visit (INDEPENDENT_AMBULATORY_CARE_PROVIDER_SITE_OTHER): Payer: Managed Care, Other (non HMO) | Admitting: Gastroenterology

## 2016-12-27 VITALS — BP 136/102 | HR 80 | Ht 67.0 in | Wt 271.1 lb

## 2016-12-27 DIAGNOSIS — R197 Diarrhea, unspecified: Secondary | ICD-10-CM

## 2016-12-27 DIAGNOSIS — K50119 Crohn's disease of large intestine with unspecified complications: Secondary | ICD-10-CM

## 2016-12-27 LAB — CBC WITH DIFFERENTIAL/PLATELET
BASOS ABS: 0 10*3/uL (ref 0.0–0.1)
Basophils Relative: 1.2 % (ref 0.0–3.0)
EOS ABS: 0.1 10*3/uL (ref 0.0–0.7)
Eosinophils Relative: 2.5 % (ref 0.0–5.0)
HCT: 39.8 % (ref 36.0–46.0)
Hemoglobin: 13.2 g/dL (ref 12.0–15.0)
LYMPHS ABS: 0.8 10*3/uL (ref 0.7–4.0)
Lymphocytes Relative: 30.6 % (ref 12.0–46.0)
MCHC: 33.2 g/dL (ref 30.0–36.0)
MCV: 98.2 fl (ref 78.0–100.0)
MONO ABS: 0.4 10*3/uL (ref 0.1–1.0)
MONOS PCT: 15.6 % — AB (ref 3.0–12.0)
NEUTROS PCT: 50.1 % (ref 43.0–77.0)
Neutro Abs: 1.2 10*3/uL — ABNORMAL LOW (ref 1.4–7.7)
Platelets: 311 10*3/uL (ref 150.0–400.0)
RBC: 4.05 Mil/uL (ref 3.87–5.11)
RDW: 14.9 % (ref 11.5–15.5)
WBC: 2.5 10*3/uL — ABNORMAL LOW (ref 4.0–10.5)

## 2016-12-27 LAB — COMPREHENSIVE METABOLIC PANEL
ALK PHOS: 48 U/L (ref 39–117)
ALT: 27 U/L (ref 0–35)
AST: 31 U/L (ref 0–37)
Albumin: 4 g/dL (ref 3.5–5.2)
BILIRUBIN TOTAL: 0.4 mg/dL (ref 0.2–1.2)
BUN: 17 mg/dL (ref 6–23)
CO2: 26 meq/L (ref 19–32)
CREATININE: 1 mg/dL (ref 0.40–1.20)
Calcium: 10.2 mg/dL (ref 8.4–10.5)
Chloride: 107 mEq/L (ref 96–112)
GFR: 59.67 mL/min — ABNORMAL LOW (ref >60.00)
GLUCOSE: 96 mg/dL (ref 70–99)
Potassium: 4.8 mEq/L (ref 3.5–5.1)
SODIUM: 141 meq/L (ref 135–145)
TOTAL PROTEIN: 7.3 g/dL (ref 6.0–8.3)

## 2016-12-27 LAB — URINALYSIS
BILIRUBIN URINE: NEGATIVE
HGB URINE DIPSTICK: NEGATIVE
KETONES UR: NEGATIVE
LEUKOCYTES UA: NEGATIVE
NITRITE: NEGATIVE
Specific Gravity, Urine: 1.02 (ref 1.000–1.030)
TOTAL PROTEIN, URINE-UPE24: NEGATIVE
UROBILINOGEN UA: 0.2 (ref 0.0–1.0)
Urine Glucose: NEGATIVE
pH: 7 (ref 5.0–8.0)

## 2016-12-27 MED ORDER — ADALIMUMAB 40 MG/0.8ML ~~LOC~~ PSKT: 40.0000 mg | PREFILLED_SYRINGE | SUBCUTANEOUS | 3 refills | Status: DC

## 2016-12-27 NOTE — Progress Notes (Signed)
Review of gastrointestinal problems:  1.Crohn's ileocolitis:Workup at [facility].Marland KitchenMarland KitchenPromethius IBD Serology 7 testing 02/2008 "pattern consistent with IBD.Marland KitchenMarland KitchenCrohn's."Colonoscopy 10/2009showed terminal ileum and right colon ulcerations, pathology c/w acute and chronic ileitis, colitis, also HP polyps. Colonoscopy Path report from 2006 showed HP polyps. Office note 05/2009 documented previous C. diff colitis (treated empirically and also after toxin +). 2012 doing well on oral mesalamine 3.6 gms/day. ? Worsening of disease 03/2013, c. Diff PCR neg; increased mesalamine to 4.8gm daily. Colonoscopy 1/2062fund essentially normal colon but moderate to severe ileitis (path confirmed acute and chronic inflammation), started on 2011mazathiaprine daily Had erythema nodosum 2014, early, improved with steroids. 11/2012 doing very well on 200 azathiaprine daily. 04/2013: urgency, straining: felt to be more functional then IBD related; fiber supplements stopped. 12/2013 doing well on 200 azathiaprine daily. 03/2015 MRI enterograophy "mild wall thickening in TI and cecum...active crohn's disease", elevated inflammatory markers and so azathiaprine dose increased to 25031maily; Decreased back to 200m44mily due to blood counts, LFTs.  Started humira 11/2016  Labs 10/2016: TB quant gold negative, hep B S Ag negative, Hep B S Ab positive 2. Dysphasia:EGD August, [id number]'s ring that was dilated to 20 mm, small hiatal hernia, tortuous esophagus. Swallowing much improved. March 2012 PPI only every other day. Repeat EGD 12/2014for dysphagia showed 4cn HH, tortuous distal esophagus, Schatzki's ring that was dilated to 20mm.17mThrombosed external hemorrhoid2016, this was around the time of constipation related to pain medicine after a knee surgery. Resolve with conservative therapy   HPI: This is a very pleasant 61 yea22old woman whom I last saw about 2 months ago. At that visit we decided to advance her to  Biologics, Humira.  She did 160 mg daily 1, 80 mg 2 weeks later and another 80 mg 2 weeks after that.  She says she overall feels worse since changing to Humira. She stopped her Imuran about 3 weeks ago.  She is having intermittent diarrhea, persistent central low abdominal pain, really suprapubic or most. Intermittent left flank pains. No fevers or chills. No overt GI bleeding.  Chief complaint is Crohn's ileocolitis  Started humira: 4 injections, then 2 injection, then 2 more injections just a couple days ago.  Stopped imuran 3 weeks ago  Feeling bloated, gaining weight.   Lower, suprapubic abd pains.  This AM normal BM   ROS: complete GI ROS as described in HPI, all other review negative.  Constitutional:  No unintentional weight loss   Past Medical History:  Diagnosis Date  . Crohn's disease (HCC)  [place]   . High cholesterol   . History of hiatal hernia   . Osteoarthritis of knee, unilateral 09/2014   right   . 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 Seasonal allergies     Past Surgical History:  Procedure Laterality Date  . APPENDECTOMY    . CESAREAN SECTION     x 3  . CHOLECYSTECTOMY    . COLONOSCOPY    . ESOPHAGOGASTRODUODENOSCOPY (EGD) WITH ESOPHAGEAL DILATION     x 2  . PATELLA-FEMORAL ARTHROPLASTY Right 10/07/2014   Procedure: RIGHT PATELLA-FEMORAL ARTHROPLASTY;  Surgeon: JoshuaMarchia Bond [facility];  Laterality: Right;  . TONSILLECTOMY AND ADENOIDECTOMY    . WISDOM TOOTH EXTRACTION      Current Outpatient Prescriptions  Medication Sig Dispense Refill  . Adalimumab (HUMIRA) 40 MG/0.8ML PSKT Inject 80 mg into the skin every 14 (fourteen) days.    .Marland Kitchen  aspirin 81 MG tablet Take 81 mg by mouth daily.    Marland Kitchen CALCIUM CITRATE PO Take 1 tablet by mouth 2 (two) times daily.    . Cholecalciferol (VITAMIN D-3 PO) Take 1 capsule by mouth at bedtime.    . Cyanocobalamin (VITAMIN B-12 PO)  Take 1 tablet by mouth daily.    Marland Kitchen DYMISTA 137-50 MCG/ACT SUSP Place 1 spray into the nose as needed.  6  . fexofenadine (ALLEGRA) 180 MG tablet Take 180 mg by mouth daily.    Marland Kitchen levocetirizine (XYZAL) 5 MG tablet Take 5 mg by mouth every evening.    . montelukast (SINGULAIR) 10 MG tablet Take 10 mg by mouth at bedtime.    . Multiple Vitamin (MULITIVITAMIN WITH MINERALS) TABS Take 1 tablet by mouth 2 (two) times daily.    . Omega-3 Fatty Acids (FISH OIL PO) Take 2 capsules by mouth 2 (two) times daily.     . pravastatin (PRAVACHOL) 20 MG tablet TAKE 1 TABLET(20 MG) BY MOUTH DAILY 90 tablet 0   No current facility-administered medications for this visit.     Allergies as of 12/27/2016  . (No Known Allergies)    Family History  Problem Relation Age of Onset  . Heart disease Father   . Hyperlipidemia Father   . Heart disease Brother 3       MI  . Arthritis Mother   . Dementia Mother   . Asthma Son   . Stroke Maternal Grandfather   . Parkinson's disease Brother   . Colon cancer Neg Hx   . Esophageal cancer Neg Hx   . Pancreatic cancer Neg Hx   . Stomach cancer Neg Hx   . Liver disease Neg Hx     Social History   Social History  . Marital status: Married    Spouse name: N/A  . Number of children: 3  . Years of education: N/A   Occupational History  . Retired Pharmacist, hospital    Social History Main Topics  . Smoking status: Never Smoker  . Smokeless tobacco: Never Used  . Alcohol use Yes     Comment: weekends  . Drug use: No  . Sexual activity: Yes    Birth control/ protection: Post-menopausal   Other Topics Concern  . Not on file   Social History Narrative  . No narrative on file     Physical Exam: BP (!) 136/102 (BP Location: Left Arm, Patient Position: Sitting, Cuff Size: Large)   Pulse 80   Ht 5' 7"  (1.702 m)   Wt 271 lb 2 oz (123 kg)   BMI 42.46 kg/m  Constitutional: generally well-appearing Psychiatric: alert and oriented x3 Abdomen: soft, Only very  mildly tender suprapubic, nondistended, no obvious ascites, no peritoneal signs, normal bowel sounds No peripheral edema noted in lower extremities  Assessment and plan: 61 y.o. female with Crohn's ileocolitis recently advanced to Humira  She is overall feeling worse since stopping immunomodulators and starting Humira. She has some low abdominal pains and a little left flank pain concerned about possible urinary tract infection. She is also having intermittent significant loose stools. No overt GI bleeding. She has been gaining weight. I also think she took twice the usual maintenance dose this past weekend. She was sent to  By the drug company and took them both. I've instructed her that she should be taking 40 mg of Humira every other week and we will check with her drug company to make sure that is what they are  delivering she will get a set of labs including a CBC, complete metabolic profile, urinalysis, urine culture and stool for Clostridium difficile testing. If the lab work failed to show clear etiology for her failure to improve and, general downturn, and I would like to proceed with colonoscopy. Her last one was about 4 years ago.  Please see the "Patient Instructions" section for addition details about the plan.  Owens Loffler, MD [facility] 12/27/2016, 8:47 AM

## 2016-12-27 NOTE — Patient Instructions (Addendum)
You should be taking 32m humira every other week.  You will have labs checked today in the basement lab.  Please head down after you check out with the front desk  (UA and urine culture, stool for C. Diff by PCR and toxin, cbc, cmet).  Normal BMI (Body Mass Index- based on height and weight) is between 19 and 25. Your BMI today is Body mass index is 42.46 kg/m. .Marland KitchenPlease consider follow up  regarding your BMI with your Primary Care Provider.

## 2016-12-28 LAB — URINE CULTURE
MICRO NUMBER:: 81017279
SPECIMEN QUALITY:: ADEQUATE

## 2016-12-28 LAB — CLOSTRIDIUM DIFFICILE BY PCR: CDIFFPCR: DETECTED — AB

## 2016-12-30 ENCOUNTER — Telehealth: Payer: Self-pay

## 2016-12-30 NOTE — Telephone Encounter (Signed)
-----   Message from Milus Banister, MD sent at 12/30/2016  8:15 AM EDT ----- Dr. Lacinda Axon, Sorry that the result was sent to you.  I'm not sure why that happened. I actually saw the results and was having my team get in touch with her this AM as well.  Jazlene Bares, Can you make sure Lequisha is on the vancomycin 133m po QID for 2 weeks.  Can you check with epic as to why Dr. CLacinda Axonwas sent the results.  Thanks  ----- Message ----- From: CCoral Spikes DO Sent: 12/30/2016   8:10 AM To: DMilus Banister MD  C diff positive (over the weekend). I received results and start on PO Vanc.  Just wanted to let you know  Take care  Dr. CLacinda Axon

## 2016-12-30 NOTE — Telephone Encounter (Signed)
Dr Ardis Hughs I have sent this to Senaida Ores to see what happened.

## 2017-01-06 ENCOUNTER — Telehealth: Payer: Self-pay | Admitting: Gastroenterology

## 2017-01-06 NOTE — Telephone Encounter (Signed)
The pt states she is doing better the stools are becoming less frequent and she still have a few days left of the vanc.  She will call me back if she does not continue to improve.  Her humira is due in 2 weeks, is she ok to resume?

## 2017-01-07 NOTE — Telephone Encounter (Signed)
Good to hear.  Yes, she should continue with plans for humira (52m) every other week. thanks

## 2017-01-07 NOTE — Telephone Encounter (Signed)
Pt aware to continue humira

## 2017-02-07 ENCOUNTER — Ambulatory Visit (INDEPENDENT_AMBULATORY_CARE_PROVIDER_SITE_OTHER): Payer: Managed Care, Other (non HMO) | Admitting: Family Medicine

## 2017-02-07 ENCOUNTER — Encounter: Payer: Self-pay | Admitting: Family Medicine

## 2017-02-07 VITALS — BP 120/82 | HR 76 | Wt 272.2 lb

## 2017-02-07 DIAGNOSIS — E7800 Pure hypercholesterolemia, unspecified: Secondary | ICD-10-CM | POA: Diagnosis not present

## 2017-02-07 NOTE — Progress Notes (Signed)
   Subjective:    Patient ID: Connie Porter, female    DOB: [birth year], 61 y.o.   MRN: [id number]  HPI Chief Complaint  Patient presents with  . fasting med check    fasting med check   She is here for a fasting med check. She is taking pravastatin for elevated LDL. Last lipid panel was 12/2015.  This is the only statin she has ever taken. No issues with the medication and she reports good compliance.  Diet is fairly unhealthy. States she does not drink anything with calories in it most days.   States she cannot walk for exercise due to foot and knee pain. She is seeing Dr. Mardelle Matte for this. She has not tried other forms of exercise. States she would like to lose weight.   Her last CPE was in 2016. States she does not want a CPE.  States she is seeing her GI every couple of months for crohns and gets blood work regularly except for cholesterol.   Dr. Ardis Hughs- Crohns- sees him regularly for Crohns disease.  [facility] Saw him in May 2018 for pelvic and mammogram.  Dr. Mardelle Matte - orthopedist.  Dr. Donneta Romberg- allergies, fall- ragweed, [place] sublingual.    Review of Systems Pertinent positives and negatives in the history of present illness.     Objective:   Physical Exam BP 120/82   Pulse 76   Wt 272 lb 3.2 oz (123.5 kg)   BMI 42.63 kg/m   Alert and oriented and in acute distress. Not otherwise examined.       Assessment & Plan:  Pure hypercholesterolemia - Plan: Lipid panel  Morbid obesity ([place])  Fasting lipids checked. Adjust medication as needed.  Counseled on morbid obesity. Recommend she try My Fitness Pal to track calories and nutrition. Start doing exercise other than walking such as biking, rowing, swimming or anything non weight bearing.  She appears to be up to date on health maintenance and will continue seeing her OB/GYN and GI as needed. Does not want a CPE.  Seasonal allergies managed by allergist.  Follow up pending labs.

## 2017-02-07 NOTE — Patient Instructions (Addendum)
Use a free app such as My Fitness Pal to track your daily caloric and nutrition intake.   Start doing non weight bearing exercises such as rowing, swimming or even biking. These would be better for your foot and knee.   We will call you with your lab results.

## 2017-02-08 LAB — LIPID PANEL
CHOL/HDL RATIO: 2.2 (calc) (ref <5.0)
CHOLESTEROL: 208 mg/dL — AB (ref <200)
HDL: 96 mg/dL (ref >50)
LDL CHOLESTEROL (CALC): 95 mg/dL
Non-HDL Cholesterol (Calc): 112 mg/dL (calc) (ref <130)
Triglycerides: 79 mg/dL (ref <150)

## 2017-02-10 ENCOUNTER — Other Ambulatory Visit: Payer: Self-pay | Admitting: Family Medicine

## 2017-02-10 MED ORDER — PRAVASTATIN SODIUM 20 MG PO TABS: ORAL_TABLET | ORAL | 1 refills | Status: DC

## 2017-03-03 ENCOUNTER — Ambulatory Visit: Payer: Managed Care, Other (non HMO) | Admitting: Gastroenterology

## 2017-03-12 ENCOUNTER — Encounter: Payer: Self-pay | Admitting: Gastroenterology

## 2017-03-12 ENCOUNTER — Ambulatory Visit (INDEPENDENT_AMBULATORY_CARE_PROVIDER_SITE_OTHER): Payer: Managed Care, Other (non HMO) | Admitting: Gastroenterology

## 2017-03-12 VITALS — BP 132/80 | HR 88 | Ht 67.5 in | Wt 273.8 lb

## 2017-03-12 DIAGNOSIS — K50119 Crohn's disease of large intestine with unspecified complications: Secondary | ICD-10-CM

## 2017-03-12 NOTE — Patient Instructions (Signed)
Please return to see Dr. Ardis Hughs in 3-4 months. Continue on humira 50m every other week.

## 2017-03-12 NOTE — Progress Notes (Signed)
Review of gastrointestinal problems:  1.Crohn's ileocolitis:Workup at [facility] testing 02/2008 "pattern consistent with IBD.Marland KitchenMarland KitchenCrohn's."Colonoscopy 10/2009showed terminal ileum and right colon ulcerations, pathology c/w acute and chronic ileitis, colitis, also HP polyps. Colonoscopy Path report from 2006 showed HP polyps. Office note 05/2009 documented previous C. diff colitis (treated empirically and also after toxin +). 2012 doing well on oral mesalamine 3.6 gms/day. ? Worsening of disease 03/2013, c. Diff PCR neg; increased mesalamine to 4.8gm daily. Colonoscopy 1/2055fund essentially normal colon but moderate to severe ileitis (path confirmed acute and chronic inflammation), started on 2063mazathiaprine daily Had erythema nodosum 2014, early, improved with steroids. 11/2012 doing very well on 200 azathiaprine daily. 04/2013: urgency, straining: felt to be more functional then IBD related; fiber supplements stopped. 12/2013 doing well on 200 azathiaprine daily. 03/2015 MRI enterograophy"mild wall thickening in TI and cecum...active crohn's disease", elevated inflammatory markers and so azathiaprine dose increased to 2506maily; Decreased back to 200m33mily due to blood counts, LFTs.  Started humira 11/2016  Labs 10/2016: TB quant gold negative, hep B S Ag negative, Hep B S Ab positive 2. Dysphasia:EGD August, [id number]'s ring that was dilated to 20 mm, small hiatal hernia, tortuous esophagus. Swallowing much improved. March 2012 PPI only every other day. Repeat EGD 12/2014for dysphagia showed 4cn HH, tortuous distal esophagus, Schatzki's ring that was dilated to 20mm.18mThrombosed external hemorrhoid2016, this was around the time of constipation related to pain medicine after a knee surgery. Resolve with conservative therapy 4. C. Diff + PCR 12/2016; diarrhea; treated with 2 week oral vanc course; diarrhea significantly improved after the  antibiotics.  HPI: This is a very pleasant 61 yea17old woman with Crohn's ileocolitis  At her last visit she was really feeling pretty terribly more frequent than usual stools.  Is concerned that possibly she was having a reaction to Humira which was relatively recently started for her.  She is also taken twice the usual maintenance dose inadvertently.  I think her symptoms were all actually from Clostridium difficile infection which was proven by stool testing.  She feels much better after vancomycin oral course for 2 weeks.  Bowels are normal: two soft, formed BMs in AM.  No bleeding.  No abd pains.  Crohn's ileocolitis  Currently on humira 40mg e41m other weeks.  Chief complaint is Crohn's ileocolitis  ROS: complete GI ROS as described in HPI, all other review negative.  Constitutional:  No unintentional weight loss   Past Medical History:  Diagnosis Date  . Crohn's disease (HCC)   Fraserental crowns present   . High cholesterol   . History of hiatal hernia   . Osteoarthritis of knee, unilateral 09/2014   right   . 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 Seasonal allergies     Past Surgical History:  Procedure Laterality Date  . APPENDECTOMY    . CESAREAN SECTION     x 3  . CHOLECYSTECTOMY    . COLONOSCOPY    . ESOPHAGOGASTRODUODENOSCOPY (EGD) WITH ESOPHAGEAL DILATION     x 2  . PATELLA-FEMORAL ARTHROPLASTY Right 10/07/2014   Procedure: RIGHT PATELLA-FEMORAL ARTHROPLASTY;  Surgeon: Joshua Marchia BondLocation: [facility]: [facility]ics;  Laterality: Right;  . TONSILLECTOMY AND ADENOIDECTOMY    . WISDOM TOOTH EXTRACTION      Current Outpatient Medications  Medication Sig Dispense Refill  . aspirin 81 MG tablet Take 81 mg by mouth daily.    . CALCIMarland KitchenM CITRATE PO Take  1 tablet by mouth 2 (two) times daily.    . Cholecalciferol (VITAMIN D-3 PO) Take 1 capsule by mouth at bedtime.    . Cyanocobalamin (VITAMIN B-12 PO) Take 1 tablet by  mouth daily.    Marland Kitchen DYMISTA 137-50 MCG/ACT SUSP Place 1 spray into the nose as needed.  6  . fexofenadine (ALLEGRA) 180 MG tablet Take 180 mg by mouth daily.    Marland Kitchen levocetirizine (XYZAL) 5 MG tablet Take 5 mg by mouth every evening.    . montelukast (SINGULAIR) 10 MG tablet Take 10 mg by mouth at bedtime.    . Multiple Vitamin (MULITIVITAMIN WITH MINERALS) TABS Take 1 tablet by mouth 2 (two) times daily.    . Omega-3 Fatty Acids (FISH OIL PO) Take 2 capsules by mouth 2 (two) times daily.     . pravastatin (PRAVACHOL) 20 MG tablet TAKE 1 TABLET(20 MG) BY MOUTH DAILY 90 tablet 1  . Adalimumab (HUMIRA) 40 MG/0.8ML PSKT Inject 0.8 mLs (40 mg total) into the skin every 14 (fourteen) days. 2 each 3   No current facility-administered medications for this visit.     Allergies as of 03/12/2017  . (No Known Allergies)    Family History  Problem Relation Age of Onset  . Heart disease Father   . Hyperlipidemia Father   . Heart disease Brother 19       MI  . Arthritis Mother   . Dementia Mother   . Asthma Son   . Stroke Maternal Grandfather   . Parkinson's disease Brother   . Colon cancer Neg Hx   . Esophageal cancer Neg Hx   . Pancreatic cancer Neg Hx   . Stomach cancer Neg Hx   . Liver disease Neg Hx     Social History   Socioeconomic History  . Marital status: Married    Spouse name: Not on file  . Number of children: 3  . Years of education: Not on file  . Highest education level: Not on file  Social Needs  . Financial resource strain: Not on file  . Food insecurity - worry: Not on file  . Food insecurity - inability: Not on file  . Transportation needs - medical: Not on file  . Transportation needs - non-medical: Not on file  Occupational History  . Occupation: Retired Pharmacist, hospital  Tobacco Use  . Smoking status: Never Smoker  . Smokeless tobacco: Never Used  Substance and Sexual Activity  . Alcohol use: Yes    Comment: weekends  . Drug use: No  . Sexual activity: Yes     Birth control/protection: Post-menopausal  Other Topics Concern  . Not on file  Social History Narrative  . Not on file     Physical Exam: BP (!) 136/102   Pulse 88   Ht 5' 7.5" (1.715 m)   Wt 273 lb 12.8 oz (124.2 kg)   BMI 42.25 kg/m  Constitutional: generally well-appearing Psychiatric: alert and oriented x3 Abdomen: soft, nontender, nondistended, no obvious ascites, no peritoneal signs, normal bowel sounds No peripheral edema noted in lower extremities  Assessment and plan: 61 y.o. female with, recently complicated by Clostridium difficile diarrhea  She feels much better after 2-week vancomycin course.  She is on Humira 40 mg every other week and she will continue on that regimen.  She will return to see me in 3-4 months and sooner if needed.  Please see the "Patient Instructions" section for addition details about the plan.  Owens Loffler, MD [facility] 03/12/2017, 9:38 AM

## 2017-03-26 ENCOUNTER — Other Ambulatory Visit: Payer: Self-pay

## 2017-03-26 MED ORDER — ADALIMUMAB 40 MG/0.8ML ~~LOC~~ PSKT
40.0000 mg | PREFILLED_SYRINGE | SUBCUTANEOUS | 3 refills | Status: DC
Start: 2017-03-26 — End: 2017-04-09

## 2017-03-31 ENCOUNTER — Ambulatory Visit (INDEPENDENT_AMBULATORY_CARE_PROVIDER_SITE_OTHER): Payer: Managed Care, Other (non HMO) | Admitting: Family Medicine

## 2017-03-31 ENCOUNTER — Encounter: Payer: Self-pay | Admitting: Family Medicine

## 2017-03-31 VITALS — BP 140/92 | HR 83 | Temp 98.5°F | Resp 16 | Ht 67.5 in | Wt 277.0 lb

## 2017-03-31 DIAGNOSIS — R030 Elevated blood-pressure reading, without diagnosis of hypertension: Secondary | ICD-10-CM

## 2017-03-31 DIAGNOSIS — M7989 Other specified soft tissue disorders: Secondary | ICD-10-CM | POA: Diagnosis not present

## 2017-03-31 DIAGNOSIS — M79661 Pain in right lower leg: Secondary | ICD-10-CM | POA: Diagnosis not present

## 2017-03-31 NOTE — Progress Notes (Signed)
   Subjective:    Patient ID: Connie Porter, female    DOB: 08/01/1955, 61 y.o.   MRN: [id number]  HPI Chief Complaint  Patient presents with  . Leg Swelling    and pain of right calf down to ankle x 2 weeks. Has been alternating ice and heat without any relief as well as taking IBU. Calf area extremely painful and get "tight" and tingly at times. Aches all the time.    She is here with complaints of right calf pain and swelling for the past 2 weeks. Denies injury. Pain is non radiating, worse with ambulation and improved with rest and elevation but does not go away completely.   No recent surgery, immobilization, travel.  No hormone therapy.  Denies personal or family history of DVT or bleeding disorder.   Denies fever, chills, chest pain, shortness of breath, cough, abdominal pain, N/V/D.   Reviewed allergies, medications, past medical, surgical, family, and social history.   Review of Systems Pertinent positives and negatives in the history of present illness.     Objective:   Physical Exam BP (!) 140/92   Pulse 83   Temp 98.5 F (36.9 C) (Tympanic)   Resp 16   Ht 5' 7.5" (1.715 m)   Wt 277 lb (125.6 kg)   SpO2 98%   BMI 42.74 kg/m   Alert and in no distress. Pharyngeal area is normal. Neck is supple without adenopathy or thyromegaly. Cardiac exam shows a regular sinus rhythm without murmurs or gallops. Lungs are clear to auscultation. Normal work of breathing. RLE with non pitting edema in foot, ankle and calf. Right calf measures 17 3/4 inches compared to left calf 17 inches. Right calf TTP. Normal color, cap refill, sensation, ROM and strength. Palpable pedal pulses. Positive Homan's sign.      Assessment & Plan:  Right calf pain - Plan: VAS [place] LOWER EXTREMITY VENOUS (DVT)  Pain and swelling of right lower leg - Plan: VAS [place] LOWER EXTREMITY VENOUS (DVT)  Elevated blood pressure reading without diagnosis of hypertension  Plan to send her for RLE doppler to rule out  DVT. Suspicion for PE is low, but we did discuss this. She is not have any cardiopulmonary symptoms.  No obvious explanation if she does have a DVT.  She is aware that her blood pressure is elevated today denies history of hypertension.  Discussed that this may be situational and pain related.  She will keep an eye on her blood pressures outside of here and report back any consistently elevated readings.  Follow-up pending ultrasound result.

## 2017-03-31 NOTE — Patient Instructions (Signed)
Your blood pressure is elevated today. 140/92.  I recommend that you check your blood pressures outside of here and if it is continuing to be elevated then you should be seen for this.  Goal blood pressure is 130/80 or lower.  You have an appointment tomorrow at 10 am at [facility] for an ultrasound of your right lower leg.  We will call you with your results.

## 2017-04-01 ENCOUNTER — Telehealth: Payer: Self-pay

## 2017-04-01 ENCOUNTER — Ambulatory Visit (HOSPITAL_COMMUNITY)
Admission: RE | Admit: 2017-04-01 | Discharge: 2017-04-01 | Disposition: A | Discharge status: Home / Self Care | Payer: Managed Care, Other (non HMO) | Source: Ambulatory Visit | Attending: Family Medicine | Admitting: Family Medicine

## 2017-04-01 DIAGNOSIS — M7989 Other specified soft tissue disorders: Secondary | ICD-10-CM | POA: Diagnosis not present

## 2017-04-01 DIAGNOSIS — M660 Rupture of popliteal cyst: Secondary | ICD-10-CM | POA: Diagnosis not present

## 2017-04-01 DIAGNOSIS — M79661 Pain in right lower leg: Secondary | ICD-10-CM | POA: Diagnosis not present

## 2017-04-01 NOTE — Telephone Encounter (Signed)
Vascular called, stated they did the lower right duplex; stated patient was negative for DVT, but she did have a ruptured Bakers Cyst (very large) in the calf area where pain is located.   I will call patient and notify her to add heat, and to continue taking anti-inflammatory medications, and if things do not get better within a week to let us know she may need to be referred to ortho.  Patient was contacted.

## 2017-04-01 NOTE — Progress Notes (Signed)
Right lower extremity venous duplex has been completed. Negative for DVT. There is evidence of a what appears to be a ruptured bakers cyst that has drained into the medial aspect of the right calf.  Results were given to [place] at [facility].   04/01/17 10:08 AM Connie Porter RVT

## 2017-04-04 ENCOUNTER — Telehealth: Payer: Self-pay | Admitting: Gastroenterology

## 2017-04-04 NOTE — Telephone Encounter (Signed)
rx clarified should be PEN Humira and not pre-filled syringes

## 2017-04-09 ENCOUNTER — Other Ambulatory Visit: Payer: Self-pay

## 2017-04-09 MED ORDER — ADALIMUMAB 40 MG/0.8ML ~~LOC~~ PSKT: 40.0000 mg | PREFILLED_SYRINGE | SUBCUTANEOUS | 3 refills | Status: DC

## 2017-05-05 ENCOUNTER — Ambulatory Visit (INDEPENDENT_AMBULATORY_CARE_PROVIDER_SITE_OTHER): Payer: Managed Care, Other (non HMO) | Admitting: Family Medicine

## 2017-05-05 ENCOUNTER — Telehealth: Payer: Self-pay | Admitting: Gastroenterology

## 2017-05-05 ENCOUNTER — Encounter: Payer: Self-pay | Admitting: Family Medicine

## 2017-05-05 VITALS — BP 132/86 | HR 88 | Temp 98.1°F | Ht 67.5 in | Wt 266.0 lb

## 2017-05-05 DIAGNOSIS — J019 Acute sinusitis, unspecified: Secondary | ICD-10-CM

## 2017-05-05 DIAGNOSIS — R05 Cough: Secondary | ICD-10-CM | POA: Diagnosis not present

## 2017-05-05 DIAGNOSIS — R059 Cough, unspecified: Secondary | ICD-10-CM

## 2017-05-05 MED ORDER — BENZONATATE 200 MG PO CAPS: 200.0000 mg | ORAL_CAPSULE | Freq: Three times a day (TID) | ORAL | 0 refills | Status: DC | PRN

## 2017-05-05 MED ORDER — AMOXICILLIN 500 MG PO TABS: 1000.0000 mg | ORAL_TABLET | Freq: Two times a day (BID) | ORAL | 0 refills | Status: DC

## 2017-05-05 NOTE — Patient Instructions (Signed)
  Continue to drink plenty of water. Reduce use of neti-pot to just once daily (since you aren't getting much out related to the infection) We recommend using distilled water or boiling water to help prevent any infections related to using Neti-pot. Continue the Mucinex-D twice daily.  Take the amoxil 2 pills twice daily for 10 days to treat your sinus infection.  If you are continuing to get worse rather than better (mucus getting darker and more discolored, worsening sinus pain, fever), then we may need to switch antibiotics.  I believe you are supposed to stop taking your Humira while being treated for an infection.

## 2017-05-05 NOTE — Progress Notes (Signed)
Chief Complaint  Patient presents with  . Cough    x 2 weeks. Has facial pain pressure. Teeth hurt and her ears hurt. Mucus is greenish/yellow from her nose. Cough is not real productive-but she is not sleeping real well. Unsure if she is having any fevers. No chills or body aches.     2 weeks ago she started with congestion, postnasal drainage and cough.  Within the last week, it is in her head, causing sinus headaches.  Nasal drainage is yellowish (changed from clear in the last few days).  Whole face hurts--forehead, cheeks, teeth pain. She has pressure in both ears, popping/plugging, not painful.  Coughing--worse at night.  Often dry, sometimes productive of yellow phlegm, slightly thick.  Feels it in her throat, not her chest.  Denies shortness of breath, DOE. She has been taking Mucinex-D twice daily (switched from the DM to the D when it moved back up into her head), along with her allergy medications. She usually does Neti-pot once daily, increased to BID with this illness.  Causing some discomfort and burning in her sinuses.  She really isn't getting that much drainage out.  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  Last treated with z-pak 1 year ago. She needed an ABX for some GI infection more recently.  PMH, PSH, SH reviewed  Outpatient Encounter Medications as of 05/05/2017  Medication Sig  . Adalimumab (HUMIRA) 40 MG/0.8ML PSKT Inject 0.8 mLs (40 mg total) into the skin every 14 (fourteen) days.  Marland Kitchen aspirin 81 MG tablet Take 81 mg by mouth daily.  Marland Kitchen CALCIUM CITRATE PO Take 1 tablet by mouth 2 (two) times daily.  . Cholecalciferol (VITAMIN D-3 PO) Take 1 capsule by mouth at bedtime.  . Cyanocobalamin (VITAMIN B-12 PO) Take 1 tablet by mouth daily.  Marland Kitchen DYMISTA 137-50 MCG/ACT SUSP Place 1 spray into the nose as needed.  . fexofenadine (ALLEGRA) 180 MG tablet Take 180 mg by mouth daily.  Marland Kitchen levocetirizine (XYZAL) 5 MG tablet Take 5 mg by mouth every  evening.  . montelukast (SINGULAIR) 10 MG tablet Take 10 mg by mouth at bedtime.  . Multiple Vitamin (MULITIVITAMIN WITH MINERALS) TABS Take 1 tablet by mouth 2 (two) times daily.  . Omega-3 Fatty Acids (FISH OIL PO) Take 2 capsules by mouth 2 (two) times daily.   . pravastatin (PRAVACHOL) 20 MG tablet TAKE 1 TABLET(20 MG) BY MOUTH DAILY   No facility-administered encounter medications on file as of 05/05/2017.    No Known Allergies  ROS:  No fever, chills, nausea, vomiting or diarrhea.  Slightly upset stomach just in the mornings.  Denies bleeding, bruising, rashes.  Denies chest pain, palpitations.  Denies myalgias/arthralgias. Bowels have been stable.   PHYSICAL EXAM:  BP 132/86 (BP Location: Left Arm, Cuff Size: Normal)   Pulse 88   Temp 98.1 F (36.7 C) (Tympanic)   Ht 5' 7.5" (1.715 m)   Wt 266 lb (120.7 kg)   BMI 41.05 kg/m   Well-appearing, pleasant female in no distress HEENT: PERRL, EOMI, conjunctiva and sclera are clear. TM's and EAC's normal. Nasal mucosa is mildly edematous, slight erythema, no purulence noted. +tender to sinus x 4 (frontal and maxillary).  OP is clear Neck: no lymphadenopathy or mass Heart: regular rate and rhythm Lungs: clear bilaterally--good air movement. No wheezes, rales, ronchi Skin: normal turgor, no rash Psych: normal mood, affect, hygiene and grooming Neuro: alert and oriented, cranial nerves intact, normal gait  ASSESSMENT/PLAN:  Acute non-recurrent  sinusitis, unspecified location - Plan: amoxicillin (AMOXIL) 500 MG tablet  Cough - Plan: benzonatate (TESSALON) 200 MG capsule   Continue to drink plenty of water. Reduce use of neti-pot to just once daily (since you aren't getting much out related to the infection) We recommend using distilled water or boiling water to help prevent any infections related to using Neti-pot. Continue the Mucinex-D twice daily.  Take the amoxil 2 pills twice daily for 10 days to treat your sinus  infection.  If you are continuing to get worse rather than better (mucus getting darker and more discolored, worsening sinus pain, fever), then we may need to switch antibiotics.  I believe you are supposed to stop taking your Humira while being treated for an infection.

## 2017-05-06 NOTE — Telephone Encounter (Signed)
Dr Ardis Hughs the pt began abx today due to sinus infection.  She is due for humira on Saturday.  She has a 10 day course.  Can she continue as planned for remicade?

## 2017-05-06 NOTE — Telephone Encounter (Signed)
The pt was advised to continue with plans for humira on Saturday

## 2017-05-06 NOTE — Telephone Encounter (Signed)
OK for her to take her humira this weekend.  Thanks

## 2017-06-03 ENCOUNTER — Other Ambulatory Visit: Payer: Self-pay

## 2017-06-11 ENCOUNTER — Ambulatory Visit (INDEPENDENT_AMBULATORY_CARE_PROVIDER_SITE_OTHER): Payer: Managed Care, Other (non HMO) | Admitting: Gastroenterology

## 2017-06-11 ENCOUNTER — Other Ambulatory Visit (INDEPENDENT_AMBULATORY_CARE_PROVIDER_SITE_OTHER): Payer: Managed Care, Other (non HMO)

## 2017-06-11 ENCOUNTER — Encounter: Payer: Self-pay | Admitting: Gastroenterology

## 2017-06-11 VITALS — BP 138/80 | HR 82 | Ht 67.5 in | Wt 259.4 lb

## 2017-06-11 DIAGNOSIS — K5080 Crohn's disease of both small and large intestine without complications: Secondary | ICD-10-CM

## 2017-06-11 LAB — CBC WITH DIFFERENTIAL/PLATELET
Basophils Absolute: 0.1 10*3/uL (ref 0.0–0.1)
Basophils Relative: 1.4 % (ref 0.0–3.0)
Eosinophils Absolute: 0.1 10*3/uL (ref 0.0–0.7)
Eosinophils Relative: 2.2 % (ref 0.0–5.0)
HEMATOCRIT: 33.9 % — AB (ref 36.0–46.0)
Hemoglobin: 10.8 g/dL — ABNORMAL LOW (ref 12.0–15.0)
LYMPHS PCT: 25.5 % (ref 12.0–46.0)
Lymphs Abs: 1 10*3/uL (ref 0.7–4.0)
MCHC: 31.8 g/dL (ref 30.0–36.0)
MCV: 81.4 fl (ref 78.0–100.0)
MONOS PCT: 11.8 % (ref 3.0–12.0)
Monocytes Absolute: 0.5 10*3/uL (ref 0.1–1.0)
NEUTROS ABS: 2.3 10*3/uL (ref 1.4–7.7)
Neutrophils Relative %: 59.1 % (ref 43.0–77.0)
PLATELETS: 340 10*3/uL (ref 150.0–400.0)
RBC: 4.17 Mil/uL (ref 3.87–5.11)
RDW: 15.7 % — AB (ref 11.5–15.5)
WBC: 3.9 10*3/uL — ABNORMAL LOW (ref 4.0–10.5)

## 2017-06-11 LAB — COMPREHENSIVE METABOLIC PANEL
ALBUMIN: 3.9 g/dL (ref 3.5–5.2)
ALK PHOS: 51 U/L (ref 39–117)
ALT: 12 U/L (ref 0–35)
AST: 17 U/L (ref 0–37)
BUN: 14 mg/dL (ref 6–23)
CO2: 28 mEq/L (ref 19–32)
Calcium: 10.3 mg/dL (ref 8.4–10.5)
Chloride: 106 mEq/L (ref 96–112)
Creatinine, Ser: 0.93 mg/dL (ref 0.40–1.20)
GFR: 64.79 mL/min (ref >60.00)
Glucose, Bld: 92 mg/dL (ref 70–99)
Potassium: 4.3 mEq/L (ref 3.5–5.1)
SODIUM: 141 meq/L (ref 135–145)
TOTAL PROTEIN: 7.3 g/dL (ref 6.0–8.3)
Total Bilirubin: 0.4 mg/dL (ref 0.2–1.2)

## 2017-06-11 NOTE — Patient Instructions (Addendum)
You will have labs checked today in the basement lab.  Please head down after you check out with the front desk  (cbc, cmet)  Please return to see Dr. Ardis Hughs in 6 months, sooner if needed.  Continue humira every other week.  Normal BMI (Body Mass Index- based on height and weight) is between 19 and 25. Your BMI today is Body mass index is 40.03 kg/m. Marland Kitchen Please consider follow up  regarding your BMI with your Primary Care Provider.

## 2017-06-11 NOTE — Progress Notes (Signed)
Review of gastrointestinal problems:  1.Crohn's ileocolitis:Workup at [facility].Marland KitchenMarland KitchenPromethius IBD Serology 7 testing 02/2008 "pattern consistent with IBD.Marland KitchenMarland KitchenCrohn's."Colonoscopy 10/2009showed terminal ileum and right colon ulcerations, pathology c/w acute and chronic ileitis, colitis, also HP polyps. Colonoscopy Path report from 2006 showed HP polyps. Office note 05/2009 documented previous C. diff colitis (treated empirically and also after toxin +). 2012 doing well on oral mesalamine 3.6 gms/day. ? Worsening of disease 03/2013, c. Diff PCR neg; increased mesalamine to 4.8gm daily. Colonoscopy 1/2051fund essentially normal colon but moderate to severe ileitis (path confirmed acute and chronic inflammation), started on 2056mazathiaprine daily Had erythema nodosum 2014, early, improved with steroids. 11/2012 doing very well on 200 azathiaprine daily. 04/2013: urgency, straining: felt to be more functional then IBD related; fiber supplements stopped. 12/2013 doing well on 200 azathiaprine daily. 03/2015 MRI enterograophy"mild wall thickening in TI and cecum...active crohn's disease", elevated inflammatory markers and so azathiaprine dose increased to 25040maily; Decreased back to 200m14mily due to blood counts, LFTs.  Started humira 11/2016  Labs 10/2016: TB quant gold negative, hep B S Ag negative, Hep B S Ab positive 2. Dysphasia:EGD August, [id number]'s ring that was dilated to 20 mm, small hiatal hernia, tortuous esophagus. Swallowing much improved. March 2012 PPI only every other day. Repeat EGD 12/2014for dysphagia showed 4cn HH, tortuous distal esophagus, Schatzki's ring that was dilated to 20mm.81mThrombosed external hemorrhoid2016, this was around the time of constipation related to pain medicine after a knee surgery. Resolve with conservative therapy 4. C. Diff + PCR 12/2016; diarrhea; treated with 2 week oral vanc course; diarrhea significantly improved after the  antibiotics.   HPI: This is a very pleasant 61 yea52old woman whom I last saw about 3 months ago.  She is here with her granddaughter today.  She continues taking Humira every other week 40 mg.  Two BMs daily, solid, brown.  No abdominal pains.  She really feels overall well.   Chief complaint is Crohn's ileocolitis  ROS: complete GI ROS as described in HPI, all other review negative.  Constitutional:  No unintentional weight loss (down 14 pounds intentionally with better eating and more routine exercise   Past Medical History:  Diagnosis Date  . Crohn's disease (HCC)  ChinchillaDental crowns present   . High cholesterol   . History of hiatal hernia   . Osteoarthritis of knee, unilateral 09/2014   right   . 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 Seasonal allergies     Past Surgical History:  Procedure Laterality Date  . APPENDECTOMY    . CESAREAN SECTION     x 3  . CHOLECYSTECTOMY    . COLONOSCOPY    . ESOPHAGOGASTRODUODENOSCOPY (EGD) WITH ESOPHAGEAL DILATION     x 2  . PATELLA-FEMORAL ARTHROPLASTY Right 10/07/2014   Procedure: RIGHT PATELLA-FEMORAL ARTHROPLASTY;  Surgeon: JoshuaMarchia Bond [facility];  Laterality: Right;  . TONSILLECTOMY AND ADENOIDECTOMY    . WISDOM TOOTH EXTRACTION      Current Outpatient Medications  Medication Sig Dispense Refill  . aspirin 81 MG tablet Take 81 mg by mouth daily.    . CALCMarland KitchenUM CITRATE PO Take 1 tablet by mouth 2 (two) times daily.    . Cholecalciferol (VITAMIN D-3 PO) Take 1 capsule by mouth at bedtime.    . Cyanocobalamin (VITAMIN B-12 PO) Take 1 tablet by mouth daily.    . DYMIMarland KitchenTA 137-50 MCG/ACT SUSP Place 1 spray into the nose as  needed.  6  . fexofenadine (ALLEGRA) 180 MG tablet Take 180 mg by mouth daily.    Marland Kitchen levocetirizine (XYZAL) 5 MG tablet Take 5 mg by mouth every evening.    . montelukast (SINGULAIR) 10 MG tablet Take 10 mg by mouth at bedtime.    . Multiple  Vitamin (MULITIVITAMIN WITH MINERALS) TABS Take 1 tablet by mouth 2 (two) times daily.    . Omega-3 Fatty Acids (FISH OIL PO) Take 2 capsules by mouth 2 (two) times daily.     . pravastatin (PRAVACHOL) 20 MG tablet TAKE 1 TABLET(20 MG) BY MOUTH DAILY 90 tablet 1  . Adalimumab (HUMIRA) 40 MG/0.8ML PSKT Inject 0.8 mLs (40 mg total) into the skin every 14 (fourteen) days. 2 each 3   No current facility-administered medications for this visit.     Allergies as of 06/11/2017  . (No Known Allergies)    Family History  Problem Relation Age of Onset  . Heart disease Father   . Hyperlipidemia Father   . Heart disease Brother 52       MI  . Arthritis Mother   . Dementia Mother   . Asthma Son   . Stroke Maternal Grandfather   . Parkinson's disease Brother   . Colon cancer Neg Hx   . Esophageal cancer Neg Hx   . Pancreatic cancer Neg Hx   . Stomach cancer Neg Hx   . Liver disease Neg Hx     Social History   Socioeconomic History  . Marital status: Married    Spouse name: Not on file  . Number of children: 3  . Years of education: Not on file  . Highest education level: Not on file  Social Needs  . Financial resource strain: Not on file  . Food insecurity - worry: Not on file  . Food insecurity - inability: Not on file  . Transportation needs - medical: Not on file  . Transportation needs - non-medical: Not on file  Occupational History  . Occupation: Retired Pharmacist, hospital  Tobacco Use  . Smoking status: Never Smoker  . Smokeless tobacco: Never Used  Substance and Sexual Activity  . Alcohol use: Yes    Comment: weekends  . Drug use: No  . Sexual activity: Yes    Birth control/protection: Post-menopausal  Other Topics Concern  . Not on file  Social History Narrative  . Not on file     Physical Exam: BP 138/80   Pulse 82   Ht 5' 7.5" (1.715 m)   Wt 259 lb 6.4 oz (117.7 kg)   BMI 40.03 kg/m  Constitutional: generally well-appearing Psychiatric: alert and oriented  x3 Abdomen: soft, nontender, nondistended, no obvious ascites, no peritoneal signs, normal bowel sounds No peripheral edema noted in lower extremities  Assessment and plan: 62 y.o. female with Crohn's ileocolitis  She is really doing well on Humira monotherapy since we made that change late last summer.  I see no reason for any changes at this point.  I recommended CBC and complete med about profile and she will return to see me in 6 months, sooner if needed.  Please see the "Patient Instructions" section for addition details about the plan.  Owens Loffler, MD [facility] 06/11/2017, 9:41 AM

## 2017-06-12 ENCOUNTER — Other Ambulatory Visit: Payer: Self-pay

## 2017-06-12 DIAGNOSIS — D649 Anemia, unspecified: Secondary | ICD-10-CM

## 2017-06-13 ENCOUNTER — Other Ambulatory Visit (INDEPENDENT_AMBULATORY_CARE_PROVIDER_SITE_OTHER): Payer: Managed Care, Other (non HMO)

## 2017-06-13 DIAGNOSIS — D649 Anemia, unspecified: Secondary | ICD-10-CM | POA: Diagnosis not present

## 2017-06-13 LAB — IBC PANEL
Iron: 25 ug/dL — ABNORMAL LOW (ref 42–145)
Saturation Ratios: 5.6 % — ABNORMAL LOW (ref 20.0–50.0)
Transferrin: 318 mg/dL (ref 212.0–360.0)

## 2017-06-13 LAB — FERRITIN: Ferritin: 9.6 ng/mL — ABNORMAL LOW (ref 10.0–291.0)

## 2017-06-19 ENCOUNTER — Other Ambulatory Visit: Payer: Self-pay

## 2017-06-19 DIAGNOSIS — D509 Iron deficiency anemia, unspecified: Secondary | ICD-10-CM

## 2017-07-15 ENCOUNTER — Other Ambulatory Visit: Payer: Self-pay

## 2017-07-15 MED ORDER — ADALIMUMAB 40 MG/0.8ML ~~LOC~~ PSKT: 40.0000 mg | PREFILLED_SYRINGE | SUBCUTANEOUS | 3 refills | Status: DC

## 2017-07-22 ENCOUNTER — Other Ambulatory Visit: Payer: Self-pay

## 2017-07-22 MED ORDER — ADALIMUMAB 40 MG/0.8ML ~~LOC~~ AJKT: 40.0000 mg | AUTO-INJECTOR | SUBCUTANEOUS | 6 refills | Status: DC

## 2017-08-11 ENCOUNTER — Other Ambulatory Visit: Payer: Self-pay | Admitting: Family Medicine

## 2017-08-11 ENCOUNTER — Other Ambulatory Visit: Payer: Managed Care, Other (non HMO)

## 2017-08-18 ENCOUNTER — Other Ambulatory Visit (INDEPENDENT_AMBULATORY_CARE_PROVIDER_SITE_OTHER): Payer: Managed Care, Other (non HMO)

## 2017-08-18 DIAGNOSIS — D509 Iron deficiency anemia, unspecified: Secondary | ICD-10-CM | POA: Diagnosis not present

## 2017-08-18 LAB — CBC WITH DIFFERENTIAL/PLATELET
Basophils Relative: 1.6 % (ref 0.0–3.0)
Eosinophils Absolute: 0.1 10*3/uL (ref 0.0–0.7)
HEMATOCRIT: 38 % (ref 36.0–46.0)
Hemoglobin: 12.7 g/dL (ref 12.0–15.0)
Lymphs Abs: 1.4 10*3/uL (ref 0.7–4.0)
MCHC: 33.3 g/dL (ref 30.0–36.0)
MCV: 87.6 fl (ref 78.0–100.0)
Monocytes Absolute: 0.6 10*3/uL (ref 0.1–1.0)
Neutro Abs: 3.4 10*3/uL (ref 1.4–7.7)
Neutrophils Relative %: 59.8 % (ref 43.0–77.0)
Platelets: 239 10*3/uL (ref 150.0–400.0)
RBC: 4.34 Mil/uL (ref 3.87–5.11)
RDW: 20.1 % — ABNORMAL HIGH (ref 11.5–15.5)
WBC: 5.7 10*3/uL (ref 4.0–10.5)

## 2017-08-19 ENCOUNTER — Other Ambulatory Visit: Payer: Self-pay | Admitting: Gastroenterology

## 2017-08-19 DIAGNOSIS — K50919 Crohn's disease, unspecified, with unspecified complications: Secondary | ICD-10-CM

## 2017-08-21 ENCOUNTER — Telehealth: Payer: Self-pay | Admitting: Gastroenterology

## 2017-08-21 NOTE — Telephone Encounter (Signed)
Pt states she forgot her humira injection this past Saturday 5.4.19 and wants to know if she needs to take it or now wait till her next scheduled injection.

## 2017-08-21 NOTE — Telephone Encounter (Signed)
Dr Ardis Hughs the pt missed her humira dose this past Saturday.  Should she get the injection now? And should she stay on the Saturday schedule after this dose?

## 2017-08-22 NOTE — Telephone Encounter (Signed)
The pt has been advised and will call with any further concerns.

## 2017-08-22 NOTE — Telephone Encounter (Signed)
Should take it now.  Then next humira dose in 8 days, back on her usual schedule.    thanks

## 2017-09-25 LAB — HM MAMMOGRAPHY

## 2017-09-26 ENCOUNTER — Encounter: Payer: Self-pay | Admitting: Family Medicine

## 2017-09-26 ENCOUNTER — Ambulatory Visit (INDEPENDENT_AMBULATORY_CARE_PROVIDER_SITE_OTHER): Payer: Managed Care, Other (non HMO) | Admitting: Family Medicine

## 2017-09-26 VITALS — BP 132/90 | HR 86 | Temp 98.3°F | Wt 258.2 lb

## 2017-09-26 DIAGNOSIS — Z79899 Other long term (current) drug therapy: Secondary | ICD-10-CM | POA: Diagnosis not present

## 2017-09-26 DIAGNOSIS — E669 Obesity, unspecified: Secondary | ICD-10-CM

## 2017-09-26 DIAGNOSIS — I10 Essential (primary) hypertension: Secondary | ICD-10-CM

## 2017-09-26 DIAGNOSIS — E7800 Pure hypercholesterolemia, unspecified: Secondary | ICD-10-CM

## 2017-09-26 LAB — POCT URINALYSIS DIP (PROADVANTAGE DEVICE)
BILIRUBIN UA: NEGATIVE
GLUCOSE UA: NEGATIVE mg/dL
Ketones, POC UA: NEGATIVE mg/dL
Leukocytes, UA: NEGATIVE
Nitrite, UA: NEGATIVE
Protein Ur, POC: NEGATIVE mg/dL
RBC UA: NEGATIVE
SPECIFIC GRAVITY, URINE: 1.025
Urobilinogen, Ur: NEGATIVE
pH, UA: 6 (ref 5.0–8.0)

## 2017-09-26 MED ORDER — PRAVASTATIN SODIUM 20 MG PO TABS: 20.0000 mg | ORAL_TABLET | Freq: Every day | ORAL | 1 refills | Status: DC

## 2017-09-26 NOTE — Patient Instructions (Signed)
Your BP today is 130/88.   Goal BP is <130/80.   Buy a blood pressure cuff and start checking your BP at home. Make sure you are eating a low salt diet and getting adequate exercise.  Keep a record of your blood pressure readings and bring the readings and your machine in to your next visit.   We will call you with your lab results.      DASH Eating Plan DASH stands for "Dietary Approaches to Stop Hypertension." The DASH eating plan is a healthy eating plan that has been shown to reduce high blood pressure (hypertension). It may also reduce your risk for type 2 diabetes, heart disease, and stroke. The DASH eating plan may also help with weight loss. What are tips for following this plan? General guidelines  Avoid eating more than 2,300 mg (milligrams) of salt (sodium) a day. If you have hypertension, you may need to reduce your sodium intake to 1,500 mg a day.  Limit alcohol intake to no more than 1 drink a day for nonpregnant women and 2 drinks a day for men. One drink equals 12 oz of beer, 5 oz of wine, or 1 oz of hard liquor.  Work with your health care provider to maintain a healthy body weight or to lose weight. Ask what an ideal weight is for you.  Get at least 30 minutes of exercise that causes your heart to beat faster (aerobic exercise) most days of the week. Activities may include walking, swimming, or biking.  Work with your health care provider or diet and nutrition specialist (dietitian) to adjust your eating plan to your individual calorie needs. Reading food labels  Check food labels for the amount of sodium per serving. Choose foods with less than 5 percent of the Daily Value of sodium. Generally, foods with less than 300 mg of sodium per serving fit into this eating plan.  To find whole grains, look for the word "whole" as the first word in the ingredient list. Shopping  Buy products labeled as "low-sodium" or "no salt added."  Buy fresh foods. Avoid canned foods  and premade or frozen meals. Cooking  Avoid adding salt when cooking. Use salt-free seasonings or herbs instead of table salt or sea salt. Check with your health care provider or pharmacist before using salt substitutes.  Do not fry foods. Cook foods using healthy methods such as baking, boiling, grilling, and broiling instead.  Cook with heart-healthy oils, such as olive, canola, soybean, or sunflower oil. Meal planning   Eat a balanced diet that includes: ? 5 or more servings of fruits and vegetables each day. At each meal, try to fill half of your plate with fruits and vegetables. ? Up to 6-8 servings of whole grains each day. ? Less than 6 oz of lean meat, poultry, or fish each day. A 3-oz serving of meat is about the same size as a deck of cards. One egg equals 1 oz. ? 2 servings of low-fat dairy each day. ? A serving of nuts, seeds, or beans 5 times each week. ? Heart-healthy fats. Healthy fats called Omega-3 fatty acids are found in foods such as flaxseeds and coldwater fish, like sardines, salmon, and mackerel.  Limit how much you eat of the following: ? Canned or prepackaged foods. ? Food that is high in trans fat, such as fried foods. ? Food that is high in saturated fat, such as fatty meat. ? Sweets, desserts, sugary drinks, and other foods with added  sugar. ? Full-fat dairy products.  Do not salt foods before eating.  Try to eat at least 2 vegetarian meals each week.  Eat more home-cooked food and less restaurant, buffet, and fast food.  When eating at a restaurant, ask that your food be prepared with less salt or no salt, if possible. What foods are recommended? The items listed may not be a complete list. Talk with your dietitian about what dietary choices are best for you. Grains Whole-grain or whole-wheat bread. Whole-grain or whole-wheat pasta. Brown rice. [place]row. Bulgur. Whole-grain and low-sodium cereals. Pita bread. Low-fat, low-sodium crackers.  Whole-wheat flour tortillas. Vegetables Fresh or frozen vegetables (raw, steamed, roasted, or grilled). Low-sodium or reduced-sodium tomato and vegetable juice. Low-sodium or reduced-sodium tomato sauce and tomato paste. Low-sodium or reduced-sodium canned vegetables. Fruits All fresh, dried, or frozen fruit. Canned fruit in natural juice (without added sugar). Meat and other protein foods Skinless chicken or [place]. Ground chicken or [place]. Pork with fat trimmed off. Fish and seafood. Egg whites. Dried beans, peas, or lentils. Unsalted nuts, nut butters, and seeds. Unsalted canned beans. Lean cuts of beef with fat trimmed off. Low-sodium, lean deli meat. Dairy Low-fat (1%) or fat-free (skim) milk. Fat-free, low-fat, or reduced-fat cheeses. Nonfat, low-sodium ricotta or cottage cheese. Low-fat or nonfat yogurt. Low-fat, low-sodium cheese. Fats and oils Soft margarine without trans fats. Vegetable oil. Low-fat, reduced-fat, or light mayonnaise and salad dressings (reduced-sodium). Canola, safflower, olive, soybean, and sunflower oils. Avocado. Seasoning and other foods Herbs. Spices. Seasoning mixes without salt. Unsalted popcorn and pretzels. Fat-free sweets. What foods are not recommended? The items listed may not be a complete list. Talk with your dietitian about what dietary choices are best for you. Grains Baked goods made with fat, such as croissants, muffins, or some breads. Dry pasta or rice meal packs. Vegetables Creamed or fried vegetables. Vegetables in a cheese sauce. Regular canned vegetables (not low-sodium or reduced-sodium). Regular canned tomato sauce and paste (not low-sodium or reduced-sodium). Regular tomato and vegetable juice (not low-sodium or reduced-sodium). Angie Fava. Olives. Fruits Canned fruit in a light or heavy syrup. Fried fruit. Fruit in cream or butter sauce. Meat and other protein foods Fatty cuts of meat. Ribs. Fried meat. Berniece Salines. Sausage. Bologna and other  processed lunch meats. Salami. Fatback. Hotdogs. Bratwurst. Salted nuts and seeds. Canned beans with added salt. Canned or smoked fish. Whole eggs or egg yolks. Chicken or [place] with skin. Dairy Whole or 2% milk, cream, and half-and-half. Whole or full-fat cream cheese. Whole-fat or sweetened yogurt. Full-fat cheese. Nondairy creamers. Whipped toppings. Processed cheese and cheese spreads. Fats and oils Butter. Stick margarine. Lard. Shortening. Ghee. Bacon fat. Tropical oils, such as coconut, palm kernel, or palm oil. Seasoning and other foods Salted popcorn and pretzels. Onion salt, garlic salt, seasoned salt, table salt, and sea salt. Worcestershire sauce. Tartar sauce. Barbecue sauce. Teriyaki sauce. Soy sauce, including reduced-sodium. Steak sauce. Canned and packaged gravies. Fish sauce. Oyster sauce. Cocktail sauce. Horseradish that you find on the shelf. Ketchup. Mustard. Meat flavorings and tenderizers. Bouillon cubes. Hot sauce and Tabasco sauce. Premade or packaged marinades. Premade or packaged taco seasonings. Relishes. Regular salad dressings. Where to find more information:  National Heart, Lung, and [place]: https://wilson-eaton.com/  American Heart Association: www.heart.org Summary  The DASH eating plan is a healthy eating plan that has been shown to reduce high blood pressure (hypertension). It may also reduce your risk for type 2 diabetes, heart disease, and stroke.  With the DASH eating  plan, you should limit salt (sodium) intake to 2,300 mg a day. If you have hypertension, you may need to reduce your sodium intake to 1,500 mg a day.  When on the DASH eating plan, aim to eat more fresh fruits and vegetables, whole grains, lean proteins, low-fat dairy, and heart-healthy fats.  Work with your health care provider or diet and nutrition specialist (dietitian) to adjust your eating plan to your individual calorie needs. This information is not intended to replace advice given to  you by your health care provider. Make sure you discuss any questions you have with your health care provider. Document Released: 03/21/2011 Document Revised: 03/25/2016 Document Reviewed: 03/25/2016 Elsevier Interactive Patient Education  [employer].

## 2017-09-26 NOTE — Progress Notes (Signed)
   Subjective:    Patient ID: Connie Porter, female    DOB: [birth year], 62 y.o.   MRN: [id number]  HPI Chief Complaint  Patient presents with  . blood pressure running high    running high at obgyn. yes bp was 150/ something. would like recheck on cholesterol   She is here with concerns regarding recently elevated BP. States her BP at her OB/GYN office yesterday was 282S systolic. She has had previously elevated readings at other offices recently as well. Does not check BP at home.   States she had HTN in the distant past and was on medication for a couple of years.  8 years ago she was seen in the ED and was found to be hypotensive. BP medication was stopped.  No BP medication since.  She was evaluated by a cardiologist at that time as well and had a negative work up per patient.   Denies fever, chills, dizziness, chest pain, palpitations, shortness of breath, abdominal pain, N/V/D, urinary symptoms, LE edema.   She started on Humira 4-5 months ago for Crohn's disease. Doing well on medication but curious if this may play a role in her HTN.   Denies history of sleep apnea. Denies snoring, daytime sleepiness, headaches or any signs of sleep apnea. States her husband has this.   Past Medical History:  Diagnosis Date  . Crohn's disease ([place])   . Dental crowns present   . High cholesterol   . History of hiatal hernia   . Osteoarthritis of knee, unilateral 09/2014   right   . 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 Seasonal allergies     Reviewed allergies, medications, past medical, surgical, family, and social history.    Review of Systems Pertinent positives and negatives in the history of present illness.     Objective:   Physical Exam BP 132/90   Pulse 86   Temp 98.3 F (36.8 C) (Oral)   Wt 258 lb 3.2 oz (117.1 kg)   BMI 39.84 kg/m   Alert and in no distress. Pharyngeal area is normal. Neck is supple without adenopathy or thyromegaly. Cardiac exam shows  a regular sinus rhythm without murmurs or gallops. Lungs are clear to auscultation. Extremities without edema, normal pulses. Skin is warm and dry, no pallor.   UA negative      Assessment & Plan:  Essential hypertension - Plan: CBC with Differential/Platelet, Comprehensive metabolic panel, POCT Urinalysis DIP (Proadvantage Device)  Pure hypercholesterolemia - Plan: Lipid panel, pravastatin (PRAVACHOL) 20 MG tablet  Medication management - Plan: Lipid panel  Obesity (BMI 30-39.9) - Plan: CBC with Differential/Platelet, Comprehensive metabolic panel, TSH, Lipid panel  BP is elevated and she does have a history of HTN that has not required medication for the past 8 years.  Unclear as to why her BP is now elevated but we will have her buy a BP cuff and start monitoring this at home.  Discussed healthy lifestyle, low sodium diet and exercise. HTN is a side effect listed for Humira but cannot say if this is the cause.  Sleep apnea screening done and this is negative.  Will check labs and follow up. She will bring in her machine and readings and return in 1-2 months for HTN recheck and nonfasting CPE.  Lipid panel done today and statin refilled.

## 2017-09-27 LAB — LIPID PANEL
Chol/HDL Ratio: 2.5 ratio (ref 0.0–4.4)
Cholesterol, Total: 214 mg/dL — ABNORMAL HIGH (ref 100–199)
HDL: 87 mg/dL (ref >39)
LDL CALC: 107 mg/dL — AB (ref 0–99)
Triglycerides: 98 mg/dL (ref 0–149)
VLDL CHOLESTEROL CAL: 20 mg/dL (ref 5–40)

## 2017-09-27 LAB — COMPREHENSIVE METABOLIC PANEL
ALBUMIN: 4.2 g/dL (ref 3.6–4.8)
ALK PHOS: 56 IU/L (ref 39–117)
ALT: 13 IU/L (ref 0–32)
AST: 16 IU/L (ref 0–40)
Albumin/Globulin Ratio: 1.4 (ref 1.2–2.2)
BUN / CREAT RATIO: 15 (ref 12–28)
BUN: 15 mg/dL (ref 8–27)
Bilirubin Total: 0.3 mg/dL (ref 0.0–1.2)
CALCIUM: 9.6 mg/dL (ref 8.7–10.3)
CO2: 24 mmol/L (ref 20–29)
CREATININE: 1 mg/dL (ref 0.57–1.00)
Chloride: 108 mmol/L — ABNORMAL HIGH (ref 96–106)
GFR calc Af Amer: 70 mL/min/{1.73_m2} (ref >59)
GFR, EST NON AFRICAN AMERICAN: 61 mL/min/{1.73_m2} (ref >59)
Globulin, Total: 2.9 g/dL (ref 1.5–4.5)
Glucose: 94 mg/dL (ref 65–99)
Potassium: 4.8 mmol/L (ref 3.5–5.2)
SODIUM: 144 mmol/L (ref 134–144)
TOTAL PROTEIN: 7.1 g/dL (ref 6.0–8.5)

## 2017-09-27 LAB — CBC WITH DIFFERENTIAL/PLATELET
Basophils Absolute: 0 10*3/uL (ref 0.0–0.2)
Basos: 1 %
EOS (ABSOLUTE): 0.1 10*3/uL (ref 0.0–0.4)
EOS: 2 %
HEMOGLOBIN: 13.2 g/dL (ref 11.1–15.9)
Hematocrit: 41.5 % (ref 34.0–46.6)
IMMATURE GRANS (ABS): 0 10*3/uL (ref 0.0–0.1)
IMMATURE GRANULOCYTES: 0 %
LYMPHS ABS: 0.9 10*3/uL (ref 0.7–3.1)
LYMPHS: 28 %
MCH: 29.3 pg (ref 26.6–33.0)
MCHC: 31.8 g/dL (ref 31.5–35.7)
MCV: 92 fL (ref 79–97)
MONOCYTES: 10 %
Monocytes Absolute: 0.3 10*3/uL (ref 0.1–0.9)
NEUTROS PCT: 59 %
Neutrophils Absolute: 2 10*3/uL (ref 1.4–7.0)
Platelets: 297 10*3/uL (ref 150–450)
RBC: 4.51 x10E6/uL (ref 3.77–5.28)
RDW: 14.8 % (ref 12.3–15.4)
WBC: 3.3 10*3/uL — AB (ref 3.4–10.8)

## 2017-09-27 LAB — TSH: TSH: 1.55 u[IU]/mL (ref 0.450–4.500)

## 2017-09-30 LAB — HM DEXA SCAN: HM Dexa Scan: NORMAL

## 2017-10-22 NOTE — Progress Notes (Signed)
Subjective:    Patient ID: Connie Porter, female    DOB: [birth year], 62 y.o.   MRN: [id number]  HPI Chief Complaint  Patient presents with  . nonfasting cpe    cpe. had lab work and urine done back in june   She is here for a complete physical exam.  Has an appointment with Dr. Ardis Hughs on July 26th. She sees him regularly for Crohn's.   Taking statin without any issues.   Other providers: [facility]- Dr. Matthew Saras  Dr. Ardis Hughs- GI  [facility]  [facility]  Dr. Donneta Romberg- allergist  Dr. Mardelle Matte - orthopedist  Dr. Allyson Sabal- Dermatologist  She is taking allergy medication daily and scheduled for allergy testing in September.   Social history: Lives with her husband and dog, is not working. Retired from [employer].  Denies smoking, drinking alcohol, drug use  Diet: improved diet  Excerise: starting to exercise   Immunizations: up to date, will check on shingrix vaccine with Dr. Ardis Hughs.   Health maintenance:  Mammogram: June 2019  Colonoscopy: up to date  Last Gynecological Exam: up to date  DEXA: June 2019  Pregnancies: every 6 months  Last Dental Exam: twice annually  Last Eye Exam: annually   Wears seatbelt always, uses sunscreen, smoke detectors in home and functioning, does not text while driving and feels safe in home environment.   Reviewed allergies, medications, past medical, surgical, family, and social history.   Review of Systems Review of Systems Constitutional: -fever, -chills, -sweats, -unexpected weight change,-fatigue ENT: -runny nose, -ear pain, -sore throat Cardiology:  -chest pain, -palpitations, -edema Respiratory: -cough, -shortness of breath, -wheezing Gastroenterology: -abdominal pain, -nausea, -vomiting, -diarrhea, -constipation  Hematology: -bleeding or bruising problems Musculoskeletal: -arthralgias, -myalgias, -joint swelling, -back pain Ophthalmology: -vision changes Urology: -dysuria, -difficulty urinating, -hematuria,  -urinary frequency, -urgency Neurology: -headache, -weakness, -tingling, -numbness       Objective:   Physical Exam BP 130/84   Pulse 85   Ht 5' 6.5" (1.689 m)   Wt 261 lb 12.8 oz (118.8 kg)   BMI 41.62 kg/m   General Appearance:    Alert, cooperative, no distress, appears stated age  Head:    Normocephalic, without obvious abnormality, atraumatic  Eyes:    PERRL, conjunctiva/corneas clear, EOM's intact, fundi    benign  Ears:    Normal TM's and external ear canals  Nose:   Nares normal, mucosa normal, no drainage or sinus   tenderness  Throat:   Lips, mucosa, and tongue normal; teeth and gums normal  Neck:   Supple, no lymphadenopathy;  thyroid:  no   enlargement/tenderness/nodules; no carotid   bruit or JVD  Back:    Spine nontender, no curvature, ROM normal, no CVA     tenderness  Lungs:     Clear to auscultation bilaterally without wheezes, rales or     ronchi; respirations unlabored  Chest Wall:    No tenderness or deformity   Heart:    Regular rate and rhythm, S1 and S2 normal, no murmur, rub   or gallop  Breast Exam:    OB/GYN  Abdomen:     Soft, non-tender, nondistended, normoactive bowel sounds,    no masses, no hepatosplenomegaly  Genitalia:    OB/GYN     Extremities:   No clubbing, cyanosis or edema  Pulses:   2+ and symmetric all extremities  Skin:   Skin color, texture, turgor normal, no rashes or lesions  Lymph nodes:   Cervical, supraclavicular, and axillary  nodes normal  Neurologic:   CNII-XII intact, normal strength, sensation and gait; reflexes 2+ and symmetric throughout          Psych:   Normal mood, affect, hygiene and grooming.        Assessment & Plan:  Routine general medical examination at a health care facility  Pure hypercholesterolemia  Crohn's disease of both small and large intestine without complication ([place])  Morbid obesity ([place])  Environmental and seasonal allergies  She is doing well. Reviewed las from last month with patient.    BP is closer to goal range. History of HTN that has not required medication in several years. She will continue to monitor at home.  Counseling done on healthy diet and exercise for management of chronic health conditions. Continue on current medications.  Continue seeing specialists for chronic health conditions.  Up to date with health maintenance.  Immunizations up to date. She will check on shingrix and let me know if she wants this.  Follow up in 6 months.

## 2017-10-23 ENCOUNTER — Encounter: Payer: Self-pay | Admitting: Family Medicine

## 2017-10-23 ENCOUNTER — Ambulatory Visit (INDEPENDENT_AMBULATORY_CARE_PROVIDER_SITE_OTHER): Payer: Managed Care, Other (non HMO) | Admitting: Family Medicine

## 2017-10-23 VITALS — BP 130/84 | HR 85 | Ht 66.5 in | Wt 261.8 lb

## 2017-10-23 DIAGNOSIS — Z0000 Encounter for general adult medical examination without abnormal findings: Secondary | ICD-10-CM

## 2017-10-23 DIAGNOSIS — J3089 Other allergic rhinitis: Secondary | ICD-10-CM | POA: Diagnosis not present

## 2017-10-23 DIAGNOSIS — E7800 Pure hypercholesterolemia, unspecified: Secondary | ICD-10-CM | POA: Diagnosis not present

## 2017-10-23 DIAGNOSIS — K5080 Crohn's disease of both small and large intestine without complications: Secondary | ICD-10-CM | POA: Diagnosis not present

## 2017-10-23 NOTE — Patient Instructions (Addendum)
Check with Dr. Ardis Hughs regarding the Shingrix vaccine and then check with your insurance company to see what your financial responsibility will be and then let me know if you would like to get this   Your BP today is 130/84. This is very close to goal range. Goal is <130/80.   Preventative Care for Adults - Female      MAINTAIN REGULAR HEALTH EXAMS:  A routine yearly physical is a good way to check in with your primary care provider about your health and preventive screening. It is also an opportunity to share updates about your health and any concerns you have, and receive a thorough all-over exam.   Most health insurance companies pay for at least some preventative services.  Check with your health plan for specific coverages.  WHAT PREVENTATIVE SERVICES DO WOMEN NEED?  Adult women should have their weight and blood pressure checked regularly.   Women age 60 and older should have their cholesterol levels checked regularly.  Women should be screened for cervical cancer with a Pap smear and pelvic exam beginning at age 8.  Breast cancer screening generally begins at age 36 with a mammogram and breast exam by your primary care provider.    Beginning at age 44 and continuing to age 61, women should be screened for colorectal cancer.  Certain people may need continued testing until age 52.  Updating vaccinations is part of preventative care.  Vaccinations help protect against diseases such as the flu.  Osteoporosis is a disease in which the bones lose minerals and strength as we age. Women ages 52 and over should discuss this with their caregivers, as should women after menopause who have other risk factors.  Lab tests are generally done as part of preventative care to screen for anemia and blood disorders, to screen for problems with the kidneys and liver, to screen for bladder problems, to check blood sugar, and to check your cholesterol level.  Preventative services generally include  counseling about diet, exercise, avoiding tobacco, drugs, excessive alcohol consumption, and sexually transmitted infections.    GENERAL RECOMMENDATIONS FOR GOOD HEALTH:  Healthy diet:  Eat a variety of foods, including fruit, vegetables, animal or vegetable protein, such as meat, fish, chicken, and eggs, or beans, lentils, tofu, and grains, such as rice.  Drink plenty of water daily.  Decrease saturated fat in the diet, avoid lots of red meat, processed foods, sweets, fast foods, and fried foods.  Exercise:  Aerobic exercise helps maintain good heart health. At least 30-40 minutes of moderate-intensity exercise is recommended. For example, a brisk walk that increases your heart rate and breathing. This should be done on most days of the week.   Find a type of exercise or a variety of exercises that you enjoy so that it becomes a part of your daily life.  Examples are running, walking, swimming, water aerobics, and biking.  For motivation and support, explore group exercise such as aerobic class, spin class, Zumba, Yoga,or  martial arts, etc.    Set exercise goals for yourself, such as a certain weight goal, walk or run in a race such as a 5k walk/run.  Speak to your primary care provider about exercise goals.  Disease prevention:  If you smoke or chew tobacco, find out from your caregiver how to quit. It can literally save your life, no matter how long you have been a tobacco user. If you do not use tobacco, never begin.   Maintain a healthy diet and  normal weight. Increased weight leads to problems with blood pressure and diabetes.   The Body Mass Index or BMI is a way of measuring how much of your body is fat. Having a BMI above 27 increases the risk of heart disease, diabetes, hypertension, stroke and other problems related to obesity. Your caregiver can help determine your BMI and based on it develop an exercise and dietary program to help you achieve or maintain this important  measurement at a healthful level.  High blood pressure causes heart and blood vessel problems.  Persistent high blood pressure should be treated with medicine if weight loss and exercise do not work.   Fat and cholesterol leaves deposits in your arteries that can block them. This causes heart disease and vessel disease elsewhere in your body.  If your cholesterol is found to be high, or if you have heart disease or certain other medical conditions, then you may need to have your cholesterol monitored frequently and be treated with medication.   Ask if you should have a cardiac stress test if your history suggests this. A stress test is a test done on a treadmill that looks for heart disease. This test can find disease prior to there being a problem.  Menopause can be associated with physical symptoms and risks. Hormone replacement therapy is available to decrease these. You should talk to your caregiver about whether starting or continuing to take hormones is right for you.   Osteoporosis is a disease in which the bones lose minerals and strength as we age. This can result in serious bone fractures. Risk of osteoporosis can be identified using a bone density scan. Women ages 41 and over should discuss this with their caregivers, as should women after menopause who have other risk factors. Ask your caregiver whether you should be taking a calcium supplement and Vitamin D, to reduce the rate of osteoporosis.   Avoid drinking alcohol in excess (more than two drinks per day).  Avoid use of street drugs. Do not share needles with anyone. Ask for professional help if you need assistance or instructions on stopping the use of alcohol, cigarettes, and/or drugs.  Brush your teeth twice a day with fluoride toothpaste, and floss once a day. Good oral hygiene prevents tooth decay and gum disease. The problems can be painful, unattractive, and can cause other health problems. Visit your dentist for a routine oral  and dental check up and preventive care every 6-12 months.   Look at your skin regularly.  Use a mirror to look at your back. Notify your caregivers of changes in moles, especially if there are changes in shapes, colors, a size larger than a pencil eraser, an irregular border, or development of new moles.  Safety:  Use seatbelts 100% of the time, whether driving or as a passenger.  Use safety devices such as hearing protection if you work in environments with loud noise or significant background noise.  Use safety glasses when doing any work that could send debris in to the eyes.  Use a helmet if you ride a bike or motorcycle.  Use appropriate safety gear for contact sports.  Talk to your caregiver about gun safety.  Use sunscreen with a SPF (or skin protection factor) of 15 or greater.  Lighter skinned people are at a greater risk of skin cancer. Don't forget to also wear sunglasses in order to protect your eyes from too much damaging sunlight. Damaging sunlight can accelerate cataract formation.   Practice safe  sex. Use condoms. Condoms are used for birth control and to help reduce the spread of sexually transmitted infections (or STIs).  Some of the STIs are gonorrhea (the clap), chlamydia, syphilis, trichomonas, herpes, HPV (human papilloma virus) and HIV (human immunodeficiency virus) which causes AIDS. The herpes, HIV and HPV are viral illnesses that have no cure. These can result in disability, cancer and death.   Keep carbon monoxide and smoke detectors in your home functioning at all times. Change the batteries every 6 months or use a model that plugs into the wall.   Vaccinations:  Stay up to date with your tetanus shots and other required immunizations. You should have a booster for tetanus every 10 years. Be sure to get your flu shot every year, since 5%-20% of the [place] population comes down with the flu. The flu vaccine changes each year, so being vaccinated once is not enough. Get your  shot in the fall, before the flu season peaks.   Other vaccines to consider:  Human Papilloma Virus or HPV causes cancer of the cervix, and other infections that can be transmitted from person to person. There is a vaccine for HPV, and females should get immunized between the ages of 76 and 38. It requires a series of 3 shots.   Pneumococcal vaccine to protect against certain types of pneumonia.  This is normally recommended for adults age 72 or older.  However, adults younger than 63 years old with certain underlying conditions such as diabetes, heart or lung disease should also receive the vaccine.  Shingles vaccine to protect against Varicella Zoster if you are older than age 56, or younger than 63 years old with certain underlying illness.  Hepatitis A vaccine to protect against a form of infection of the liver by a virus acquired from food.  Hepatitis B vaccine to protect against a form of infection of the liver by a virus acquired from blood or body fluids, particularly if you work in health care.  If you plan to travel internationally, check with your local health department for specific vaccination recommendations.  Cancer Screening:  Breast cancer screening is essential to preventive care for women. All women age 25 and older should perform a breast self-exam every month. At age 15 and older, women should have their caregiver complete a breast exam each year. Women at ages 81 and older should have a mammogram (x-ray film) of the breasts. Your caregiver can discuss how often you need mammograms.    Cervical cancer screening includes taking a Pap smear (sample of cells examined under a microscope) from the cervix (end of the uterus). It also includes testing for HPV (Human Papilloma Virus, which can cause cervical cancer). Screening and a pelvic exam should begin at age 83, or 3 years after a woman becomes sexually active. Screening should occur every year, with a Pap smear but no HPV  testing, up to age 41. After age 19, you should have a Pap smear every 3 years with HPV testing, if no HPV was found previously.   Most routine colon cancer screening begins at the age of 31. On a yearly basis, doctors may provide special easy to use take-home tests to check for hidden blood in the stool. Sigmoidoscopy or colonoscopy can detect the earliest forms of colon cancer and is life saving. These tests use a small camera at the end of a tube to directly examine the colon. Speak to your caregiver about this at age 48, when routine screening  begins (and is repeated every 5 years unless early forms of pre-cancerous polyps or small growths are found).

## 2017-11-04 ENCOUNTER — Telehealth: Payer: Self-pay | Admitting: Family Medicine

## 2017-11-04 NOTE — Telephone Encounter (Signed)
[facility]. Included is Bone Density, mammogram and pap. Sending back for review.

## 2017-11-06 ENCOUNTER — Other Ambulatory Visit (INDEPENDENT_AMBULATORY_CARE_PROVIDER_SITE_OTHER): Payer: Managed Care, Other (non HMO)

## 2017-11-06 DIAGNOSIS — K50919 Crohn's disease, unspecified, with unspecified complications: Secondary | ICD-10-CM

## 2017-11-06 LAB — CBC WITH DIFFERENTIAL/PLATELET
BASOS ABS: 0.1 10*3/uL (ref 0.0–0.1)
Basophils Relative: 1.3 % (ref 0.0–3.0)
Eosinophils Absolute: 0.1 10*3/uL (ref 0.0–0.7)
Eosinophils Relative: 2.1 % (ref 0.0–5.0)
HCT: 39.6 % (ref 36.0–46.0)
Hemoglobin: 13.4 g/dL (ref 12.0–15.0)
LYMPHS PCT: 30 % (ref 12.0–46.0)
Lymphs Abs: 1.4 10*3/uL (ref 0.7–4.0)
MCHC: 33.7 g/dL (ref 30.0–36.0)
MCV: 94.6 fl (ref 78.0–100.0)
MONOS PCT: 10.9 % (ref 3.0–12.0)
Monocytes Absolute: 0.5 10*3/uL (ref 0.1–1.0)
NEUTROS PCT: 55.7 % (ref 43.0–77.0)
Neutro Abs: 2.6 10*3/uL (ref 1.4–7.7)
Platelets: 315 10*3/uL (ref 150.0–400.0)
RBC: 4.19 Mil/uL (ref 3.87–5.11)
RDW: 14 % (ref 11.5–15.5)
WBC: 4.7 10*3/uL (ref 4.0–10.5)

## 2017-11-06 LAB — COMPREHENSIVE METABOLIC PANEL
ALBUMIN: 4 g/dL (ref 3.5–5.2)
ALT: 13 U/L (ref 0–35)
AST: 15 U/L (ref 0–37)
Alkaline Phosphatase: 51 U/L (ref 39–117)
BILIRUBIN TOTAL: 0.4 mg/dL (ref 0.2–1.2)
BUN: 18 mg/dL (ref 6–23)
CALCIUM: 9.2 mg/dL (ref 8.4–10.5)
CO2: 28 mEq/L (ref 19–32)
Chloride: 105 mEq/L (ref 96–112)
Creatinine, Ser: 0.88 mg/dL (ref 0.40–1.20)
GFR: 68.96 mL/min (ref >60.00)
Glucose, Bld: 94 mg/dL (ref 70–99)
Potassium: 4.2 mEq/L (ref 3.5–5.1)
Sodium: 139 mEq/L (ref 135–145)
Total Protein: 7.4 g/dL (ref 6.0–8.3)

## 2017-11-11 ENCOUNTER — Encounter: Payer: Self-pay | Admitting: Family Medicine

## 2017-11-12 ENCOUNTER — Other Ambulatory Visit: Payer: Self-pay | Admitting: Family Medicine

## 2017-11-14 ENCOUNTER — Ambulatory Visit (INDEPENDENT_AMBULATORY_CARE_PROVIDER_SITE_OTHER): Payer: Managed Care, Other (non HMO) | Admitting: Gastroenterology

## 2017-11-14 ENCOUNTER — Other Ambulatory Visit: Payer: Managed Care, Other (non HMO)

## 2017-11-14 ENCOUNTER — Encounter: Payer: Self-pay | Admitting: Gastroenterology

## 2017-11-14 VITALS — BP 122/84 | HR 76 | Ht 67.0 in | Wt 262.0 lb

## 2017-11-14 DIAGNOSIS — K5080 Crohn's disease of both small and large intestine without complications: Secondary | ICD-10-CM

## 2017-11-14 NOTE — Patient Instructions (Addendum)
TB quant gold testing today (this is an annual test while you are on humira). Please return to see Dr. Ardis Hughs in 6 months. Continue humira every other week.  Normal BMI (Body Mass Index- based on height and weight) is between 19 and 25. Your BMI today is Body mass index is 41.04 kg/m. Marland Kitchen Please consider follow up  regarding your BMI with your Primary Care Provider.

## 2017-11-14 NOTE — Progress Notes (Signed)
Review of gastrointestinal problems:  1.Crohn's ileocolitis:Workup at [facility].Marland KitchenMarland KitchenPromethius IBD Serology 7 testing 02/2008 "pattern consistent with IBD.Marland KitchenMarland KitchenCrohn's."Colonoscopy 10/2009showed terminal ileum and right colon ulcerations, pathology c/w acute and chronic ileitis, colitis, also HP polyps. Colonoscopy Path report from 2006 showed HP polyps. Office note 05/2009 documented previous C. diff colitis (treated empirically and also after toxin +). 2012 doing well on oral mesalamine 3.6 gms/day. ? Worsening of disease 03/2013, c. Diff PCR neg; increased mesalamine to 4.8gm daily. Colonoscopy 1/2026fund essentially normal colon but moderate to severe ileitis (path confirmed acute and chronic inflammation), started on 2039mazathiaprine daily Had erythema nodosum 2014, early, improved with steroids. 11/2012 doing very well on 200 azathiaprine daily. 04/2013: urgency, straining: felt to be more functional then IBD related; fiber supplements stopped. 12/2013 doing well on 200 azathiaprine daily. 03/2015 MRI enterograophy"mild wall thickening in TI and cecum...active crohn's disease", elevated inflammatory markers and so azathiaprine dose increased to 25038maily; Decreased back to 200m56mily due to blood counts, LFTs.  Started humira 11/2016  Labs 10/2016: TB quant gold negative, hep B S Ag negative, Hep B S Ab positive 2. Dysphasia:EGD August, 2011fo20fSchatzki's ring that was dilated to 20 mm, small hiatal hernia, tortuous esophagus. Swallowing much improved. March 2012 PPI only every other day. Repeat EGD 12/2014for dysphagia showed 4cn HH, tortuous distal esophagus, Schatzki's ring that was dilated to 20mm.75mThrombosed external hemorrhoid2016, this was around the time of constipation related to pain medicine after a knee surgery. Resolve with conservative therapy 4. C. Diff + PCR 12/2016;diarrhea; treated with 2 week oral vanc course;diarrhea significantly improved after the  antibiotics.   HPI: This is a very pleasant 62 yea20old woman whom I last saw about 6 months ago  Chief complaint is Crohn's ileocolitis  She is doing very well on Humira 40 mg every other week.  She has no abdominal pains.  She has a soft semi-formed stool once daily.  No overt bleeding.  She has no trouble getting the Humira on time, usually an overnight delivery.  ROS: complete GI ROS as described in HPI, all other review negative.  Constitutional:  No unintentional weight loss   Past Medical History:  Diagnosis Date  . Crohn's disease (HCC)  [place] crow[place] present   . High cholesterol   . History of hiatal hernia   . Osteoarthritis of knee, unilateral 09/2014   right   . 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 Seasonal allergies     Past Surgical History:  Procedure Laterality Date  . APPENDECTOMY    . CESAREAN SECTION     x 3  . CHOLECYSTECTOMY    . COLONOSCOPY    . ESOPHAGOGASTRODUODENOSCOPY (EGD) WITH ESOPHAGEAL DILATION     x 2  . PATELLA-FEMORAL ARTHROPLASTY Right 10/07/2014   Procedure: RIGHT PATELLA-FEMORAL ARTHROPLASTY;  Surgeon: JoshuaMarchia Bond [facility];  Laterality: Right;  . TONSILLECTOMY AND ADENOIDECTOMY    . WISDOM TOOTH EXTRACTION      Current Outpatient Medications  Medication Sig Dispense Refill  . aspirin 81 MG tablet Take 81 mg by mouth daily.    . CALCMarland KitchenUM CITRATE PO Take 1 tablet by mouth 2 (two) times daily.    . Cholecalciferol (VITAMIN D-3 PO) Take 1 capsule by mouth at bedtime.    . Cyanocobalamin (VITAMIN B-12 PO) Take 1 tablet by mouth daily.    . DYMIMarland KitchenTA 137-50 MCG/ACT SUSP Place 1 spray into the nose as needed.  6  . fexofenadine (ALLEGRA) 180 MG tablet Take 180 mg by mouth daily.    . Iron-Folic Acid-Vit H29 (IRON FORMULA PO) Take by mouth.    . levocetirizine (XYZAL) 5 MG tablet Take 5 mg by mouth every evening.    . montelukast (SINGULAIR) 10 MG tablet Take 10 mg by  mouth at bedtime.    . Multiple Vitamin (MULITIVITAMIN WITH MINERALS) TABS Take 1 tablet by mouth 2 (two) times daily.    . Omega-3 Fatty Acids (FISH OIL PO) Take 2 capsules by mouth 2 (two) times daily.     . pravastatin (PRAVACHOL) 20 MG tablet Take 1 tablet (20 mg total) by mouth daily. 90 tablet 1  . Adalimumab (HUMIRA PEN) 40 MG/0.8ML PNKT Inject 40 mg into the skin every 14 (fourteen) days. 2 each 6   No current facility-administered medications for this visit.     Allergies as of 11/14/2017  . (No Known Allergies)    Family History  Problem Relation Age of Onset  . Heart disease Father   . Hyperlipidemia Father   . Heart disease Brother 55       MI  . Arthritis Mother   . Dementia Mother   . Asthma Son   . Stroke Maternal Grandfather   . Parkinson's disease Brother   . Colon cancer Neg Hx   . Esophageal cancer Neg Hx   . Pancreatic cancer Neg Hx   . Stomach cancer Neg Hx   . Liver disease Neg Hx     Social History   Socioeconomic History  . Marital status: Married    Spouse name: Not on file  . Number of children: 3  . Years of education: Not on file  . Highest education level: Not on file  Occupational History  . Occupation: Retired Tour manager  . Financial resource strain: Not on file  . Food insecurity:    Worry: Not on file    Inability: Not on file  . Transportation needs:    Medical: Not on file    Non-medical: Not on file  Tobacco Use  . Smoking status: Never Smoker  . Smokeless tobacco: Never Used  Substance and Sexual Activity  . Alcohol use: Yes    Comment: weekends  . Drug use: No  . Sexual activity: Yes    Birth control/protection: Post-menopausal  Lifestyle  . Physical activity:    Days per week: Not on file    Minutes per session: Not on file  . Stress: Not on file  Relationships  . Social connections:    Talks on phone: Not on file    Gets together: Not on file    Attends religious service: Not on file    Active  member of club or organization: Not on file    Attends meetings of clubs or organizations: Not on file    Relationship status: Not on file  . Intimate partner violence:    Fear of current or ex partner: Not on file    Emotionally abused: Not on file    Physically abused: Not on file    Forced sexual activity: Not on file  Other Topics Concern  . Not on file  Social History Narrative  . Not on file     Physical Exam: BP 122/84   Pulse 76   Ht 5' 7"  (1.702 m)   Wt 262 lb (118.8 kg)   BMI 41.04 kg/m  Constitutional: generally well-appearing Psychiatric: alert and oriented x3  Abdomen: soft, nontender, nondistended, no obvious ascites, no peritoneal signs, normal bowel sounds No peripheral edema noted in lower extremities  Assessment and plan: 62 y.o. female with Crohn's ileocolitis under good control clinically on Humira every other week  She is doing well and I see no reason for any further changes currently.  She needs TB testing which is annually while on Biologics.  She had labs checked last week showing CBC and complete metabolic profile were all perfectly normal.  She will return to see me in 6 months and sooner if needed.  Please see the "Patient Instructions" section for addition details about the plan.  Owens Loffler, MD [facility] 11/14/2017, 9:27 AM

## 2017-11-16 LAB — QUANTIFERON-TB GOLD PLUS
Mitogen-NIL: 7.63 IU/mL
NIL: 0.04 [IU]/mL
QuantiFERON-TB Gold Plus: NEGATIVE
TB1-NIL: 0 IU/mL

## 2017-11-24 ENCOUNTER — Telehealth: Payer: Self-pay | Admitting: Gastroenterology

## 2017-11-24 NOTE — Telephone Encounter (Signed)
Dr Ardis Hughs the pt would like to know if she can get the Shingles vaccine this year, it is not a live vaccine.  Please advise.  She is aware that you are out of the office until next week.

## 2017-11-24 NOTE — Telephone Encounter (Signed)
Patient wanting to know if she can have the single shot. Patient states Dr.Jacobs said no last year but wanted to check about this year.

## 2017-11-28 NOTE — Telephone Encounter (Signed)
The patient has been notified of this information and all questions answered.

## 2017-11-28 NOTE — Telephone Encounter (Signed)
Ok to vaccinate with RZV (shingrix) vaccination.  Thanks

## 2018-01-26 ENCOUNTER — Other Ambulatory Visit: Payer: Self-pay | Admitting: Gastroenterology

## 2018-01-26 MED ORDER — ADALIMUMAB 40 MG/0.8ML ~~LOC~~ AJKT: 40.0000 mg | AUTO-INJECTOR | SUBCUTANEOUS | 6 refills | Status: DC

## 2018-04-27 ENCOUNTER — Ambulatory Visit (INDEPENDENT_AMBULATORY_CARE_PROVIDER_SITE_OTHER): Payer: Managed Care, Other (non HMO) | Admitting: Family Medicine

## 2018-04-27 ENCOUNTER — Encounter: Payer: Self-pay | Admitting: Family Medicine

## 2018-04-27 VITALS — BP 142/90 | HR 68 | Temp 97.8°F | Ht 67.0 in | Wt 221.0 lb

## 2018-04-27 DIAGNOSIS — K5080 Crohn's disease of both small and large intestine without complications: Secondary | ICD-10-CM

## 2018-04-27 DIAGNOSIS — E7800 Pure hypercholesterolemia, unspecified: Secondary | ICD-10-CM | POA: Diagnosis not present

## 2018-04-27 DIAGNOSIS — E669 Obesity, unspecified: Secondary | ICD-10-CM | POA: Insufficient documentation

## 2018-04-27 DIAGNOSIS — J3089 Other allergic rhinitis: Secondary | ICD-10-CM

## 2018-04-27 DIAGNOSIS — I10 Essential (primary) hypertension: Secondary | ICD-10-CM

## 2018-04-27 DIAGNOSIS — Z79899 Other long term (current) drug therapy: Secondary | ICD-10-CM

## 2018-04-27 HISTORY — DX: Essential (primary) hypertension: I10

## 2018-04-27 NOTE — Patient Instructions (Addendum)
Congratulations on your weight loss! Continue eating a healthy diet and exercising.   Your BP today is 142/90. Continue to monitor this and if your readings are not consistently below 130/80, other than the days right after your Humira injections, let me know.   We will call you with your lab results and if your cholesterol has improved, consider stopping the statin.

## 2018-04-27 NOTE — Progress Notes (Signed)
   Subjective:    Patient ID: Connie Porter, female    DOB: Jun 09, 1955, 63 y.o.   MRN: [id number]  HPI Chief Complaint  Patient presents with  . Hypertension    follow up  . Hyperlipidemia    follow up    She is here to follow up on hypertension controlled with diet and exercise as well as elevated LDL. Reports good compliance with statin.  No side effects.  Humira injection was given Saturday afternoon. States her BP is always elevated for the first few days after the Humira injection and then goes back to normal.   Started [facility] for weight loss in August and has lost 45 lbs. States the have monitored her BP every Friday and it is always normal.  Stopped gluten, starches.  States her resting metabolic rate improved.  Reports eating 72- 92 carbohydrates daily.  Exercising 5 days per week.   She is feeling better than she has in quite some time.  Plans to lose another 45 pounds.  Requests lipid panel. She thinks her LDL should be improved since weight loss.   Allergies controlled with Singulair and Xyzal.  Denies fever, chills, dizziness, chest pain, palpitations, shortness of breath, abdominal pain, N/V/D, urinary symptoms, LE edema.   Reviewed allergies, medications, past medical, surgical, family, and social history.   Review of Systems Pertinent positives and negatives in the history of present illness.     Objective:   Physical Exam BP (!) 142/90   Pulse 68   Temp 97.8 F (36.6 C) (Oral)   Ht 5' 7"  (1.702 m)   Wt 221 lb (100.2 kg)   SpO2 98%   BMI 34.61 kg/m   Alert and oriented and in no distress. Pharyngeal area is normal.  Cardiac exam shows a regular sinus rhythm without murmurs or gallops. Lungs are clear to auscultation. Extremities without edema. Skin is warm and dry.       Assessment & Plan:  Pure hypercholesterolemia - Plan: Lipid panel  Crohn's disease of both small and large intestine without complication (HCC)  Environmental and seasonal  allergies  Essential hypertension - Plan: CBC with Differential/Platelet, Comprehensive metabolic panel  Obesity (BMI 30-39.9) - Plan: CBC with Differential/Platelet, Comprehensive metabolic panel, TSH, T4, free  Medication management - Plan: Lipid panel  Congratulated her on weight loss.  She is doing this through [facility] in their recommendations. Discussed that her blood pressure today is elevated and she is adamant that her blood pressure is only elevated on the first few days after Humira injections and the other wise it is always in goal range.  She will continue monitoring her blood pressure outside of here.  She is aware that goal blood pressure is less than 130/80 and that if she is seeing these elevated numbers consistently that she may need to start on medication. She will continue with healthy diet and exercise. Request that I recheck her cholesterol since she has a significant weight loss.  She has been taking pravastatin daily.  Questions whether we can stop this. Continue on medications for allergies. Continue seeing GI for Crohn's. Follow-up pending labs or after July 11 for fasting CPE.

## 2018-04-28 LAB — COMPREHENSIVE METABOLIC PANEL
ALK PHOS: 62 IU/L (ref 39–117)
ALT: 14 IU/L (ref 0–32)
AST: 17 IU/L (ref 0–40)
Albumin/Globulin Ratio: 1.5 (ref 1.2–2.2)
Albumin: 4.2 g/dL (ref 3.6–4.8)
BILIRUBIN TOTAL: 0.5 mg/dL (ref 0.0–1.2)
BUN / CREAT RATIO: 16 (ref 12–28)
BUN: 14 mg/dL (ref 8–27)
CHLORIDE: 103 mmol/L (ref 96–106)
CO2: 23 mmol/L (ref 20–29)
CREATININE: 0.89 mg/dL (ref 0.57–1.00)
Calcium: 9.7 mg/dL (ref 8.7–10.3)
GFR calc Af Amer: 80 mL/min/{1.73_m2} (ref >59)
GFR calc non Af Amer: 69 mL/min/{1.73_m2} (ref >59)
GLOBULIN, TOTAL: 2.8 g/dL (ref 1.5–4.5)
GLUCOSE: 87 mg/dL (ref 65–99)
Potassium: 4.4 mmol/L (ref 3.5–5.2)
SODIUM: 139 mmol/L (ref 134–144)
Total Protein: 7 g/dL (ref 6.0–8.5)

## 2018-04-28 LAB — LIPID PANEL
CHOL/HDL RATIO: 2.3 ratio (ref 0.0–4.4)
Cholesterol, Total: 189 mg/dL (ref 100–199)
HDL: 83 mg/dL (ref >39)
LDL Calculated: 92 mg/dL (ref 0–99)
Triglycerides: 71 mg/dL (ref 0–149)
VLDL Cholesterol Cal: 14 mg/dL (ref 5–40)

## 2018-04-28 LAB — CBC WITH DIFFERENTIAL/PLATELET
Basophils Absolute: 0 10*3/uL (ref 0.0–0.2)
Basos: 1 %
EOS (ABSOLUTE): 0.1 10*3/uL (ref 0.0–0.4)
Eos: 2 %
HEMATOCRIT: 40.6 % (ref 34.0–46.6)
Hemoglobin: 13.5 g/dL (ref 11.1–15.9)
Immature Grans (Abs): 0 10*3/uL (ref 0.0–0.1)
Immature Granulocytes: 0 %
LYMPHS ABS: 1.3 10*3/uL (ref 0.7–3.1)
Lymphs: 31 %
MCH: 31 pg (ref 26.6–33.0)
MCHC: 33.3 g/dL (ref 31.5–35.7)
MCV: 93 fL (ref 79–97)
Monocytes Absolute: 0.4 10*3/uL (ref 0.1–0.9)
Monocytes: 9 %
Neutrophils Absolute: 2.5 10*3/uL (ref 1.4–7.0)
Neutrophils: 57 %
Platelets: 289 10*3/uL (ref 150–450)
RBC: 4.35 x10E6/uL (ref 3.77–5.28)
RDW: 12.4 % (ref 11.7–15.4)
WBC: 4.4 10*3/uL (ref 3.4–10.8)

## 2018-04-28 LAB — TSH: TSH: 0.713 u[IU]/mL (ref 0.450–4.500)

## 2018-04-28 LAB — T4, FREE: Free T4: 1.21 ng/dL (ref 0.82–1.77)

## 2018-05-26 ENCOUNTER — Ambulatory Visit (INDEPENDENT_AMBULATORY_CARE_PROVIDER_SITE_OTHER): Payer: Managed Care, Other (non HMO) | Admitting: Gastroenterology

## 2018-05-26 ENCOUNTER — Encounter: Payer: Self-pay | Admitting: Gastroenterology

## 2018-05-26 VITALS — BP 106/82 | HR 73 | Ht 67.0 in | Wt 216.0 lb

## 2018-05-26 DIAGNOSIS — K5080 Crohn's disease of both small and large intestine without complications: Secondary | ICD-10-CM | POA: Diagnosis not present

## 2018-05-26 MED ORDER — ADALIMUMAB 40 MG/0.8ML ~~LOC~~ AJKT: 40.0000 mg | AUTO-INJECTOR | SUBCUTANEOUS | 6 refills | Status: DC

## 2018-05-26 NOTE — Patient Instructions (Addendum)
Make sure that humira prescription is current.  Please return to see Dr. Ardis Hughs in 6 months.  Thank you for entrusting me with your care and choosing [facility].  Dr Ardis Hughs

## 2018-05-26 NOTE — Progress Notes (Signed)
Review of gastrointestinal problems:  1.Crohn's ileocolitis:Workup at [facility].Marland KitchenMarland KitchenPromethius IBD Serology 7 testing 02/2008 "pattern consistent with IBD.Marland KitchenMarland KitchenCrohn's."Colonoscopy 10/2009showed terminal ileum and right colon ulcerations, pathology c/w acute and chronic ileitis, colitis, also HP polyps. Colonoscopy Path report from 2006 showed HP polyps. Office note 05/2009 documented previous C. diff colitis (treated empirically and also after toxin +). 2012 doing well on oral mesalamine 3.6 gms/day. ? Worsening of disease 03/2013, c. Diff PCR neg; increased mesalamine to 4.8gm daily. Colonoscopy 1/2047fund essentially normal colon but moderate to severe ileitis (path confirmed acute and chronic inflammation), started on 2053mazathiaprine daily Had erythema nodosum 2014, early, improved with steroids. 11/2012 doing very well on 200 azathiaprine daily. 04/2013: urgency, straining: felt to be more functional then IBD related; fiber supplements stopped. 12/2013 doing well on 200 azathiaprine daily. 03/2015 MRI enterograophy"mild wall thickening in TI and cecum...active crohn's disease", elevated inflammatory markers and so azathiaprine dose increased to 25056maily; Decreased back to 200m63mily due to blood counts, LFTs.  Started humira 11/2016  Labs 10/2016: TB quant gold negative, hep B S Ag negative, Hep B S Ab positive  TB quant gold 11/2017 negative 2. Dysphasia:EGD August, [id number]'s ring that was dilated to 20 mm, small hiatal hernia, tortuous esophagus. Swallowing much improved. March 2012 PPI only every other day. Repeat EGD 12/2014for dysphagia showed 4cn HH, tortuous distal esophagus, Schatzki's ring that was dilated to 20mm.30mThrombosed external hemorrhoid2016, this was around the time of constipation related to pain medicine after a knee surgery. Resolve with conservative therapy 4. C. Diff + PCR 12/2016;diarrhea; treated with 2 week oral vanc course;diarrhea significantly improved  after the antibiotics.    HPI: This is a very pleasant 62 yea15old woman whom I last saw about 6 months ago.  Since then she has lost about 45 pounds intentionally.  She has dramatically changed her diet and has been exercising several times a week.  She is committed to even losing another 10 to 15 pounds.  She looks great, feels great.  Much more energy overall.  Her bowels are fine.  She has a solid bowel movement once a day, actually got a bit constipated when she made significant diet changes several months ago and so she has been taking Citrucel and a probiotic.  No bleeding, no significant abdominal pains.  Chief complaint is Crohn's ileocolitis  ROS: complete GI ROS as described in HPI, all other review negative.   Constitutional:  No unintentional weight loss   Past Medical History:  Diagnosis Date  . Crohn's disease (HCC)  [place]   . High cholesterol   . History of hiatal hernia   . HTN (hypertension) 04/27/2018  . Osteoarthritis of knee, unilateral 09/2014   right   . 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 Seasonal allergies     Past Surgical History:  Procedure Laterality Date  . APPENDECTOMY    . CESAREAN SECTION     x 3  . CHOLECYSTECTOMY    . COLONOSCOPY    . ESOPHAGOGASTRODUODENOSCOPY (EGD) WITH ESOPHAGEAL DILATION     x 2  . PATELLA-FEMORAL ARTHROPLASTY Right 10/07/2014   Procedure: RIGHT PATELLA-FEMORAL ARTHROPLASTY;  Surgeon: JoshuaMarchia Bond [facility];  Laterality: Right;  . TONSILLECTOMY AND ADENOIDECTOMY    . WISDOM TOOTH EXTRACTION      Current Outpatient Medications  Medication Sig Dispense Refill  . aspirin 81 MG tablet Take 81 mg by mouth daily.    .Marland Kitchen  CALCIUM CITRATE PO Take 1 tablet by mouth 2 (two) times daily.    . Cholecalciferol (VITAMIN D-3 PO) Take 1 capsule by mouth at bedtime.    . Cyanocobalamin (VITAMIN B-12 PO) Take 1 tablet by mouth daily.    Marland Kitchen  DYMISTA 137-50 MCG/ACT SUSP Place 1 spray into the nose as needed.  6  . fexofenadine (ALLEGRA) 180 MG tablet Take 180 mg by mouth daily.    . Iron-Folic Acid-Vit N82 (IRON FORMULA PO) Take by mouth.    . levocetirizine (XYZAL) 5 MG tablet Take 5 mg by mouth every evening.    . montelukast (SINGULAIR) 10 MG tablet Take 10 mg by mouth at bedtime.    . Multiple Vitamin (MULITIVITAMIN WITH MINERALS) TABS Take 1 tablet by mouth 2 (two) times daily.    . Omega-3 Fatty Acids (FISH OIL PO) Take 2 capsules by mouth 2 (two) times daily.     . Adalimumab (HUMIRA PEN) 40 MG/0.8ML PNKT Inject 40 mg into the skin every 14 (fourteen) days. 2 each 6   No current facility-administered medications for this visit.     Allergies as of 05/26/2018  . (No Known Allergies)    Family History  Problem Relation Age of Onset  . Heart disease Father   . Hyperlipidemia Father   . Heart disease Brother 63       MI  . Arthritis Mother   . Dementia Mother   . Asthma Son   . Stroke Maternal Grandfather   . Parkinson's disease Brother   . Colon cancer Neg Hx   . Esophageal cancer Neg Hx   . Pancreatic cancer Neg Hx   . Stomach cancer Neg Hx   . Liver disease Neg Hx     Social History   Socioeconomic History  . Marital status: Married    Spouse name: Not on file  . Number of children: 3  . Years of education: Not on file  . Highest education level: Not on file  Occupational History  . Occupation: Retired Tour manager  . Financial resource strain: Not on file  . Food insecurity:    Worry: Not on file    Inability: Not on file  . Transportation needs:    Medical: Not on file    Non-medical: Not on file  Tobacco Use  . Smoking status: Never Smoker  . Smokeless tobacco: Never Used  Substance and Sexual Activity  . Alcohol use: Yes    Comment: weekends  . Drug use: No  . Sexual activity: Yes    Birth control/protection: Post-menopausal  Lifestyle  . Physical activity:    Days per  week: Not on file    Minutes per session: Not on file  . Stress: Not on file  Relationships  . Social connections:    Talks on phone: Not on file    Gets together: Not on file    Attends religious service: Not on file    Active member of club or organization: Not on file    Attends meetings of clubs or organizations: Not on file    Relationship status: Not on file  . Intimate partner violence:    Fear of current or ex partner: Not on file    Emotionally abused: Not on file    Physically abused: Not on file    Forced sexual activity: Not on file  Other Topics Concern  . Not on file  Social History Narrative  . Not on file  Physical Exam: BP 106/82   Pulse 73   Ht 5' 7"  (1.702 m)   Wt 216 lb (98 kg)   BMI 33.83 kg/m  Constitutional: generally well-appearing Psychiatric: alert and oriented x3 Abdomen: soft, nontender, nondistended, no obvious ascites, no peritoneal signs, normal bowel sounds No peripheral edema noted in lower extremities  Assessment and plan: 63 y.o. female with Crohn's ileocolitis  She is doing very well.  So proud of her for her 40 to 50 pound weight loss intentionally with diet and exercise lifestyle modifications.  Her Crohn's is under very good control still on numeric 40 mg every other week.  We will make sure that her prescription is up to date in her pharmacy and with her insurance company.  She will return to see me in 6 months and sooner if any problems.  Please see the "Patient Instructions" section for addition details about the plan.  Owens Loffler, MD [facility] 05/26/2018, 9:09 AM

## 2018-10-01 LAB — HM MAMMOGRAPHY

## 2018-10-28 ENCOUNTER — Other Ambulatory Visit: Payer: Self-pay

## 2018-10-28 ENCOUNTER — Ambulatory Visit (INDEPENDENT_AMBULATORY_CARE_PROVIDER_SITE_OTHER): Payer: Managed Care, Other (non HMO) | Admitting: Family Medicine

## 2018-10-28 ENCOUNTER — Encounter: Payer: Self-pay | Admitting: Family Medicine

## 2018-10-28 VITALS — BP 122/76 | HR 66 | Temp 97.8°F | Wt 188.2 lb

## 2018-10-28 DIAGNOSIS — E7800 Pure hypercholesterolemia, unspecified: Secondary | ICD-10-CM | POA: Diagnosis not present

## 2018-10-28 NOTE — Progress Notes (Signed)
   Subjective:    Patient ID: Connie Porter, female    DOB: 1955-11-20, 63 y.o.   MRN: [id number]  HPI Chief Complaint  Patient presents with  . 6 month follow-up    6 month follow-up  on bp and cholesterol   She is here for a 6 month follow up since stopping her statin. She has lost a significant amount of weight by healthy lifestyle changes. Weight loss is intentional. She would like to have her lipids checked since being off the statin.  No other concerns. States she BP was elevated in the past slightly.   She is going to [facility]. States she is not taking any supplements.  Taking iron due to Humira.  GI is following her for IDA and Crohn's.   Denies fever, chills, dizziness, chest pain, palpitations, shortness of breath, abdominal pain, N/V/D, urinary symptoms, LE edema.   Reviewed allergies, medications, past medical, surgical, family, and social history.    Review of Systems Pertinent positives and negatives in the history of present illness.     Objective:   Physical Exam BP 122/76   Pulse 66   Temp 97.8 F (36.6 C) (Oral)   Wt 188 lb 3.2 oz (85.4 kg)   BMI 29.48 kg/m   Alert and oriented and in no acute distress. Not otherwise examined.       Assessment & Plan:  Pure hypercholesterolemia - Plan: Lipid panel  She is in her usual state of health today.  Congratulated her on weight loss and healthy weight loss. Will recheck her lipids since stopping her statin. Follow up pending labs.

## 2018-10-28 NOTE — Patient Instructions (Addendum)
Congratulations on your weight loss.  Your blood pressure is normal at 122/76.   We will call you with your lab results.   You are due for your annual physical when it is convenient for your schedule.

## 2018-10-29 LAB — LIPID PANEL
Chol/HDL Ratio: 3 ratio (ref 0.0–4.4)
Cholesterol, Total: 267 mg/dL — ABNORMAL HIGH (ref 100–199)
HDL: 90 mg/dL (ref >39)
LDL Calculated: 162 mg/dL — ABNORMAL HIGH (ref 0–99)
Triglycerides: 74 mg/dL (ref 0–149)
VLDL Cholesterol Cal: 15 mg/dL (ref 5–40)

## 2018-11-27 ENCOUNTER — Ambulatory Visit: Payer: Managed Care, Other (non HMO) | Admitting: Gastroenterology

## 2018-12-01 ENCOUNTER — Encounter: Payer: Self-pay | Admitting: Gastroenterology

## 2018-12-01 ENCOUNTER — Other Ambulatory Visit: Payer: Self-pay

## 2018-12-01 ENCOUNTER — Other Ambulatory Visit (INDEPENDENT_AMBULATORY_CARE_PROVIDER_SITE_OTHER): Payer: Managed Care, Other (non HMO)

## 2018-12-01 ENCOUNTER — Ambulatory Visit (INDEPENDENT_AMBULATORY_CARE_PROVIDER_SITE_OTHER): Payer: Managed Care, Other (non HMO) | Admitting: Gastroenterology

## 2018-12-01 VITALS — BP 138/88 | HR 70 | Temp 98.1°F | Ht 67.0 in | Wt 188.4 lb

## 2018-12-01 DIAGNOSIS — K5080 Crohn's disease of both small and large intestine without complications: Secondary | ICD-10-CM

## 2018-12-01 LAB — CBC WITH DIFFERENTIAL/PLATELET
Basophils Absolute: 0.1 10*3/uL (ref 0.0–0.1)
Basophils Relative: 1 % (ref 0.0–3.0)
Eosinophils Absolute: 0 10*3/uL (ref 0.0–0.7)
Eosinophils Relative: 1 % (ref 0.0–5.0)
HCT: 42.3 % (ref 36.0–46.0)
Hemoglobin: 14.1 g/dL (ref 12.0–15.0)
Lymphocytes Relative: 21.1 % (ref 12.0–46.0)
Lymphs Abs: 1 10*3/uL (ref 0.7–4.0)
MCHC: 33.3 g/dL (ref 30.0–36.0)
MCV: 94.1 fl (ref 78.0–100.0)
Monocytes Absolute: 0.3 10*3/uL (ref 0.1–1.0)
Monocytes Relative: 6.6 % (ref 3.0–12.0)
Neutro Abs: 3.4 10*3/uL (ref 1.4–7.7)
Neutrophils Relative %: 70.3 % (ref 43.0–77.0)
Platelets: 253 10*3/uL (ref 150.0–400.0)
RBC: 4.49 Mil/uL (ref 3.87–5.11)
RDW: 12.8 % (ref 11.5–15.5)
WBC: 4.8 10*3/uL (ref 4.0–10.5)

## 2018-12-01 LAB — COMPREHENSIVE METABOLIC PANEL
ALT: 14 U/L (ref 0–35)
AST: 20 U/L (ref 0–37)
Albumin: 4.2 g/dL (ref 3.5–5.2)
Alkaline Phosphatase: 44 U/L (ref 39–117)
BUN: 22 mg/dL (ref 6–23)
CO2: 27 mEq/L (ref 19–32)
Calcium: 10 mg/dL (ref 8.4–10.5)
Chloride: 105 mEq/L (ref 96–112)
Creatinine, Ser: 0.81 mg/dL (ref 0.40–1.20)
GFR: 71.15 mL/min (ref >60.00)
Glucose, Bld: 91 mg/dL (ref 70–99)
Potassium: 3.6 mEq/L (ref 3.5–5.1)
Sodium: 139 mEq/L (ref 135–145)
Total Bilirubin: 0.5 mg/dL (ref 0.2–1.2)
Total Protein: 7.3 g/dL (ref 6.0–8.3)

## 2018-12-01 MED ORDER — HUMIRA PEN 40 MG/0.8ML ~~LOC~~ PNKT: 40.0000 mg | PEN_INJECTOR | SUBCUTANEOUS | 6 refills | Status: DC

## 2018-12-01 NOTE — Progress Notes (Signed)
review of gastrointestinal problems:  1.Crohn's ileocolitis:Workup at [facility].Marland KitchenMarland KitchenPromethius IBD Serology 7 testing 02/2008 "pattern consistent with IBD.Marland KitchenMarland KitchenCrohn's."Colonoscopy 10/2009showed terminal ileum and right colon ulcerations, pathology c/w acute and chronic ileitis, colitis, also HP polyps. Colonoscopy Path report from 2006 showed HP polyps. Office note 05/2009 documented previous C. diff colitis (treated empirically and also after toxin +). 2012 doing well on oral mesalamine 3.6 gms/day. ? Worsening of disease 03/2013, c. Diff PCR neg; increased mesalamine to 4.8gm daily. Colonoscopy 1/2068fund essentially normal colon but moderate to severe ileitis (path confirmed acute and chronic inflammation), started on 2083mazathiaprine daily Had erythema nodosum 2014, early, improved with steroids. 11/2012 doing very well on 200 azathiaprine daily. 04/2013: urgency, straining: felt to be more functional then IBD related; fiber supplements stopped. 12/2013 doing well on 200 azathiaprine daily. 03/2015 MRI enterograophy"mild wall thickening in TI and cecum...active crohn's disease", elevated inflammatory markers and so azathiaprine dose increased to 25063maily; Decreased back to 200m66mily due to blood counts, LFTs.  Started humira 11/2016 40mg27m  Labs 10/2016: TB quant gold negative, hep B S Ag negative, Hep B S Ab positive  TB quant gold 11/2017 negative 2. Dysphasia:EGD August, [id number]'s ring that was dilated to 20 mm, small hiatal hernia, tortuous esophagus. Swallowing much improved. March 2012 PPI only every other day. Repeat EGD 12/2014for dysphagia showed 4cn HH, tortuous distal esophagus, Schatzki's ring that was dilated to 20mm. 22mhrombosed external hemorrhoid2016, this was around the time of constipation related to pain medicine after a knee surgery. Resolve with conservative therapy 4. C. Diff + PCR 12/2016;diarrhea; treated with 2 week oral vanc course;diarrhea significantly  improved after the antibiotics.    HPI: This is a very pleasant 63 year38ld woman whom I last saw 6 or 8 months ago. She lost 28 pounds since her last visit, intentionally.  Diet and exercise.  Her goal is to lose about 15 more pounds.  She looks great and feels great.  She tends to get a little constipated periodically.  She takes Citrucel every day.  No abdominal pains, no overt GI bleeding.  Chief complaint is Crohn's ileocolitis  ROS: complete GI ROS as described in HPI, all other review negative.  Constitutional:  No unintentional weight loss   Past Medical History:  Diagnosis Date  . Crohn's disease (HCC)   [place] present   . High cholesterol   . History of hiatal hernia   . HTN (hypertension) 04/27/2018  . Osteoarthritis of knee, unilateral 09/2014   right   . 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 Seasonal allergies     Past Surgical History:  Procedure Laterality Date  . APPENDECTOMY    . CESAREAN SECTION     x 3  . CHOLECYSTECTOMY    . COLONOSCOPY    . ESOPHAGOGASTRODUODENOSCOPY (EGD) WITH ESOPHAGEAL DILATION     x 2  . PATELLA-FEMORAL ARTHROPLASTY Right 10/07/2014   Procedure: RIGHT PATELLA-FEMORAL ARTHROPLASTY;  Surgeon: Joshua Marchia BondLocation: [facility];  Laterality: Right;  . TONSILLECTOMY AND ADENOIDECTOMY    . WISDOM TOOTH EXTRACTION      Current Outpatient Medications  Medication Sig Dispense Refill  . Adalimumab (HUMIRA PEN) 40 MG/0.8ML PNKT Inject 40 mg into the skin every 14 (fourteen) days for 30 days. 2 each 6  . aspirin 81 MG tablet Take 81 mg by mouth daily.    . CALCIMarland KitchenM CITRATE PO Take 1 tablet by mouth 2 (two) times daily.    .Marland Kitchen  Cholecalciferol (VITAMIN D-3 PO) Take 1 capsule by mouth at bedtime.    . Cyanocobalamin (VITAMIN B-12 PO) Take 1 tablet by mouth daily.    Marland Kitchen DYMISTA 137-50 MCG/ACT SUSP Place 1 spray into the nose as needed.  6  . fexofenadine (ALLEGRA) 180 MG tablet  Take 180 mg by mouth daily.    . Iron-Folic Acid-Vit F74 (IRON FORMULA PO) Take by mouth.    . levocetirizine (XYZAL) 5 MG tablet Take 5 mg by mouth every evening.    . montelukast (SINGULAIR) 10 MG tablet Take 10 mg by mouth at bedtime.    . Multiple Vitamin (MULITIVITAMIN WITH MINERALS) TABS Take 1 tablet by mouth 2 (two) times daily.    . Omega-3 Fatty Acids (FISH OIL PO) Take 2 capsules by mouth 2 (two) times daily.      No current facility-administered medications for this visit.     Allergies as of 12/01/2018  . (No Known Allergies)    Family History  Problem Relation Age of Onset  . Heart disease Father   . Hyperlipidemia Father   . Heart disease Brother 28       MI  . Arthritis Mother   . Dementia Mother   . Asthma Son   . Stroke Maternal Grandfather   . Parkinson's disease Brother   . Colon cancer Neg Hx   . Esophageal cancer Neg Hx   . Pancreatic cancer Neg Hx   . Stomach cancer Neg Hx   . Liver disease Neg Hx     Social History   Socioeconomic History  . Marital status: Married    Spouse name: Not on file  . Number of children: 3  . Years of education: Not on file  . Highest education level: Not on file  Occupational History  . Occupation: Retired Tour manager  . Financial resource strain: Not on file  . Food insecurity    Worry: Not on file    Inability: Not on file  . Transportation needs    Medical: Not on file    Non-medical: Not on file  Tobacco Use  . Smoking status: Never Smoker  . Smokeless tobacco: Never Used  Substance and Sexual Activity  . Alcohol use: Yes    Comment: weekends  . Drug use: No  . Sexual activity: Yes    Birth control/protection: Post-menopausal  Lifestyle  . Physical activity    Days per week: Not on file    Minutes per session: Not on file  . Stress: Not on file  Relationships  . Social Herbalist on phone: Not on file    Gets together: Not on file    Attends religious service: Not on file     Active member of club or organization: Not on file    Attends meetings of clubs or organizations: Not on file    Relationship status: Not on file  . Intimate partner violence    Fear of current or ex partner: Not on file    Emotionally abused: Not on file    Physically abused: Not on file    Forced sexual activity: Not on file  Other Topics Concern  . Not on file  Social History Narrative  . Not on file     Physical Exam: BP 138/88 (BP Location: Left Arm, Patient Position: Sitting, Cuff Size: Normal)   Pulse 70   Temp 98.1 F (36.7 C) (Oral)   Ht 5' 7"  (1.702 m)  Wt 188 lb 6 oz (85.4 kg)   SpO2 98%   BMI 29.50 kg/m  Constitutional: generally well-appearing Psychiatric: alert and oriented x3 Abdomen: soft, nontender, nondistended, no obvious ascites, no peritoneal signs, normal bowel sounds No peripheral edema noted in lower extremities  Assessment and plan: 63 y.o. female with Crohn's ileocolitis  She is doing very well on Humira 40 mg every other week.  I see no reason to change that regimen at this point.  She needs refills on her Humira and I am happy to do that for her.  She has basic set of labs including CBC, complete metabolic profile and TB quant gold tested.  She will return to see me in 12 months and sooner if needed.  Please see the "Patient Instructions" section for addition details about the plan.  Owens Loffler, MD [facility] 12/01/2018, 9:15 AM

## 2018-12-01 NOTE — Patient Instructions (Signed)
Your provider has requested that you go to the basement level for lab work before leaving today. Press "B" on the elevator. The lab is located at the first door on the left as you exit the elevator.  We have sent the following medications to your pharmacy for you to pick up at your convenience: Humira  Return office visit in 1 year  Thank you for entrusting me with your care and choosing [facility].  Dr Ardis Hughs

## 2018-12-04 LAB — QUANTIFERON-TB GOLD PLUS
Mitogen-NIL: 6 IU/mL
NIL: 0.01 IU/mL
QuantiFERON-TB Gold Plus: NEGATIVE
TB1-NIL: 0 IU/mL
TB2-NIL: 0 IU/mL

## 2019-03-02 ENCOUNTER — Encounter: Payer: Managed Care, Other (non HMO) | Admitting: Family Medicine

## 2019-03-19 ENCOUNTER — Other Ambulatory Visit: Payer: Self-pay

## 2019-03-19 ENCOUNTER — Encounter: Payer: Self-pay | Admitting: Family Medicine

## 2019-03-19 ENCOUNTER — Ambulatory Visit (INDEPENDENT_AMBULATORY_CARE_PROVIDER_SITE_OTHER): Payer: Managed Care, Other (non HMO) | Admitting: Family Medicine

## 2019-03-19 VITALS — BP 120/70 | HR 80 | Temp 97.0°F | Ht 67.0 in | Wt 182.8 lb

## 2019-03-19 DIAGNOSIS — K5080 Crohn's disease of both small and large intestine without complications: Secondary | ICD-10-CM | POA: Diagnosis not present

## 2019-03-19 DIAGNOSIS — Z23 Encounter for immunization: Secondary | ICD-10-CM | POA: Diagnosis not present

## 2019-03-19 DIAGNOSIS — Z0000 Encounter for general adult medical examination without abnormal findings: Secondary | ICD-10-CM | POA: Diagnosis not present

## 2019-03-19 DIAGNOSIS — E7800 Pure hypercholesterolemia, unspecified: Secondary | ICD-10-CM

## 2019-03-19 NOTE — Patient Instructions (Signed)

## 2019-03-19 NOTE — Progress Notes (Signed)
Subjective:    Patient ID: Connie Porter, female    DOB: 02/22/1956, 63 y.o.   MRN: [id number]  HPI Chief Complaint  Patient presents with  . fasting cpe    fasting cpe, sees obgyn   She is here for a complete physical exam. No new complaints or concerns today.    Other providers: Dr. Matthew Saras- OB/GYN Dr. Ardis Hughs- GI [facility]  [facility]  Dr. Donneta Romberg- allergist  Dr. Mardelle Matte - orthopedist  Dr. Allyson Sabal- Dermatologist  [facility] MD. Working on weight loss.   States she has lost a significant amount of weight intentionally. Doing well with diet and exercise.    HL- last checked lipids in July 2020. Her LDL has increased from 92 to 162 since January 2020. Her 10 year ASCVD risk 4.3%. controlled with diet and exercise.   Followed by Dr. Ardis Hughs for IDA and Crohn's.    Social history: Lives with her husband, retired Pharmacist, hospital in 2011  Denies smoking, drug use. Has 1-2 glasses of wine on the weekends.  Diet: very healthy per patient  Excerise: regularly, cardio and weight lifting.   Immunizations: October 2020 at [facility] on [address]. Shingrix   Health maintenance:  Mammogram: June 2020 Colonoscopy: UTD  Last Gynecological Exam: June 2020  Last Dental Exam: every 6 months. Due this month  Last Eye Exam: one year ago.   Wears seatbelt always, smoke detectors in home and functioning, does not text while driving and feels safe in home environment.   Reviewed allergies, medications, past medical, surgical, family, and social history.  Review of Systems Review of Systems Constitutional: -fever, -chills, -sweats, -unexpected weight change,-fatigue ENT: -runny nose, -ear pain, -sore throat Cardiology:  -chest pain, -palpitations, -edema Respiratory: -cough, -shortness of breath, -wheezing Gastroenterology: -abdominal pain, -nausea, -vomiting, -diarrhea, -constipation  Hematology: -bleeding or bruising problems Musculoskeletal: -arthralgias, -myalgias, -joint  swelling, -back pain Ophthalmology: -vision changes Urology: -dysuria, -difficulty urinating, -hematuria, -urinary frequency, -urgency Neurology: -headache, -weakness, -tingling, -numbness       Objective:   Physical Exam BP 120/70   Pulse 80   Temp (!) 97 F (36.1 C)   Ht 5' 7"  (1.702 m)   Wt 182 lb 12.8 oz (82.9 kg)   BMI 28.63 kg/m   General Appearance:    Alert, cooperative, no distress, appears stated age  Head:    Normocephalic, without obvious abnormality, atraumatic  Eyes:    PERRL, conjunctiva/corneas clear, EOM's intact  Ears:    Normal TM's and external ear canals  Nose:  Mask in place  Throat:  Mask in place  Neck:   Supple, no lymphadenopathy;  thyroid:  no   enlargement/tenderness/nodules  Back:    Spine nontender, no curvature, ROM normal, no CVA     tenderness  Lungs:     Clear to auscultation bilaterally without wheezes, rales or     ronchi; respirations unlabored  Chest Wall:    No tenderness or deformity   Heart:    Regular rate and rhythm, S1 and S2 normal, no murmur, rub   or gallop  Breast Exam:   OB/GYN  Abdomen:     Soft, non-tender, nondistended, normoactive bowel sounds,    no masses, no hepatosplenomegaly  Genitalia:   OB/GYN     Extremities:   No clubbing, cyanosis or edema  Pulses:   2+ and symmetric all extremities  Skin:   Skin color, texture, turgor normal, no rashes or lesions  Lymph nodes:   Cervical, supraclavicular, and axillary nodes  normal  Neurologic:   CNII-XII intact, normal strength, sensation and gait; reflexes 2+ and symmetric throughout          Psych:   Normal mood, affect, hygiene and grooming.         Assessment & Plan:  Routine general medical examination at a health care facility - Plan: Lipid Panel -Here today for fasting CPE.  Preventive healthcare discussed.  She is followed by her OB/GYN and reports being up-to-date on her mammogram and pelvic exams.  Colonoscopy is up-to-date and she sees Dr. Ardis Hughs regularly.   Immunizations reviewed and I will request her recent flu vaccine and Shingrix vaccine [facility] on [address].  She is due for Prevnar 13 today Discussed safety.  She appears to be taking good care of herself.  Crohn's disease of both small and large intestine without complication ([place]) -Controlled.  Followed closely by Dr. Ardis Hughs  Pure hypercholesterolemia - Plan: Lipid Panel -Her LDL increase significantly from January to June 2020 without explanation.  She reports a very healthy diet and is exercising regularly.  We will repeat this today.  Her 10-year A ASCVD risk as of July 2020 was 4.3%.  She has taken a statin in the past but after significant weight loss stopped it.  Need for vaccination against Streptococcus pneumoniae - Plan: Pneumococcal conjugate vaccine 13-valent -Counseling on this vaccine.  Given per guidelines.

## 2019-03-20 ENCOUNTER — Encounter: Payer: Self-pay | Admitting: Family Medicine

## 2019-03-20 LAB — LIPID PANEL
Chol/HDL Ratio: 3 ratio (ref 0.0–4.4)
Cholesterol, Total: 269 mg/dL — ABNORMAL HIGH (ref 100–199)
HDL: 91 mg/dL (ref >39)
LDL Chol Calc (NIH): 168 mg/dL — ABNORMAL HIGH (ref 0–99)
Triglycerides: 66 mg/dL (ref 0–149)
VLDL Cholesterol Cal: 10 mg/dL (ref 5–40)

## 2019-03-22 ENCOUNTER — Other Ambulatory Visit: Payer: Self-pay | Admitting: Family Medicine

## 2019-03-22 DIAGNOSIS — E7800 Pure hypercholesterolemia, unspecified: Secondary | ICD-10-CM

## 2019-03-22 DIAGNOSIS — Z79899 Other long term (current) drug therapy: Secondary | ICD-10-CM

## 2019-03-22 MED ORDER — PRAVASTATIN SODIUM 20 MG PO TABS: 20.0000 mg | ORAL_TABLET | Freq: Every day | ORAL | 0 refills | Status: DC

## 2019-04-12 ENCOUNTER — Telehealth: Payer: Self-pay | Admitting: Family Medicine

## 2019-04-12 NOTE — Telephone Encounter (Signed)
Requested immunization record received from [facility] on [address]

## 2019-04-23 ENCOUNTER — Encounter: Payer: Self-pay | Admitting: Internal Medicine

## 2019-05-03 ENCOUNTER — Other Ambulatory Visit: Payer: Self-pay

## 2019-05-03 ENCOUNTER — Other Ambulatory Visit: Payer: Managed Care, Other (non HMO)

## 2019-05-03 DIAGNOSIS — E7800 Pure hypercholesterolemia, unspecified: Secondary | ICD-10-CM

## 2019-05-03 DIAGNOSIS — Z79899 Other long term (current) drug therapy: Secondary | ICD-10-CM

## 2019-05-04 ENCOUNTER — Other Ambulatory Visit: Payer: Self-pay | Admitting: Family Medicine

## 2019-05-04 LAB — LIPID PANEL
Chol/HDL Ratio: 2.6 ratio (ref 0.0–4.4)
Cholesterol, Total: 236 mg/dL — ABNORMAL HIGH (ref 100–199)
HDL: 91 mg/dL (ref >39)
LDL Chol Calc (NIH): 131 mg/dL — ABNORMAL HIGH (ref 0–99)
Triglycerides: 85 mg/dL (ref 0–149)
VLDL Cholesterol Cal: 14 mg/dL (ref 5–40)

## 2019-05-04 LAB — ALT: ALT: 13 IU/L (ref 0–32)

## 2019-05-04 MED ORDER — PRAVASTATIN SODIUM 40 MG PO TABS: 40.0000 mg | ORAL_TABLET | Freq: Every day | ORAL | 1 refills | Status: DC

## 2019-05-11 ENCOUNTER — Encounter: Payer: Self-pay | Admitting: Internal Medicine

## 2019-05-18 ENCOUNTER — Telehealth: Payer: Self-pay | Admitting: Family Medicine

## 2019-05-18 NOTE — Telephone Encounter (Signed)
Requested pap received from [facility]

## 2019-06-08 ENCOUNTER — Telehealth: Payer: Self-pay

## 2019-06-08 MED ORDER — HUMIRA PEN 40 MG/0.8ML ~~LOC~~ PNKT: 40.0000 mg | PEN_INJECTOR | SUBCUTANEOUS | 6 refills | Status: DC

## 2019-06-15 MED ORDER — HUMIRA PEN 40 MG/0.8ML ~~LOC~~ PNKT: 40.0000 mg | PEN_INJECTOR | SUBCUTANEOUS | 6 refills | Status: DC

## 2019-06-15 NOTE — Telephone Encounter (Signed)
Gina from [facility] called to inform that pt's insurance has changed and [facility] no longer fills Humira.  Please send rx to [facility] in [place].  Fax: [phone]

## 2019-06-15 NOTE — Telephone Encounter (Signed)
The pt prescription has been sent as requested  

## 2019-06-15 NOTE — Addendum Note (Signed)
Addended by: Timothy Lasso on: 06/15/2019 11:33 AM   Modules accepted: Orders

## 2019-06-18 ENCOUNTER — Telehealth: Payer: Self-pay | Admitting: Gastroenterology

## 2019-06-18 NOTE — Telephone Encounter (Signed)
The pt has been advised that she is ok to have the COVID vaccine while on Humira.  The pt has been advised of the information and verbalized understanding.

## 2019-07-07 NOTE — Telephone Encounter (Signed)
Connie Porter from [facility] informed that Humira requires a PA.  She stated that she has started PA on CoverMyMeds.  CID: [id number].

## 2019-07-08 NOTE — Telephone Encounter (Signed)
Pt needs Humira shipped to her by tomorrow she is going out of town.Pt states she has [facility] [id number]  [phone]. Insurance  Under husband Jakaya Jacobowitz   Please advise  [phone]

## 2019-07-08 NOTE — Telephone Encounter (Signed)
PA completed on cover my meds, left message for pt that she should be able to call her pharmacy now to get the med.

## 2019-08-03 ENCOUNTER — Other Ambulatory Visit: Payer: Self-pay

## 2019-08-03 MED ORDER — HUMIRA PEN 40 MG/0.8ML ~~LOC~~ PNKT: 40.0000 mg | PEN_INJECTOR | SUBCUTANEOUS | 3 refills | Status: DC

## 2019-09-18 ENCOUNTER — Other Ambulatory Visit: Payer: Self-pay | Admitting: Family Medicine

## 2019-10-11 DIAGNOSIS — Z6831 Body mass index (BMI) 31.0-31.9, adult: Secondary | ICD-10-CM | POA: Diagnosis not present

## 2019-10-11 DIAGNOSIS — Z1231 Encounter for screening mammogram for malignant neoplasm of breast: Secondary | ICD-10-CM | POA: Diagnosis not present

## 2019-10-11 DIAGNOSIS — Z01419 Encounter for gynecological examination (general) (routine) without abnormal findings: Secondary | ICD-10-CM | POA: Diagnosis not present

## 2019-10-13 DIAGNOSIS — M25561 Pain in right knee: Secondary | ICD-10-CM | POA: Diagnosis not present

## 2019-10-21 ENCOUNTER — Other Ambulatory Visit: Payer: Self-pay | Admitting: Family Medicine

## 2019-10-21 DIAGNOSIS — E7800 Pure hypercholesterolemia, unspecified: Secondary | ICD-10-CM

## 2019-10-26 ENCOUNTER — Encounter: Payer: Self-pay | Admitting: Family Medicine

## 2019-10-26 ENCOUNTER — Ambulatory Visit (INDEPENDENT_AMBULATORY_CARE_PROVIDER_SITE_OTHER): Payer: Medicare Other | Admitting: Family Medicine

## 2019-10-26 ENCOUNTER — Other Ambulatory Visit: Payer: Self-pay

## 2019-10-26 VITALS — BP 120/74 | HR 70 | Temp 97.4°F | Wt 195.2 lb

## 2019-10-26 DIAGNOSIS — I10 Essential (primary) hypertension: Secondary | ICD-10-CM

## 2019-10-26 DIAGNOSIS — M79621 Pain in right upper arm: Secondary | ICD-10-CM | POA: Diagnosis not present

## 2019-10-26 DIAGNOSIS — R200 Anesthesia of skin: Secondary | ICD-10-CM | POA: Diagnosis not present

## 2019-10-26 DIAGNOSIS — E7800 Pure hypercholesterolemia, unspecified: Secondary | ICD-10-CM | POA: Diagnosis not present

## 2019-10-26 DIAGNOSIS — M7521 Bicipital tendinitis, right shoulder: Secondary | ICD-10-CM

## 2019-10-26 LAB — CBC WITH DIFFERENTIAL/PLATELET
Basophils Absolute: 0 10*3/uL (ref 0.0–0.2)
Basos: 1 %
EOS (ABSOLUTE): 0.1 10*3/uL (ref 0.0–0.4)
Eos: 3 %
Hematocrit: 41.6 % (ref 34.0–46.6)
Hemoglobin: 14.1 g/dL (ref 11.1–15.9)
Immature Grans (Abs): 0 10*3/uL (ref 0.0–0.1)
Immature Granulocytes: 0 %
Lymphocytes Absolute: 1.5 10*3/uL (ref 0.7–3.1)
Lymphs: 44 %
MCH: 31 pg (ref 26.6–33.0)
MCHC: 33.9 g/dL (ref 31.5–35.7)
MCV: 91 fL (ref 79–97)
Monocytes Absolute: 0.3 10*3/uL (ref 0.1–0.9)
Monocytes: 9 %
Neutrophils Absolute: 1.4 10*3/uL (ref 1.4–7.0)
Neutrophils: 43 %
Platelets: 293 10*3/uL (ref 150–450)
RBC: 4.55 x10E6/uL (ref 3.77–5.28)
RDW: 12 % (ref 11.7–15.4)
WBC: 3.3 10*3/uL — ABNORMAL LOW (ref 3.4–10.8)

## 2019-10-26 LAB — COMPREHENSIVE METABOLIC PANEL
ALT: 11 IU/L (ref 0–32)
AST: 22 IU/L (ref 0–40)
Albumin/Globulin Ratio: 1.5 (ref 1.2–2.2)
Albumin: 4.1 g/dL (ref 3.8–4.8)
Alkaline Phosphatase: 53 IU/L (ref 48–121)
BUN/Creatinine Ratio: 18 (ref 12–28)
BUN: 17 mg/dL (ref 8–27)
Bilirubin Total: 0.4 mg/dL (ref 0.0–1.2)
CO2: 24 mmol/L (ref 20–29)
Calcium: 9.5 mg/dL (ref 8.7–10.3)
Chloride: 105 mmol/L (ref 96–106)
Creatinine, Ser: 0.97 mg/dL (ref 0.57–1.00)
GFR calc Af Amer: 71 mL/min/{1.73_m2} (ref >59)
GFR calc non Af Amer: 61 mL/min/{1.73_m2} (ref >59)
Globulin, Total: 2.8 g/dL (ref 1.5–4.5)
Glucose: 91 mg/dL (ref 65–99)
Potassium: 4.5 mmol/L (ref 3.5–5.2)
Sodium: 141 mmol/L (ref 134–144)
Total Protein: 6.9 g/dL (ref 6.0–8.5)

## 2019-10-26 LAB — LIPID PANEL
Chol/HDL Ratio: 2.6 ratio (ref 0.0–4.4)
Cholesterol, Total: 237 mg/dL — ABNORMAL HIGH (ref 100–199)
HDL: 92 mg/dL (ref >39)
LDL Chol Calc (NIH): 132 mg/dL — ABNORMAL HIGH (ref 0–99)
Triglycerides: 75 mg/dL (ref 0–149)
VLDL Cholesterol Cal: 13 mg/dL (ref 5–40)

## 2019-10-26 NOTE — Patient Instructions (Signed)
Rest your arm and shoulder.   Use ice or heat.   You may take ibuprofen or Tylenol arthritis over the next week.   You can also use a topical pain medication such as Salon Pas, Biofreeze, etc.   Follow up with your orthopedist if this is not improving.     Tendinitis  Tendinitis is swelling (inflammation) of a tendon. A tendon is a cord of tissue that connects muscle to bone. Tendinitis can cause pain, tenderness, and swelling. What are the causes?  Using a tendon or muscle too much (overuse). This is a common cause.  Wear and tear that happens as you age.  Injury.  Some medical conditions, such as arthritis.  Some medicines. What increases the risk? You are more likely to get this condition if you do activities that involve the same movements over and over again (repetitive motions). What are the signs or symptoms?  Pain.  Tenderness.  Mild swelling.  Decreased range of motion. How is this treated? This condition is usually treated with RICE therapy. RICE stands for:  Rest.  Ice.  Compression. This means putting pressure on the affected area.  Elevation. This means raising the affected area above the level of your heart. Treatment may also include:  Medicines for swelling or pain.  Exercises or physical therapy.  A brace or splint.  Surgery. This is rarely needed. Follow these instructions at home: If you have a splint or brace:  Wear the splint or brace as told by your doctor. Remove it only as told by your doctor.  Loosen the splint or brace if your fingers or toes: ? Tingle. ? Become numb. ? Turn cold and blue.  Keep the splint or brace clean.  If the splint or brace is not waterproof: ? Do not let it get wet. ? Cover it with a watertight covering when you take a bath or shower. Managing pain, stiffness, and swelling      If told, put ice on the affected area. ? If you have a removable splint or brace, remove it as told by your  doctor. ? Put ice in a plastic bag. ? Place a towel between your skin and the bag. ? Leave the ice on for 20 minutes, 2-3 times a day.  Move the fingers or toes of the affected arm or leg often, if this applies. This helps to prevent stiffness and to lessen swelling.  If told, raise the affected area above the level of your heart while you are sitting or lying down.  If told, put heat on the affected area before you exercise. Use the heat source that your doctor recommends, such as a moist heat pack or a heating pad. ? Place a towel between your skin and the heat source. ? Leave the heat on for 20-30 minutes. ? Remove the heat if your skin turns bright red. This is very important if you are unable to feel pain, heat, or cold. You may have a greater risk of getting burned. Driving  Do not drive or use heavy machinery while taking prescription pain medicine.  Ask your doctor when it is safe to drive if you have a splint or brace on any part of your arm or leg. Activity  Rest the affected area as told by your doctor.  Return to your normal activities as told by your doctor. Ask your doctor what activities are safe for you.  Avoid using the affected area while you have symptoms.  Do exercises  as told by your doctor. General instructions  If you have a splint, do not put pressure on any part of the splint until it is fully hardened. This may take several hours.  Wear an elastic bandage or pressure (compression) wrap only as told by your doctor.  Take over-the-counter and prescription medicines only as told by your doctor.  Keep all follow-up visits as told by your doctor. This is important. Contact a doctor if:  You do not get better.  You get new problems, such as numbness in your hands, and you do not know why. Summary  Tendinitis is swelling (inflammation) of a tendon.  You are more likely to get this condition if you do activities that involve the same movements over and  over again.  This condition is usually treated with RICE therapy. RICE stands for rest, ice, compression, and elevate.  Avoid using the affected area while you have symptoms. This information is not intended to replace advice given to you by your health care provider. Make sure you discuss any questions you have with your health care provider. Document Revised: 10/07/2017 Document Reviewed: 08/20/2017 Elsevier Patient Education  [place].

## 2019-10-26 NOTE — Progress Notes (Signed)
   Subjective:    Patient ID: Connie Porter, female    DOB: 01-21-1956, 64 y.o.   MRN: [id number]  HPI Chief Complaint  Patient presents with  . hand numbness    hand numbness. not sure if she hurt herself at gym. pain in upper arm and goes down to 3 fingers on right hand   Complains of a 3-4 week history of right upper arm pain that radiates down the posterior arm. States her 4th and 5 th fingers are numb. No injury but she has been going to the gym and doing a lot of upper body weights, has increased her weights and reps. Denies neck pain. No weakness.   She took ibuprofen finally yesterday after getting home from her gym and workout.   No other arthralgias or myalgias.   Denies fever, chills, dizziness, chest pain, palpitations, shortness of breath, abdominal pain, N/V/D, urinary symptoms, LE edema.   States she is fasting and would like to have her cholesterol checked. Taking statin daily. Increased dose at her previous visit in January.   Hx of HTN- has not been on medication for years. BP always in goal range. Controlled with diet and exercise.   Reviewed allergies, medications, past medical, surgical, family, and social history.    Review of Systems Pertinent positives and negatives in the history of present illness.     Objective:   Physical Exam Constitutional:      General: She is not in acute distress.    Appearance: Normal appearance. She is not ill-appearing.  Musculoskeletal:     Right shoulder: Normal.     Left shoulder: Normal.     Right upper arm: Tenderness present.     Right elbow: Normal.     Right wrist: Normal.     Right hand: No tenderness. Normal range of motion. Normal strength. Normal sensation. Normal capillary refill. Normal pulse.       Arms:     Cervical back: Normal range of motion and neck supple.  Neurological:     Mental Status: She is alert and oriented to person, place, and time.     Cranial Nerves: Cranial nerves are intact.      Sensory: Sensation is intact.     Motor: Motor function is intact.    BP 120/74   Pulse 70   Temp (!) 97.4 F (36.3 C)   Wt 195 lb 3.2 oz (88.5 kg)   BMI 30.57 kg/m         Assessment & Plan:  Tendinitis of upper biceps tendon of right shoulder -she will rest her arm, avoid upper body exercises at her gym, take ibuprofen or Tylenol, use topical analgesic, ice or heat and follow up with her orthopedist if not improving.   Pain in right upper arm -conservative treatment discussed.   Numbness of fingers -normal sensation, ROM and strength on exam. Hopefully this will improve with rest and conservative treatment. She will follow up with ortho if not.   Pure hypercholesterolemia - Plan: Lipid panel -continue statin therapy  Essential hypertension - Plan: Comprehensive metabolic panel, CBC with Differential/Platelet -controlled with diet and exercise. No medication needed

## 2019-10-28 ENCOUNTER — Other Ambulatory Visit: Payer: Self-pay | Admitting: Family Medicine

## 2019-10-28 ENCOUNTER — Encounter: Payer: Self-pay | Admitting: Family Medicine

## 2019-10-28 DIAGNOSIS — E7800 Pure hypercholesterolemia, unspecified: Secondary | ICD-10-CM

## 2019-10-28 MED ORDER — ATORVASTATIN CALCIUM 20 MG PO TABS: 20.0000 mg | ORAL_TABLET | Freq: Every day | ORAL | 1 refills | Status: DC

## 2019-11-04 DIAGNOSIS — M5412 Radiculopathy, cervical region: Secondary | ICD-10-CM | POA: Diagnosis not present

## 2019-11-04 DIAGNOSIS — M9901 Segmental and somatic dysfunction of cervical region: Secondary | ICD-10-CM | POA: Diagnosis not present

## 2019-11-04 DIAGNOSIS — M5032 Other cervical disc degeneration, mid-cervical region, unspecified level: Secondary | ICD-10-CM | POA: Diagnosis not present

## 2019-11-04 DIAGNOSIS — M542 Cervicalgia: Secondary | ICD-10-CM | POA: Diagnosis not present

## 2019-11-09 DIAGNOSIS — M5032 Other cervical disc degeneration, mid-cervical region, unspecified level: Secondary | ICD-10-CM | POA: Diagnosis not present

## 2019-11-09 DIAGNOSIS — M5412 Radiculopathy, cervical region: Secondary | ICD-10-CM | POA: Diagnosis not present

## 2019-11-09 DIAGNOSIS — M9901 Segmental and somatic dysfunction of cervical region: Secondary | ICD-10-CM | POA: Diagnosis not present

## 2019-11-09 DIAGNOSIS — M542 Cervicalgia: Secondary | ICD-10-CM | POA: Diagnosis not present

## 2019-11-11 DIAGNOSIS — M9901 Segmental and somatic dysfunction of cervical region: Secondary | ICD-10-CM | POA: Diagnosis not present

## 2019-11-11 DIAGNOSIS — M5032 Other cervical disc degeneration, mid-cervical region, unspecified level: Secondary | ICD-10-CM | POA: Diagnosis not present

## 2019-11-11 DIAGNOSIS — M542 Cervicalgia: Secondary | ICD-10-CM | POA: Diagnosis not present

## 2019-11-11 DIAGNOSIS — M5412 Radiculopathy, cervical region: Secondary | ICD-10-CM | POA: Diagnosis not present

## 2019-11-15 DIAGNOSIS — M5412 Radiculopathy, cervical region: Secondary | ICD-10-CM | POA: Diagnosis not present

## 2019-11-15 DIAGNOSIS — M9901 Segmental and somatic dysfunction of cervical region: Secondary | ICD-10-CM | POA: Diagnosis not present

## 2019-11-15 DIAGNOSIS — M5032 Other cervical disc degeneration, mid-cervical region, unspecified level: Secondary | ICD-10-CM | POA: Diagnosis not present

## 2019-11-15 DIAGNOSIS — M542 Cervicalgia: Secondary | ICD-10-CM | POA: Diagnosis not present

## 2019-11-23 DIAGNOSIS — M9901 Segmental and somatic dysfunction of cervical region: Secondary | ICD-10-CM | POA: Diagnosis not present

## 2019-11-23 DIAGNOSIS — M5412 Radiculopathy, cervical region: Secondary | ICD-10-CM | POA: Diagnosis not present

## 2019-11-23 DIAGNOSIS — M542 Cervicalgia: Secondary | ICD-10-CM | POA: Diagnosis not present

## 2019-11-23 DIAGNOSIS — M5032 Other cervical disc degeneration, mid-cervical region, unspecified level: Secondary | ICD-10-CM | POA: Diagnosis not present

## 2019-11-25 DIAGNOSIS — M9901 Segmental and somatic dysfunction of cervical region: Secondary | ICD-10-CM | POA: Diagnosis not present

## 2019-11-25 DIAGNOSIS — M5412 Radiculopathy, cervical region: Secondary | ICD-10-CM | POA: Diagnosis not present

## 2019-11-25 DIAGNOSIS — M542 Cervicalgia: Secondary | ICD-10-CM | POA: Diagnosis not present

## 2019-11-25 DIAGNOSIS — M5032 Other cervical disc degeneration, mid-cervical region, unspecified level: Secondary | ICD-10-CM | POA: Diagnosis not present

## 2019-11-30 ENCOUNTER — Encounter: Payer: Self-pay | Admitting: Gastroenterology

## 2019-11-30 ENCOUNTER — Other Ambulatory Visit (INDEPENDENT_AMBULATORY_CARE_PROVIDER_SITE_OTHER): Payer: BC Managed Care – PPO

## 2019-11-30 ENCOUNTER — Ambulatory Visit (INDEPENDENT_AMBULATORY_CARE_PROVIDER_SITE_OTHER): Payer: BC Managed Care – PPO | Admitting: Gastroenterology

## 2019-11-30 VITALS — BP 130/82 | HR 71 | Ht 67.0 in | Wt 194.0 lb

## 2019-11-30 DIAGNOSIS — K5080 Crohn's disease of both small and large intestine without complications: Secondary | ICD-10-CM

## 2019-11-30 DIAGNOSIS — M5412 Radiculopathy, cervical region: Secondary | ICD-10-CM | POA: Diagnosis not present

## 2019-11-30 DIAGNOSIS — M9901 Segmental and somatic dysfunction of cervical region: Secondary | ICD-10-CM | POA: Diagnosis not present

## 2019-11-30 DIAGNOSIS — M5032 Other cervical disc degeneration, mid-cervical region, unspecified level: Secondary | ICD-10-CM | POA: Diagnosis not present

## 2019-11-30 DIAGNOSIS — M542 Cervicalgia: Secondary | ICD-10-CM | POA: Diagnosis not present

## 2019-11-30 LAB — COMPREHENSIVE METABOLIC PANEL
ALT: 13 U/L (ref 0–35)
AST: 19 U/L (ref 0–37)
Albumin: 4.2 g/dL (ref 3.5–5.2)
Alkaline Phosphatase: 46 U/L (ref 39–117)
BUN: 15 mg/dL (ref 6–23)
CO2: 30 mEq/L (ref 19–32)
Calcium: 9.8 mg/dL (ref 8.4–10.5)
Chloride: 105 mEq/L (ref 96–112)
Creatinine, Ser: 0.92 mg/dL (ref 0.40–1.20)
GFR: 61.24 mL/min (ref >60.00)
Glucose, Bld: 98 mg/dL (ref 70–99)
Potassium: 3.9 mEq/L (ref 3.5–5.1)
Sodium: 140 mEq/L (ref 135–145)
Total Bilirubin: 0.5 mg/dL (ref 0.2–1.2)
Total Protein: 7.3 g/dL (ref 6.0–8.3)

## 2019-11-30 LAB — CBC
HCT: 41.3 % (ref 36.0–46.0)
Hemoglobin: 13.9 g/dL (ref 12.0–15.0)
MCHC: 33.7 g/dL (ref 30.0–36.0)
MCV: 92.2 fl (ref 78.0–100.0)
Platelets: 268 10*3/uL (ref 150.0–400.0)
RBC: 4.47 Mil/uL (ref 3.87–5.11)
RDW: 12.9 % (ref 11.5–15.5)
WBC: 4.9 10*3/uL (ref 4.0–10.5)

## 2019-11-30 NOTE — Progress Notes (Signed)
Review of pertinent gastrointestinal problems: 1.Crohn's ileocolitis:Workup at [facility]land KitchenMarland [facility] IBD Serology 7 testing 02/2008 "pattern consistent with IBD.Marland KitchenMarland KitchenCrohn's."Colonoscopy 10/2009showed terminal ileum and right colon ulcerations, pathology c/w acute and chronic ileitis, colitis, also HP polyps. Colonoscopy Path report from 2006 showed HP polyps. Office note 05/2009 documented previous C. diff colitis (treated empirically and also after toxin +). 2012 doing well on oral mesalamine 3.6 gms/day. ? Worsening of disease 03/2013, c. Diff PCR neg; increased mesalamine to 4.8gm daily. Colonoscopy 1/2038fund essentially normal colon but moderate to severe ileitis (path confirmed acute and chronic inflammation), started on 2074mazathiaprine daily Had erythema nodosum 2014, early, improved with steroids. 11/2012 doing very well on 200 azathiaprine daily. 04/2013: urgency, straining: felt to be more functional then IBD related; fiber supplements stopped. 12/2013 doing well on 200 azathiaprine daily. 03/2015 MRI enterograophy"mild wall thickening in TI and cecum...active crohn's disease", elevated inflammatory markers and so azathiaprine dose increased to 25058maily; Decreased back to 200m41mily due to blood counts, LFTs.  Started humira 11/2016 40mg69m  Labs 10/2016: TB quant gold negative, hep B S Ag negative, Hep B S Ab positive  TB quant gold 11/2018 negative 2. Dysphasia:EGD August, 2011fou18fchatzki's ring that was dilated to 20 mm, small hiatal hernia, tortuous esophagus. Swallowing much improved. March 2012 PPI only every other day. Repeat EGD 12/2014for dysphagia showed 4cn HH, tortuous distal esophagus, Schatzki's ring that was dilated to 20mm. 37mhrombosed external hemorrhoid2016, this was around the time of constipation related to pain medicine after a knee surgery. Resolve with conservative therapy 4. C. Diff + PCR 12/2016;diarrhea; treated with 2 week oral vanc course;diarrhea  significantly improved after the antibiotics.   HPI: This is a very pleasant 64 year52ld woman whom I last saw 1 year ago.  She is here with her 5-year-60ld and 7-year-50ld granddaughters.  She is feeling great.  She has no troubles with diarrhea or abdominal pains.  Her weight is up about 6 pounds since her last visit here 1 year ago.  She just got back from the beach and admits she was overeating a bit.  She plans to bring it back down  She is on Humira every other week and the logistics seem to work out quite well for her.   ROS: complete GI ROS as described in HPI, all other review negative.  Constitutional:  No unintentional weight loss   Past Medical History:  Diagnosis Date  . Crohn's disease (HCC)   Watermanental crowns present   . High cholesterol   . History of hiatal hernia   . HTN (hypertension) 04/27/2018  . Osteoarthritis of knee, unilateral 09/2014   right   . 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 Seasonal allergies     Past Surgical History:  Procedure Laterality Date  . APPENDECTOMY    . CESAREAN SECTION     x 3  . CHOLECYSTECTOMY    . COLONOSCOPY    . ESOPHAGOGASTRODUODENOSCOPY (EGD) WITH ESOPHAGEAL DILATION     x 2  . PATELLA-FEMORAL ARTHROPLASTY Right 10/07/2014   Procedure: RIGHT PATELLA-FEMORAL ARTHROPLASTY;  Surgeon: Joshua Marchia BondLocation: [facility] CD[facility] Quoinice: Orthopedics;  Laterality: Right;  . TONSILLECTOMY AND ADENOIDECTOMY    . WISDOM TOOTH EXTRACTION      Current Outpatient Medications  Medication Sig Dispense Refill  . atorvastatin (LIPITOR) 20 MG tablet Take 20 mg by mouth daily.    . azelaMarland Kitchentine (ASTELIN) 0.1 % nasal spray     . CALCIUM CITRATE  PO Take 1 tablet by mouth 2 (two) times daily.    . Cholecalciferol (VITAMIN D-3 PO) Take 1 capsule by mouth at bedtime.    . Cyanocobalamin (VITAMIN B-12 PO) Take 1 tablet by mouth daily.    Marland Kitchen DYMISTA 137-50 MCG/ACT SUSP Place 1 spray into the nose as needed.  6  .  fexofenadine (ALLEGRA) 180 MG tablet Take 180 mg by mouth daily.    . Iron-Folic Acid-Vit K80 (IRON FORMULA PO) Take by mouth.    . levocetirizine (XYZAL) 5 MG tablet Take 5 mg by mouth every evening.    . montelukast (SINGULAIR) 10 MG tablet Take 10 mg by mouth at bedtime.    . Multiple Vitamin (MULITIVITAMIN WITH MINERALS) TABS Take 1 tablet by mouth 2 (two) times daily.    . Omega-3 Fatty Acids (FISH OIL PO) Take 2 capsules by mouth 2 (two) times daily.     . Adalimumab (HUMIRA PEN) 40 MG/0.8ML PNKT Inject 40 mg into the skin every 14 (fourteen) days. 6 each 3   No current facility-administered medications for this visit.    Allergies as of 11/30/2019  . (No Known Allergies)    Family History  Problem Relation Age of Onset  . Heart disease Father 42  . Hyperlipidemia Father   . Hypertension Father   . Heart attack Father   . Heart disease Brother 58       MI  . Arthritis Mother   . Dementia Mother   . Asthma Son   . Stroke Maternal Grandfather   . Parkinson's disease Brother   . Colon cancer Neg Hx   . Esophageal cancer Neg Hx   . Pancreatic cancer Neg Hx   . Stomach cancer Neg Hx   . Liver disease Neg Hx     Social History   Socioeconomic History  . Marital status: Married    Spouse name: Not on file  . Number of children: 3  . Years of education: Not on file  . Highest education level: Not on file  Occupational History  . Occupation: Retired Pharmacist, hospital  Tobacco Use  . Smoking status: Never Smoker  . Smokeless tobacco: Never Used  Vaping Use  . Vaping Use: Never used  Substance and Sexual Activity  . Alcohol use: Yes    Comment: weekends  . Drug use: No  . Sexual activity: Yes    Birth control/protection: Post-menopausal  Other Topics Concern  . Not on file  Social History Narrative  . Not on file   Social Determinants of Health   Financial Resource Strain:   . Difficulty of Paying Living Expenses:   Food Insecurity:   . Worried About Sales executive in the Last Year:   . Arboriculturist in the Last Year:   Transportation Needs:   . Film/video editor (Medical):   Marland Kitchen Lack of Transportation (Non-Medical):   Physical Activity:   . Days of Exercise per Week:   . Minutes of Exercise per Session:   Stress:   . Feeling of Stress :   Social Connections:   . Frequency of Communication with Friends and Family:   . Frequency of Social Gatherings with Friends and Family:   . Attends Religious Services:   . Active Member of Clubs or Organizations:   . Attends Archivist Meetings:   Marland Kitchen Marital Status:   Intimate Partner Violence:   . Fear of Current or Ex-Partner:   . Emotionally Abused:   .  Physically Abused:   . Sexually Abused:      Physical Exam: Ht 5' 7"  (1.702 m)   Wt 194 lb (88 kg)   BMI 30.38 kg/m  Constitutional: generally well-appearing Psychiatric: alert and oriented x3 Abdomen: soft, nontender, nondistended, no obvious ascites, no peritoneal signs, normal bowel sounds No peripheral edema noted in lower extremities  Assessment and plan: 64 y.o. female with Crohn's ileocolitis  She is doing very well clinically.  She will stay on Humira monotherapy 40 mg every other week.  She needs repeat labs including TB testing and basic electrolytes, CBC.  I will have those checked today.  She will return to see me in 1 year and sooner if any troubles.  Please see the "Patient Instructions" section for addition details about the plan.  Owens Loffler, MD [facility] 11/30/2019, 8:49 AM   Total time on date of encounter was 20 minutes (this included time spent preparing to see the patient reviewing records; obtaining and/or reviewing separately obtained history; performing a medically appropriate exam and/or evaluation; counseling and educating the patient and family if present; ordering medications, tests or procedures if applicable; and documenting clinical information in the health record).

## 2019-11-30 NOTE — Patient Instructions (Addendum)
If you are age 65 or older, your body mass index should be between 23-30. Your Body mass index is 30.38 kg/m. If this is out of the aforementioned range listed, please consider follow up with your Primary Care Provider.  If you are age 58 or younger, your body mass index should be between 19-25. Your Body mass index is 30.38 kg/m. If this is out of the aformentioned range listed, please consider follow up with your Primary Care Provider.   Your provider has requested that you go to the basement level for lab work before leaving today. Press "B" on the elevator. The lab is located at the first door on the left as you exit the elevator.  Due to recent changes in healthcare laws, you may see the results of your imaging and laboratory studies on MyChart before your provider has had a chance to review them.  We understand that in some cases there may be results that are confusing or concerning to you. Not all laboratory results come back in the same time frame and the provider may be waiting for multiple results in order to interpret others.  Please give [place] 48 hours in order for your provider to thoroughly review all the results before contacting the office for clarification of your results.   Please continue Humira.  You will be seen in the office in 1 year for follow up. (August 2022)  Thank you for entrusting me with your care and choosing [facility].  Dr Ardis Hughs

## 2019-12-02 DIAGNOSIS — M5032 Other cervical disc degeneration, mid-cervical region, unspecified level: Secondary | ICD-10-CM | POA: Diagnosis not present

## 2019-12-02 DIAGNOSIS — M9901 Segmental and somatic dysfunction of cervical region: Secondary | ICD-10-CM | POA: Diagnosis not present

## 2019-12-02 DIAGNOSIS — M542 Cervicalgia: Secondary | ICD-10-CM | POA: Diagnosis not present

## 2019-12-02 DIAGNOSIS — M5412 Radiculopathy, cervical region: Secondary | ICD-10-CM | POA: Diagnosis not present

## 2019-12-03 ENCOUNTER — Ambulatory Visit: Payer: Managed Care, Other (non HMO) | Admitting: Gastroenterology

## 2019-12-03 LAB — QUANTIFERON-TB GOLD PLUS
Mitogen-NIL: 7.09 IU/mL
NIL: 0.03 IU/mL
QuantiFERON-TB Gold Plus: NEGATIVE
TB1-NIL: 0 IU/mL
TB2-NIL: 0 IU/mL

## 2019-12-07 DIAGNOSIS — M9901 Segmental and somatic dysfunction of cervical region: Secondary | ICD-10-CM | POA: Diagnosis not present

## 2019-12-07 DIAGNOSIS — M542 Cervicalgia: Secondary | ICD-10-CM | POA: Diagnosis not present

## 2019-12-07 DIAGNOSIS — M5032 Other cervical disc degeneration, mid-cervical region, unspecified level: Secondary | ICD-10-CM | POA: Diagnosis not present

## 2019-12-07 DIAGNOSIS — M5412 Radiculopathy, cervical region: Secondary | ICD-10-CM | POA: Diagnosis not present

## 2019-12-14 DIAGNOSIS — M542 Cervicalgia: Secondary | ICD-10-CM | POA: Diagnosis not present

## 2019-12-14 DIAGNOSIS — M5412 Radiculopathy, cervical region: Secondary | ICD-10-CM | POA: Diagnosis not present

## 2019-12-14 DIAGNOSIS — M5032 Other cervical disc degeneration, mid-cervical region, unspecified level: Secondary | ICD-10-CM | POA: Diagnosis not present

## 2019-12-14 DIAGNOSIS — M9901 Segmental and somatic dysfunction of cervical region: Secondary | ICD-10-CM | POA: Diagnosis not present

## 2020-01-05 ENCOUNTER — Telehealth: Payer: Self-pay | Admitting: Family Medicine

## 2020-01-05 DIAGNOSIS — E7800 Pure hypercholesterolemia, unspecified: Secondary | ICD-10-CM

## 2020-01-05 NOTE — Telephone Encounter (Signed)
Please check into this and let me know when she is coming in. Is this for a CPE with me or what is the purpose? Thanks.

## 2020-01-05 NOTE — Telephone Encounter (Signed)
Ok to check lipids and CMP then please.

## 2020-01-05 NOTE — Telephone Encounter (Signed)
Put in future orders

## 2020-01-05 NOTE — Telephone Encounter (Signed)
Pt called and states she was to have fasting labs. It is documented in chart but no orders are in system. Please place orders.

## 2020-01-05 NOTE — Telephone Encounter (Signed)
Pt is just coming in for labs. Back in July she was started on cholesterol and you said to return 6-8 weeks

## 2020-01-11 ENCOUNTER — Other Ambulatory Visit: Payer: Self-pay

## 2020-01-11 ENCOUNTER — Other Ambulatory Visit (INDEPENDENT_AMBULATORY_CARE_PROVIDER_SITE_OTHER): Payer: Medicare Other

## 2020-01-11 DIAGNOSIS — Z23 Encounter for immunization: Secondary | ICD-10-CM

## 2020-01-11 DIAGNOSIS — E7800 Pure hypercholesterolemia, unspecified: Secondary | ICD-10-CM

## 2020-01-12 LAB — LIPID PANEL
Chol/HDL Ratio: 2.2 ratio (ref 0.0–4.4)
Cholesterol, Total: 232 mg/dL — ABNORMAL HIGH (ref 100–199)
HDL: 106 mg/dL (ref >39)
LDL Chol Calc (NIH): 114 mg/dL — ABNORMAL HIGH (ref 0–99)
Triglycerides: 70 mg/dL (ref 0–149)
VLDL Cholesterol Cal: 12 mg/dL (ref 5–40)

## 2020-01-12 LAB — COMPREHENSIVE METABOLIC PANEL
ALT: 11 IU/L (ref 0–32)
AST: 21 IU/L (ref 0–40)
Albumin/Globulin Ratio: 1.5 (ref 1.2–2.2)
Albumin: 4.3 g/dL (ref 3.8–4.8)
Alkaline Phosphatase: 54 IU/L (ref 44–121)
BUN/Creatinine Ratio: 17 (ref 12–28)
BUN: 15 mg/dL (ref 8–27)
Bilirubin Total: 0.4 mg/dL (ref 0.0–1.2)
CO2: 25 mmol/L (ref 20–29)
Calcium: 9.7 mg/dL (ref 8.7–10.3)
Chloride: 105 mmol/L (ref 96–106)
Creatinine, Ser: 0.87 mg/dL (ref 0.57–1.00)
GFR calc Af Amer: 81 mL/min/{1.73_m2} (ref >59)
GFR calc non Af Amer: 70 mL/min/{1.73_m2} (ref >59)
Globulin, Total: 2.8 g/dL (ref 1.5–4.5)
Glucose: 91 mg/dL (ref 65–99)
Potassium: 4.1 mmol/L (ref 3.5–5.2)
Sodium: 145 mmol/L — ABNORMAL HIGH (ref 134–144)
Total Protein: 7.1 g/dL (ref 6.0–8.5)

## 2020-02-21 ENCOUNTER — Telehealth: Payer: Self-pay

## 2020-02-21 MED ORDER — ATORVASTATIN CALCIUM 20 MG PO TABS: 20.0000 mg | ORAL_TABLET | Freq: Every day | ORAL | 2 refills | Status: DC

## 2020-02-21 NOTE — Telephone Encounter (Signed)
done

## 2020-02-21 NOTE — Telephone Encounter (Signed)
Pt left message needs refills Atorvastatin

## 2020-04-24 ENCOUNTER — Other Ambulatory Visit: Payer: Self-pay | Admitting: Family Medicine

## 2020-06-16 ENCOUNTER — Telehealth: Payer: Self-pay | Admitting: Gastroenterology

## 2020-06-16 ENCOUNTER — Other Ambulatory Visit: Payer: Self-pay

## 2020-06-16 MED ORDER — HUMIRA PEN 40 MG/0.8ML ~~LOC~~ PNKT: 40.0000 mg | PEN_INJECTOR | SUBCUTANEOUS | 0 refills | Status: DC

## 2020-06-16 NOTE — Telephone Encounter (Signed)
Refill sent as requested. 

## 2020-06-16 NOTE — Telephone Encounter (Signed)
Inbound call from [place] requesting additional refills for Humira.

## 2020-06-19 ENCOUNTER — Other Ambulatory Visit: Payer: Self-pay | Admitting: Family Medicine

## 2020-06-20 ENCOUNTER — Other Ambulatory Visit: Payer: Self-pay | Admitting: Family Medicine

## 2020-07-19 ENCOUNTER — Other Ambulatory Visit: Payer: Self-pay | Admitting: Family Medicine

## 2020-07-19 NOTE — Telephone Encounter (Signed)
Pt has appt in june

## 2020-07-21 ENCOUNTER — Encounter: Payer: Self-pay | Admitting: Internal Medicine

## 2020-08-03 ENCOUNTER — Telehealth: Payer: Self-pay

## 2020-08-03 NOTE — Telephone Encounter (Signed)
[facility] called to say that the pt needs prior auth for Humira.  He did offer to complete over the phone.  Questions were answered and Humira has been approved for 1 year.  Case # [id number] Phone number [phone]. [facility] will call the pt and make her aware.

## 2020-09-28 ENCOUNTER — Encounter: Payer: Self-pay | Admitting: Family Medicine

## 2020-09-28 ENCOUNTER — Ambulatory Visit (INDEPENDENT_AMBULATORY_CARE_PROVIDER_SITE_OTHER): Payer: BC Managed Care – PPO | Admitting: Family Medicine

## 2020-09-28 ENCOUNTER — Other Ambulatory Visit: Payer: Self-pay

## 2020-09-28 VITALS — BP 120/72 | HR 77 | Ht 67.0 in | Wt 216.8 lb

## 2020-09-28 DIAGNOSIS — Z7189 Other specified counseling: Secondary | ICD-10-CM

## 2020-09-28 DIAGNOSIS — Z8249 Family history of ischemic heart disease and other diseases of the circulatory system: Secondary | ICD-10-CM | POA: Diagnosis not present

## 2020-09-28 DIAGNOSIS — K5080 Crohn's disease of both small and large intestine without complications: Secondary | ICD-10-CM

## 2020-09-28 DIAGNOSIS — Z1329 Encounter for screening for other suspected endocrine disorder: Secondary | ICD-10-CM

## 2020-09-28 DIAGNOSIS — J3089 Other allergic rhinitis: Secondary | ICD-10-CM

## 2020-09-28 DIAGNOSIS — Z0000 Encounter for general adult medical examination without abnormal findings: Secondary | ICD-10-CM

## 2020-09-28 DIAGNOSIS — E7800 Pure hypercholesterolemia, unspecified: Secondary | ICD-10-CM | POA: Diagnosis not present

## 2020-09-28 DIAGNOSIS — R9431 Abnormal electrocardiogram [ECG] [EKG]: Secondary | ICD-10-CM

## 2020-09-28 NOTE — Progress Notes (Signed)
Connie Porter is a 65 y.o. female who presents for annual wellness visit, CPE and follow-up on chronic medical conditions.  She has the following concerns:  She had Covid vaccines- 4 shots due to Crohns  Shingrix vaccine- [facility] [place]   Right knee pain in knee she had a partial knee replacement. Seeing orthopedist tomorrow.   She went to [facility] for weight loss but is no longer going.    Immunization History  Administered Date(s) Administered   Fluad Quad(high Dose 65+) 01/11/2020   Influenza Inj Mdck Quad Pf 01/31/2017   Influenza,inj,Quad PF,6+ Mos 01/13/2014, 02/13/2015, 02/23/2016, 01/19/2018, 01/16/2019   Influenza-Unspecified 01/31/2017, 01/19/2018   PFIZER(Purple Top)SARS-COV-2 Vaccination 07/04/2019, 12/23/2019, 07/19/2020   Pneumococcal Conjugate-13 03/19/2019   Pneumococcal Polysaccharide-23 08/25/2012   Td 09/15/2003   Tdap 02/13/2015   Zoster Recombinat (Shingrix) 04/20/2018   Zoster, Live 02/21/2015   Last Pap smear: due this year with obgyn, scheduled for July  Last mammogram: due this year and scheduled  Last colonoscopy:2014 Last DEXA: 2019 and normal  [facility]  Ophtho: Dr. Nicki Reaper- [facility] Exercise: 3 days a week gym for 2 hours. Weight lifting and cardio. Walks the other days.   Other doctors caring for patient include: Dr. Matthew Saras- Obgyn Dr. Ardis Hughs- Gertie Fey Dr. Karin Lieu- ortho Dr. Enrique Sack- Derm [facility]- Dr. Geoffry Paradise  Dr. Donneta Romberg- allergist   Depression screen:  See questionnaire below.  Depression screen [facility] 2/9 09/28/2020 03/19/2019 10/23/2017 02/13/2015 05/02/2014  Decreased Interest 0 0 0 0 0  Down, Depressed, Hopeless 0 0 0 0 0  PHQ - 2 Score 0 0 0 0 0    Fall Risk Screen: see questionnaire below. Fall Risk  09/28/2020 03/19/2019 10/23/2017 02/13/2015 05/02/2014  Falls in the past year? 0 0 No No No  Number falls in past yr: 0 0 - - -  Injury with Fall? 0 0 - - -  Risk for fall due to : No Fall Risks -  - - Other (Comment)  Follow up Falls evaluation completed - - - -    ADL screen:  See questionnaire below Functional Status Survey: Is the patient deaf or have difficulty hearing?: No Does the patient have difficulty seeing, even when wearing glasses/contacts?: No Does the patient have difficulty concentrating, remembering, or making decisions?: No Does the patient have difficulty walking or climbing stairs?: No Does the patient have difficulty dressing or bathing?: No Does the patient have difficulty doing errands alone such as visiting a doctor's office or shopping?: No   End of Life Discussion:  Patient  does not have advance directives at this time but she and her spouse have an appointment to discuss with a Chief Executive Officer. MOst form filled out   Review of Systems Constitutional: -fever, -chills, -sweats, -unexpected weight change, -anorexia, -fatigue Allergy: -sneezing, -itching, -congestion Dermatology: denies changing moles, rash, lumps, new worrisome lesions ENT: -runny nose, -ear pain, -sore throat, -hoarseness, -sinus pain, -teeth pain, -tinnitus, -hearing loss, -epistaxis Cardiology:  -chest pain, -palpitations, -edema, -orthopnea, -paroxysmal nocturnal dyspnea Respiratory: -cough, -shortness of breath, -dyspnea on exertion, -wheezing, -hemoptysis Gastroenterology: -abdominal pain, -nausea, -vomiting, -diarrhea, -constipation, -blood in stool, -changes in bowel movement, -dysphagia Hematology: -bleeding or bruising problems Musculoskeletal: -arthralgias, -myalgias, -joint swelling, -back pain, -neck pain, -cramping, -gait changes Ophthalmology: -vision changes, -eye redness, -itching, -discharge Urology: -dysuria, -difficulty urinating, -hematuria, -urinary frequency, -urgency, incontinence Neurology: -headache, -weakness, -tingling, -numbness, -speech abnormality, -memory loss, -falls, -dizziness Psychology:  -depressed mood, -agitation, -sleep problems  PHYSICAL EXAM:  BP  120/72   Pulse 77   Ht 5' 7"  (1.702 m)   Wt 216 lb 12.8 oz (98.3 kg)   BMI 33.96 kg/m   General Appearance: Alert, cooperative, no distress, appears stated age Head: Normocephalic, without obvious abnormality, atraumatic Eyes: PERRL, conjunctiva/corneas clear, EOM's intact Ears: Normal TM's and external ear canals Neck: Supple, no lymphadenopathy; thyroid: no enlargement/tenderness/nodules; no JVD Back: Spine nontender, no curvature, ROM normal, no CVA tenderness Lungs: Clear to auscultation bilaterally without wheezes, rales or ronchi; respirations unlabored Chest Wall: No tenderness or deformity Heart: Regular rate and rhythm, S1 and S2 normal, no murmur, rub or gallop Breast Exam: gyn Abdomen: Soft, non-tender, nondistended, normoactive bowel sounds, no masses, no hepatosplenomegaly Genitalia: gyn Extremities: No clubbing, cyanosis or edema Pulses: 2+ and symmetric all extremities Skin: Skin color, texture, turgor normal Lymph nodes: Cervical, supraclavicular nodes normal Neurologic: CNII-XII intact, normal strength, sensation and gait  Psych: Normal mood, affect, hygiene and grooming.  ASSESSMENT/PLAN: Medicare annual wellness visit, initial  Routine general medical examination at a health care facility - Plan: CBC with Differential/Platelet, Comprehensive metabolic panel, TSH, T4, free, Lipid panel, EKG 12-Lead -Preventive health care reviewed.  She sees her gynecologist.  Regularly followed by GI.  Counseling on healthy lifestyle including diet and exercise.  She is in good spirits and no sign of depression.  Immunizations reviewed.  Discussed safety and health promotion.  Pure hypercholesterolemia - Plan: Lipid panel, EKG 12-Lead, Ambulatory referral to Cardiology -Continue statin therapy.  Her LDL has not been in goal range.  Referral to cardiology  Environmental and seasonal allergies -She is on several medications and allergies managed by allergist  Crohn's disease of  both small and large intestine without complication ([place]) -Managed by GI  Family history of heart disease - Plan: EKG 12-Lead, Ambulatory referral to Cardiology  Screening for thyroid disorder - Plan: TSH, T4, free  Advance directive discussed with patient  Abnormal EKG - Plan: Ambulatory referral to Cardiology  EKG shows NSR, rate 69, left axis, flat T waves and nonspecific ST-T changes. Read by myself and Dr. Redmond School.   Discussed monthly self breast exams and yearly mammograms; at least 30 minutes of aerobic activity at least 5 days/week and weight-bearing exercise 2x/week; proper sunscreen use reviewed; healthy diet, including goals of calcium and vitamin D intake and alcohol recommendations (less than or equal to 1 drink/day) reviewed; regular seatbelt use; changing batteries in smoke detectors.  Immunization recommendations discussed.  Colonoscopy recommendations reviewed   Medicare Attestation I have personally reviewed: The patient's medical and social history Their use of alcohol, tobacco or illicit drugs Their current medications and supplements The patient's functional ability including ADLs,fall risks, home safety risks, cognitive, and hearing and visual impairment Diet and physical activities Evidence for depression or mood disorders  The patient's weight, height, and BMI have been recorded in the chart.  I have made referrals, counseling, and provided education to the patient based on review of the above and I have provided the patient with a written personalized care plan for preventive services.     Harland Dingwall, NP-C   09/28/2020

## 2020-09-28 NOTE — Patient Instructions (Addendum)
  Connie Porter , Thank you for taking time to come for your Medicare Wellness Visit. I appreciate your ongoing commitment to your health goals. Please review the following plan we discussed and let me know if I can assist you in the future.   These are the goals we discussed:  Continue taking good care of yourself with a healthy, well balanced diet and getting plenty of physical activity.   We will be in touch with your lab results.     This is a list of the screening recommended for you and due dates:  Health Maintenance  Topic Date Due   COVID-19 Vaccine (1) Never done   HIV Screening  Never done   Zoster (Shingles) Vaccine (2 of 2) 06/15/2018   Pap Smear  09/05/2019   Pneumonia vaccines (2 of 2 - PPSV23) 03/18/2020   Mammogram  09/30/2020   Flu Shot  11/13/2020   Colon Cancer Screening  05/12/2022   Tetanus Vaccine  02/12/2025   DEXA scan (bone density measurement)  Completed   Hepatitis C Screening: USPSTF Recommendation to screen - Ages 18-79 yo.  Completed   HPV Vaccine  Aged Out

## 2020-09-29 ENCOUNTER — Other Ambulatory Visit: Payer: Self-pay | Admitting: Internal Medicine

## 2020-09-29 DIAGNOSIS — M1711 Unilateral primary osteoarthritis, right knee: Secondary | ICD-10-CM | POA: Diagnosis not present

## 2020-09-29 LAB — CBC WITH DIFFERENTIAL/PLATELET
Basophils Absolute: 0.1 10*3/uL (ref 0.0–0.2)
Basos: 1 %
EOS (ABSOLUTE): 0.1 10*3/uL (ref 0.0–0.4)
Eos: 2 %
Hematocrit: 44.5 % (ref 34.0–46.6)
Hemoglobin: 14.7 g/dL (ref 11.1–15.9)
Immature Grans (Abs): 0 10*3/uL (ref 0.0–0.1)
Immature Granulocytes: 0 %
Lymphocytes Absolute: 1.4 10*3/uL (ref 0.7–3.1)
Lymphs: 40 %
MCH: 31.1 pg (ref 26.6–33.0)
MCHC: 33 g/dL (ref 31.5–35.7)
MCV: 94 fL (ref 79–97)
Monocytes Absolute: 0.4 10*3/uL (ref 0.1–0.9)
Monocytes: 10 %
Neutrophils Absolute: 1.6 10*3/uL (ref 1.4–7.0)
Neutrophils: 47 %
Platelets: 271 10*3/uL (ref 150–450)
RBC: 4.72 x10E6/uL (ref 3.77–5.28)
RDW: 11.8 % (ref 11.7–15.4)
WBC: 3.5 10*3/uL (ref 3.4–10.8)

## 2020-09-29 LAB — LIPID PANEL
Chol/HDL Ratio: 2.1 ratio (ref 0.0–4.4)
Cholesterol, Total: 199 mg/dL (ref 100–199)
HDL: 95 mg/dL (ref >39)
LDL Chol Calc (NIH): 91 mg/dL (ref 0–99)
Triglycerides: 74 mg/dL (ref 0–149)
VLDL Cholesterol Cal: 13 mg/dL (ref 5–40)

## 2020-09-29 LAB — COMPREHENSIVE METABOLIC PANEL
ALT: 15 IU/L (ref 0–32)
AST: 19 IU/L (ref 0–40)
Albumin/Globulin Ratio: 1.6 (ref 1.2–2.2)
Albumin: 4.4 g/dL (ref 3.8–4.8)
Alkaline Phosphatase: 61 IU/L (ref 44–121)
BUN/Creatinine Ratio: 18 (ref 12–28)
BUN: 16 mg/dL (ref 8–27)
Bilirubin Total: 0.4 mg/dL (ref 0.0–1.2)
CO2: 21 mmol/L (ref 20–29)
Calcium: 9.6 mg/dL (ref 8.7–10.3)
Chloride: 104 mmol/L (ref 96–106)
Creatinine, Ser: 0.87 mg/dL (ref 0.57–1.00)
Globulin, Total: 2.7 g/dL (ref 1.5–4.5)
Glucose: 98 mg/dL (ref 65–99)
Potassium: 4 mmol/L (ref 3.5–5.2)
Sodium: 139 mmol/L (ref 134–144)
Total Protein: 7.1 g/dL (ref 6.0–8.5)
eGFR: 74 mL/min/{1.73_m2} (ref >59)

## 2020-09-29 LAB — TSH: TSH: 0.824 u[IU]/mL (ref 0.450–4.500)

## 2020-09-29 LAB — T4, FREE: Free T4: 1.34 ng/dL (ref 0.82–1.77)

## 2020-09-29 MED ORDER — ATORVASTATIN CALCIUM 20 MG PO TABS: ORAL_TABLET | ORAL | 1 refills | Status: DC

## 2020-10-30 DIAGNOSIS — Z01419 Encounter for gynecological examination (general) (routine) without abnormal findings: Secondary | ICD-10-CM | POA: Diagnosis not present

## 2020-10-30 DIAGNOSIS — Z6835 Body mass index (BMI) 35.0-35.9, adult: Secondary | ICD-10-CM | POA: Diagnosis not present

## 2020-10-30 DIAGNOSIS — Z1231 Encounter for screening mammogram for malignant neoplasm of breast: Secondary | ICD-10-CM | POA: Diagnosis not present

## 2020-10-30 LAB — HM MAMMOGRAPHY

## 2020-11-15 DIAGNOSIS — H353131 Nonexudative age-related macular degeneration, bilateral, early dry stage: Secondary | ICD-10-CM | POA: Diagnosis not present

## 2020-11-17 ENCOUNTER — Encounter: Payer: Self-pay | Admitting: Internal Medicine

## 2020-11-21 ENCOUNTER — Encounter: Payer: Self-pay | Admitting: Family Medicine

## 2020-12-14 ENCOUNTER — Encounter: Payer: Self-pay | Admitting: Cardiovascular Disease

## 2020-12-14 NOTE — Progress Notes (Signed)
Cardiology Office Note:    Date:  12/15/2020   ID:  Connie Porter, DOB May 15, 1955, MRN [id number]  PCP:  Girtha Rm, NP-C   [facility] Providers Cardiologist:  Blondell Laperle Click to update primary MD,subspecialty MD or APP then REFRESH:1}    Referring MD: Girtha Rm, NP-C   Chief Complaint  Patient presents with   Chest Pain    HSept. 2, 2022   Connie Porter is a 65 y.o. female with a hx of chest pain.  We were asked to see her today by Harland Dingwall NP for further evaluation of her chest pain and hyperlipidemia.  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 , special ed for 30 year, [place])   Has Crohns disease , takes Humira   Past Medical History:  Diagnosis Date   Crohn's disease ([place])    Dental crowns present    High cholesterol    History of hiatal hernia    HTN (hypertension) 04/27/2018   Osteoarthritis of knee, unilateral 09/2014   right    Patellofemoral arthritis of right knee 10/07/2014   Seasonal allergies     Past Surgical History:  Procedure Laterality Date   APPENDECTOMY     CESAREAN SECTION     x 3   CHOLECYSTECTOMY     COLONOSCOPY     ESOPHAGOGASTRODUODENOSCOPY (EGD) WITH ESOPHAGEAL DILATION     x 2   PATELLA-FEMORAL ARTHROPLASTY Right 10/07/2014   Procedure: RIGHT PATELLA-FEMORAL ARTHROPLASTY;  Surgeon: Marchia Bond, MD;  Location: [facility];  Service: Orthopedics;  Laterality: Right;   TONSILLECTOMY AND ADENOIDECTOMY     WISDOM TOOTH EXTRACTION      Current Medications: Current Meds  Medication Sig   Adalimumab (HUMIRA PEN) 40 MG/0.8ML PNKT    atorvastatin (LIPITOR) 20 MG tablet TAKE 1 TABLET(20 MG) BY MOUTH DAILY   azelastine (ASTELIN) 0.1 % nasal spray    CALCIUM CITRATE PO  Take 1 tablet by mouth 2 (two) times daily.   Cholecalciferol (VITAMIN D-3 PO) Take 1 capsule by mouth at bedtime.   Cyanocobalamin (VITAMIN B-12 PO) Take 1 tablet by mouth daily.   DYMISTA 137-50 MCG/ACT SUSP Place 1 spray into the nose as needed.   fexofenadine (ALLEGRA) 180 MG tablet Take 180 mg by mouth daily.   fluticasone (CUTIVATE) 0.005 % ointment    fluticasone (FLONASE) 50 MCG/ACT nasal spray Place 1-2 sprays into both nostrils daily.   Iron-Folic Acid-Vit X21 (IRON FORMULA PO) Take by mouth.   levocetirizine (XYZAL) 5 MG tablet Take 5 mg by mouth every evening.   metoprolol tartrate (LOPRESSOR) 100 MG tablet Take 1 tablet (100 mg total) by mouth once for 1 dose. Take 1 tablet (100 mg total) 2 hours prior to CT scan.   montelukast (SINGULAIR) 10 MG tablet Take 10 mg by mouth at bedtime.   Multiple Vitamin (MULITIVITAMIN WITH MINERALS) TABS Take 1 tablet by mouth 2 (two) times daily.   Omega-3 Fatty Acids (FISH OIL PO) Take 2 capsules by mouth 2 (two) times daily.      Allergies:   Patient has no known allergies.   Social History   Socioeconomic History   Marital  status: Married    Spouse name: Not on file   Number of children: 3   Years of education: Not on file   Highest education level: Not on file  Occupational History   Occupation: Retired Pharmacist, hospital  Tobacco Use   Smoking status: Never   Smokeless tobacco: Never  Vaping Use   Vaping Use: Never used  Substance and Sexual Activity   Alcohol use: Yes    Comment: weekends   Drug use: No   Sexual activity: Yes    Birth control/protection: Post-menopausal  Other Topics Concern   Not on file  Social History Narrative   Not on file   Social Determinants of Health   Financial Resource Strain: Not on file  Food Insecurity: Not on file  Transportation Needs: Not on file  Physical Activity: Not on file  Stress: Not on file  Social Connections: Not on file     Family History: The patient's family history includes  Arthritis in her mother; Asthma in her son; Dementia in her mother; Heart attack in her father; Heart disease (age of onset: 8) in her brother; Heart disease (age of onset: 48) in her father; Hyperlipidemia in her father; Hypertension in her father; Parkinson's disease in her brother; Stroke in her maternal grandfather. There is no history of Colon cancer, Esophageal cancer, Pancreatic cancer, Stomach cancer, or Liver disease.  ROS:   Please see the history of present illness.     All other systems reviewed and are negative.  EKGs/Labs/Other Studies Reviewed:    The following studies were reviewed today:   EKG:  September 28, 2020:  NSR . NS ST / T wave changes.      Recent Labs: 09/28/2020: ALT 15; BUN 16; Creatinine, Ser 0.87; Hemoglobin 14.7; Platelets 271; Potassium 4.0; Sodium 139; TSH 0.824  Recent Lipid Panel    Component Value Date/Time   CHOL 199 09/28/2020 1018   TRIG 74 09/28/2020 1018   HDL 95 09/28/2020 1018   CHOLHDL 2.1 09/28/2020 1018   CHOLHDL 2.2 02/07/2017 1128   VLDL 10 02/23/2016 1056   LDLCALC 91 09/28/2020 1018   LDLCALC 95 02/07/2017 1128     Risk Assessment/Calculations:           Physical Exam:    VS:  BP 126/80   Pulse 78   Ht 5' 7"  (1.702 m)   Wt 221 lb 6.4 oz (100.4 kg)   SpO2 96%   BMI 34.68 kg/m     Wt Readings from Last 3 Encounters:  12/15/20 221 lb 6.4 oz (100.4 kg)  09/28/20 216 lb 12.8 oz (98.3 kg)  11/30/19 194 lb (88 kg)     GEN:  Well nourished, well developed in no acute distress HEENT: Normal NECK: No JVD; No carotid bruits LYMPHATICS: No lymphadenopathy CARDIAC: RRR, no murmurs, rubs, gallops RESPIRATORY:  Clear to auscultation without rales, wheezing or rhonchi  ABDOMEN: Soft, non-tender, non-distended MUSCULOSKELETAL:  No edema; No deformity  SKIN: Warm and dry NEUROLOGIC:  Alert and oriented x 3 PSYCHIATRIC:  Normal affect   ASSESSMENT:    1. Precordial pain    PLAN:    In order of problems listed  above:  Exertional chest discomfort: Connie Porter presents for further evaluation of some exertional chest discomfort.  She has a history of hyperlipidemia and has a very strong family history of coronary artery disease.   She has nonspecific ST-T wave changes on her EKG.  I like to do a coronary CT angiogram for further  evaluation of this chest pain and her history of hyperlipidemia.   She will continue to exercise.  2.  Hyperlipidemia: Last LDL was 91.  If she is found to have coronary artery disease then will need to be more aggressive with her lipid-lowering therapy as her goal would be between 74 and 70.       Medication Adjustments/Labs and Tests Ordered: Current medicines are reviewed at length with the patient today.  Concerns regarding medicines are outlined above.  Orders Placed This Encounter  Procedures   CT CORONARY MORPH W/CTA COR W/SCORE W/CA W/CM &/OR WO/CM   Basic metabolic panel   Meds ordered this encounter  Medications   metoprolol tartrate (LOPRESSOR) 100 MG tablet    Sig: Take 1 tablet (100 mg total) by mouth once for 1 dose. Take 1 tablet (100 mg total) 2 hours prior to CT scan.    Dispense:  1 tablet    Refill:  0    Patient Instructions  Medication Instructions:  Your physician recommends that you continue on your current medications as directed. Please refer to the Current Medication list given to you today.  *If you need a refill on your cardiac medications before your next appointment, please call your pharmacy*   Lab Work: TODAY: BMET If you have labs (blood work) drawn today and your tests are completely normal, you will receive your results only by: [place] (if you have MyChart) OR A paper copy in the mail If you have any lab test that is abnormal or we need to change your treatment, we will call you to review the results.   Testing/Procedures: Your provider has recommended that you have a coronary CTA scan. Please see below for further  instructions.   Follow-Up: At [facility], you and your health needs are our priority.  As part of our continuing mission to provide you with exceptional heart care, we have created designated Provider Care Teams.  These Care Teams include your primary Cardiologist (physician) and Advanced Practice Providers (APPs -  Physician Assistants and Nurse Practitioners) who all work together to provide you with the care you need, when you need it.   Your next appointment:   3 month(s)  The format for your next appointment:   In Person  Provider:   You may see Mertie Moores, MD or one of the following Advanced Practice Providers on your designated Care Team:   Richardson Dopp, PA-C [place], [place]   Other Instructions   Your cardiac CT will be scheduled at one of the below locations:   [facility] [address] [place], [place] [address] [phone]  Please arrive at the [facility] main entrance (entrance A) of [facility] 30 minutes prior to test start time. Proceed to the [facility] Radiology Department (first floor) to check-in and test prep.  Please follow these instructions carefully (unless otherwise directed):  On the Night Before the Test: Be sure to Drink plenty of water. Do not consume any caffeinated/decaffeinated beverages or chocolate 12 hours prior to your test. Do not take any antihistamines 12 hours prior to your test.  On the Day of the Test: Drink plenty of water until 1 hour prior to the test. Do not eat any food 4 hours prior to the test. You may take your regular medications prior to the test.  Take metoprolol (Lopressor) two hours prior to test. FEMALES- please wear underwire-free bra if available, avoid dresses & tight clothing  After the Test: Drink plenty of water. After receiving IV contrast, you may experience a mild flushed feeling. This is normal. On occasion, you may experience a mild rash up to 24 hours after the  test. This is not dangerous. If this occurs, you can take Benadryl 25 mg and increase your fluid intake. If you experience trouble breathing, this can be serious. If it is severe call 911 IMMEDIATELY. If it is mild, please call our office.  Please allow 2-4 weeks for scheduling of routine cardiac CTs. Some insurance companies require a pre-authorization which may delay scheduling of this test.   For non-scheduling related questions, please contact the cardiac imaging nurse navigator should you have any questions/concerns: Marchia Bond, Cardiac Imaging Nurse Navigator Gordy Clement, Cardiac Imaging Nurse Navigator [facility] Direct Office Dial: [phone]   For scheduling needs, including cancellations and rescheduling, please call [place], [phone].    Signed, Mertie Moores, MD  12/15/2020 10:31 AM    [place]

## 2020-12-15 ENCOUNTER — Encounter: Payer: Self-pay | Admitting: Cardiovascular Disease

## 2020-12-15 ENCOUNTER — Other Ambulatory Visit: Payer: Self-pay

## 2020-12-15 ENCOUNTER — Ambulatory Visit (INDEPENDENT_AMBULATORY_CARE_PROVIDER_SITE_OTHER): Payer: BC Managed Care – PPO | Admitting: Cardiovascular Disease

## 2020-12-15 VITALS — BP 126/80 | HR 78 | Ht 67.0 in | Wt 221.4 lb

## 2020-12-15 DIAGNOSIS — R072 Precordial pain: Secondary | ICD-10-CM | POA: Diagnosis not present

## 2020-12-15 LAB — BASIC METABOLIC PANEL
BUN/Creatinine Ratio: 24 (ref 12–28)
BUN: 21 mg/dL (ref 8–27)
CO2: 25 mmol/L (ref 20–29)
Calcium: 10 mg/dL (ref 8.7–10.3)
Chloride: 102 mmol/L (ref 96–106)
Creatinine, Ser: 0.89 mg/dL (ref 0.57–1.00)
Glucose: 90 mg/dL (ref 65–99)
Potassium: 4.4 mmol/L (ref 3.5–5.2)
Sodium: 140 mmol/L (ref 134–144)
eGFR: 71 mL/min/{1.73_m2} (ref >59)

## 2020-12-15 MED ORDER — METOPROLOL TARTRATE 100 MG PO TABS: 100.0000 mg | ORAL_TABLET | Freq: Once | ORAL | 0 refills | Status: DC

## 2020-12-15 NOTE — Patient Instructions (Addendum)
Medication Instructions:  Your physician recommends that you continue on your current medications as directed. Please refer to the Current Medication list given to you today.  *If you need a refill on your cardiac medications before your next appointment, please call your pharmacy*   Lab Work: TODAY: BMET If you have labs (blood work) drawn today and your tests are completely normal, you will receive your results only by: [place] (if you have MyChart) OR A paper copy in the mail If you have any lab test that is abnormal or we need to change your treatment, we will call you to review the results.   Testing/Procedures: Your provider has recommended that you have a coronary CTA scan. Please see below for further instructions.   Follow-Up: At [facility], you and your health needs are our priority.  As part of our continuing mission to provide you with exceptional heart care, we have created designated Provider Care Teams.  These Care Teams include your primary Cardiologist (physician) and Advanced Practice Providers (APPs -  Physician Assistants and Nurse Practitioners) who all work together to provide you with the care you need, when you need it.   Your next appointment:   3 month(s)  The format for your next appointment:   In Person  Provider:   You may see Connie Moores, MD or one of the following Advanced Practice Providers on your designated Care Team:   Richardson Dopp, PA-C [place], [place]   Other Instructions   Your cardiac CT will be scheduled at one of the below locations:   [facility] [address] [place], [place] [address] [phone]  Please arrive at the [facility] main entrance (entrance A) of [facility] 30 minutes prior to test start time. Proceed to the [facility] Radiology Department (first floor) to check-in and test prep.  Please follow these instructions carefully (unless otherwise directed):  On the Night Before  the Test: Be sure to Drink plenty of water. Do not consume any caffeinated/decaffeinated beverages or chocolate 12 hours prior to your test. Do not take any antihistamines 12 hours prior to your test.  On the Day of the Test: Drink plenty of water until 1 hour prior to the test. Do not eat any food 4 hours prior to the test. You may take your regular medications prior to the test.  Take metoprolol (Lopressor) two hours prior to test. FEMALES- please wear underwire-free bra if available, avoid dresses & tight clothing       After the Test: Drink plenty of water. After receiving IV contrast, you may experience a mild flushed feeling. This is normal. On occasion, you may experience a mild rash up to 24 hours after the test. This is not dangerous. If this occurs, you can take Benadryl 25 mg and increase your fluid intake. If you experience trouble breathing, this can be serious. If it is severe call 911 IMMEDIATELY. If it is mild, please call our office.  Please allow 2-4 weeks for scheduling of routine cardiac CTs. Some insurance companies require a pre-authorization which may delay scheduling of this test.   For non-scheduling related questions, please contact the cardiac imaging nurse navigator should you have any questions/concerns: Marchia Bond, Cardiac Imaging Nurse Navigator Gordy Clement, Cardiac Imaging Nurse Navigator [facility] Direct Office Dial: [phone]   For scheduling needs, including cancellations and rescheduling, please call [place], [phone].

## 2020-12-19 ENCOUNTER — Other Ambulatory Visit: Payer: Self-pay

## 2020-12-19 MED ORDER — HUMIRA PEN 40 MG/0.8ML ~~LOC~~ PNKT: 40.0000 mg | PEN_INJECTOR | SUBCUTANEOUS | 6 refills | Status: DC

## 2020-12-20 ENCOUNTER — Other Ambulatory Visit: Payer: Self-pay

## 2020-12-20 ENCOUNTER — Telehealth (HOSPITAL_COMMUNITY): Payer: Self-pay | Admitting: Emergency Medicine

## 2020-12-20 MED ORDER — HUMIRA PEN 40 MG/0.8ML ~~LOC~~ PNKT: 40.0000 mg | PEN_INJECTOR | SUBCUTANEOUS | 6 refills | Status: DC

## 2020-12-20 NOTE — Telephone Encounter (Signed)
Reaching out to patient to offer assistance regarding upcoming cardiac imaging study; pt verbalizes understanding of appt date/time, parking situation and where to check in, pre-test NPO status and medications ordered, and verified current allergies; name and call back number provided for further questions should they arise Marchia Bond RN Navigator Cardiac Imaging [facility] [phone] office [phone] cell  Difficult IV Denies claustro 182m metop

## 2020-12-22 ENCOUNTER — Ambulatory Visit (HOSPITAL_COMMUNITY)
Admission: RE | Admit: 2020-12-22 | Discharge: 2020-12-22 | Disposition: A | Discharge status: Home / Self Care | Payer: BC Managed Care – PPO | Source: Ambulatory Visit | Attending: Cardiovascular Disease | Admitting: Cardiovascular Disease

## 2020-12-22 ENCOUNTER — Other Ambulatory Visit: Payer: Self-pay

## 2020-12-22 DIAGNOSIS — R072 Precordial pain: Secondary | ICD-10-CM | POA: Insufficient documentation

## 2020-12-22 DIAGNOSIS — Z006 Encounter for examination for normal comparison and control in clinical research program: Secondary | ICD-10-CM

## 2020-12-22 MED ORDER — NITROGLYCERIN 0.4 MG SL SUBL
0.8000 mg | SUBLINGUAL_TABLET | Freq: Once | SUBLINGUAL | Status: AC
  Administered 2020-12-22: 0.8 mg via SUBLINGUAL

## 2020-12-22 MED ORDER — NITROGLYCERIN 0.4 MG SL SUBL
SUBLINGUAL_TABLET | SUBLINGUAL | Status: AC
  Filled 2020-12-22: qty 2

## 2020-12-22 MED ORDER — IOHEXOL 350 MG/ML SOLN
100.0000 mL | Freq: Once | INTRAVENOUS | Status: AC | PRN
  Administered 2020-12-22: 100 mL via INTRAVENOUS

## 2020-12-22 NOTE — Research (Signed)
IDENTIFY Informed Consent                  Subject Name: Connie Porter   Subject met inclusion and exclusion criteria.  The informed consent form, study requirements and expectations were reviewed with the subject and questions and concerns were addressed prior to the signing of the consent form.  The subject verbalized understanding of the trial requirements.  The subject agreed to participate in the IDENTIFY trial and signed the informed consent at 1523PM on 12/22/20.  The informed consent was obtained prior to performance of any protocol-specific procedures for the subject.  A copy of the signed informed consent was given to the subject and a copy was placed in the subject's medical record.    Brenton Grills, Research Assistant

## 2020-12-25 ENCOUNTER — Telehealth: Payer: Self-pay

## 2020-12-25 DIAGNOSIS — J301 Allergic rhinitis due to pollen: Secondary | ICD-10-CM | POA: Diagnosis not present

## 2020-12-25 DIAGNOSIS — E7800 Pure hypercholesterolemia, unspecified: Secondary | ICD-10-CM

## 2020-12-25 DIAGNOSIS — K219 Gastro-esophageal reflux disease without esophagitis: Secondary | ICD-10-CM | POA: Diagnosis not present

## 2020-12-25 MED ORDER — ROSUVASTATIN CALCIUM 20 MG PO TABS: 20.0000 mg | ORAL_TABLET | Freq: Every day | ORAL | 3 refills | Status: DC

## 2020-12-25 NOTE — Telephone Encounter (Signed)
-----   Message from Thayer Headings, MD sent at 12/25/2020  5:52 AM EDT ----- Coronary calcium score is 5, which places the patient in the 54th percentile for age and sex matched control  She has mild CAD.     LDL is 91 - her goal is now 50-70 since she has some CAD Please DC atorvastatin , Start Rosuvastatin 20 mg a day.  Lipids, ALT in 3 months,  She also has borderline aortic dilatation.   Cont weight loss efforts,  maintain control of her BP .   We will discuss further at her next appt.

## 2020-12-25 NOTE — Telephone Encounter (Signed)
The patient has been notified of the result and verbalized understanding.  All questions (if any) were answered. Ewell Poe [place], RN 12/25/2020 5:02 PM   Placed order for medication changes and lab work.

## 2020-12-26 ENCOUNTER — Telehealth: Payer: Self-pay

## 2020-12-26 ENCOUNTER — Ambulatory Visit (INDEPENDENT_AMBULATORY_CARE_PROVIDER_SITE_OTHER): Payer: BC Managed Care – PPO | Admitting: Gastroenterology

## 2020-12-26 ENCOUNTER — Other Ambulatory Visit: Payer: Self-pay

## 2020-12-26 ENCOUNTER — Telehealth: Payer: Self-pay | Admitting: Cardiovascular Disease

## 2020-12-26 ENCOUNTER — Encounter: Payer: Self-pay | Admitting: Gastroenterology

## 2020-12-26 VITALS — BP 138/94 | HR 75 | Ht 67.0 in | Wt 222.0 lb

## 2020-12-26 DIAGNOSIS — K5080 Crohn's disease of both small and large intestine without complications: Secondary | ICD-10-CM | POA: Diagnosis not present

## 2020-12-26 NOTE — Patient Instructions (Signed)
If you are age 66 or older, your body mass index should be between 23-30. Your Body mass index is 34.77 kg/m. If this is out of the aforementioned range listed, please consider follow up with your Primary Care Provider.  The [facility] providers would like to encourage you to use [facility] to communicate with providers for non-urgent requests or questions.  Due to long hold times on the telephone, sending your provider a message by [facility] may be a faster and more efficient way to get a response.  Please allow 48 business hours for a response.  Please remember that this is for non-urgent requests.   We will send refills for Humira.  You will be seen in our office in 1 year (September 2023).  We will contact you to schedule this appointment.  Please call our office you have trouble with your bowels.  Thank you for entrusting me with your care and choosing [facility].  Dr Ardis Hughs

## 2020-12-26 NOTE — Progress Notes (Signed)
Review of pertinent gastrointestinal problems: 1.Crohn's ileocolitis: Workup at [facility].Marland KitchenMarland KitchenPromethius IBD Serology 7 testing 02/2008 "pattern consistent with IBD.Marland KitchenMarland KitchenCrohn's." Colonoscopy 01/2008 showed terminal ileum and right colon ulcerations, pathology c/w acute and chronic ileitis, colitis, also HP polyps. Colonoscopy Path report from 2006 showed HP polyps. Office note 05/2009 documented previous C. diff colitis (treated empirically and also after toxin +). 2012 doing well on oral mesalamine 3.6 gms/day. ? Worsening of disease 03/2013, c. Diff PCR neg; increased mesalamine to 4.8gm daily. Colonoscopy 04/2012 found essentially normal colon but moderate to severe ileitis (path confirmed acute and chronic inflammation), started on 234m azathiaprine daily Had erythema nodosum 2014, early, improved with steroids. 11/2012 doing very well on 200 azathiaprine daily. 04/2013: urgency, straining: felt to be more functional then IBD related; fiber supplements stopped.  12/2013 doing well on 200 azathiaprine daily. 03/2015 MRI enterograophy "mild wall thickening in TI and cecum...active crohn's disease", elevated inflammatory markers and so azathiaprine dose increased to 2566mdaily; Decreased back to 20052maily due to blood counts, LFTs. Started humira 11/2016 34m46mW Labs 10/2016: TB quant gold negative, hep B S Ag negative, Hep B S Ab positive TB quant gold 11/2019 negative 2. Dysphasia: EGD August, 2011 found Schatzki's ring that was dilated to 20 mm, small hiatal hernia, tortuous esophagus. Swallowing much improved. March 2012 PPI only every other day. Repeat EGD 03/2013 for dysphagia showed 4cm HH, tortuous distal esophagus, Schatzki's ring that was dilated to 20mm68m Thrombosed external hemorrhoid 2016, this was around the time of constipation related to pain medicine after a knee surgery. Resolve with conservative therapy 4. C. Diff + PCR 12/2016; diarrhea; treated with 2 week oral vanc course; diarrhea  significantly improved after the antibiotics.   HPI: This is a very pleasant 65 ye15 old woman whom I last saw 1 year ago  She has gained 28 pounds since her last office visit here about 1 year ago.  She admits she is not eating as well as she used to.  Twice in the past year she has noticed lower abdominal pains and loose frequent stools starting about 2 days before her scheduled Humira dose.  The symptoms would resolve shortly after she took that dose of Humira.  Otherwise she feels fine.  The last of these episodes was several months ago.  She is planning on going to [place] kids in about 2 weeks.  ROS: complete GI ROS as described in HPI, all other review negative.  Constitutional:  No unintentional weight loss   Past Medical History:  Diagnosis Date   Cro[place] disease [place]HCC[place]    High cholesterol    History of hiatal hernia    HTN (hypertension) 04/27/2018   Osteoarthritis of knee, unilateral 09/2014   right    Patellofemoral arthritis of right knee 10/07/2014   Seasonal allergies     Past Surgical History:  Procedure Laterality Date   APPENDECTOMY     CESAREAN SECTION     x 3   CHOLECYSTECTOMY     COLONOSCOPY     ESOPHAGOGASTRODUODENOSCOPY (EGD) WITH ESOPHAGEAL DILATION     x 2   PATELLA-FEMORAL ARTHROPLASTY Right 10/07/2014   Procedure: RIGHT PATELLA-FEMORAL ARTHROPLASTY;  Surgeon: JoshuMarchia Bond  [place];  Laterality: Right;   TONSILLECTOMY AND ADENOIDECTOMY     WISDOM TOOTH EXTRACTION      Current Outpatient Medications  Medication Sig Dispense Refill   Adalimumab (HUMIRA PEN) 40 MG/0.8ML  PNKT Inject 40 mg into the skin every 14 (fourteen) days. 2 each 6   azelastine (ASTELIN) 0.1 % nasal spray      CALCIUM CITRATE PO Take 1 tablet by mouth 2 (two) times daily.     Cholecalciferol (VITAMIN D-3 PO) Take 1 capsule by mouth at bedtime.     Cyanocobalamin (VITAMIN B-12 PO)  Take 1 tablet by mouth daily.     DYMISTA 137-50 MCG/ACT SUSP Place 1 spray into the nose as needed.  6   fexofenadine (ALLEGRA) 180 MG tablet Take 180 mg by mouth daily.     fluticasone (CUTIVATE) 0.005 % ointment      fluticasone (FLONASE) 50 MCG/ACT nasal spray Place 1-2 sprays into both nostrils daily.     Iron-Folic Acid-Vit Q82 (IRON FORMULA PO) Take by mouth.     levocetirizine (XYZAL) 5 MG tablet Take 5 mg by mouth every evening.     metoprolol tartrate (LOPRESSOR) 100 MG tablet Take 1 tablet (100 mg total) by mouth once for 1 dose. Take 1 tablet (100 mg total) 2 hours prior to CT scan. 1 tablet 0   montelukast (SINGULAIR) 10 MG tablet Take 10 mg by mouth at bedtime.     Multiple Vitamin (MULITIVITAMIN WITH MINERALS) TABS Take 1 tablet by mouth 2 (two) times daily.     Omega-3 Fatty Acids (FISH OIL PO) Take 2 capsules by mouth 2 (two) times daily.      rosuvastatin (CRESTOR) 20 MG tablet Take 1 tablet (20 mg total) by mouth daily. 90 tablet 3   No current facility-administered medications for this visit.    Allergies as of 12/26/2020   (No Known Allergies)    Family History  Problem Relation Age of Onset   Arthritis Mother    Dementia Mother    Heart disease Father 21   Hyperlipidemia Father    Hypertension Father    Heart attack Father    Heart disease Brother 61       MI   Parkinson's disease Brother    Stroke Maternal Grandfather    Asthma Son    Colon cancer Neg Hx    Esophageal cancer Neg Hx    Pancreatic cancer Neg Hx    Stomach cancer Neg Hx    Liver disease Neg Hx     Social History   Socioeconomic History   Marital status: Married    Spouse name: Not on file   Number of children: 3   Years of education: Not on file   Highest education level: Not on file  Occupational History   Occupation: Retired Pharmacist, hospital  Tobacco Use   Smoking status: Never   Smokeless tobacco: Never  Vaping Use   Vaping Use: Never used  Substance and Sexual Activity   Alcohol  use: Yes    Comment: weekends   Drug use: No   Sexual activity: Yes    Birth control/protection: Post-menopausal  Other Topics Concern   Not on file  Social History Narrative   Not on file   Social Determinants of Health   Financial Resource Strain: Not on file  Food Insecurity: Not on file  Transportation Needs: Not on file  Physical Activity: Not on file  Stress: Not on file  Social Connections: Not on file  Intimate Partner Violence: Not on file     Physical Exam: Wt 222 lb (100.7 kg)   BMI 34.77 kg/m  Constitutional: generally well-appearing Psychiatric: alert and oriented x3 Abdomen: soft, nontender, nondistended, no  obvious ascites, no peritoneal signs, normal bowel sounds No peripheral edema noted in lower extremities  Assessment and plan: 65 y.o. female with Crohn's ileocolitis  She is doing overall quite well on Humira every other week, 40 mg.  She did have what sounds like 2 mild flares shortly prior to her Humira dosing in the past year.  That means that 24 out of 26 biweekly injections went by without incident.  I think that is pretty good overall.  She does know that if this starts to become a trend and happens quite reliably that she is having symptoms shortly before her Humira then she should call here.  At that point I would likely arrange blood work to check for Humira related antibodies, therapeutic doses.  Otherwise she should return to see me in 1 year.  She needs refill on her Humira and I am happy to do that for her  Please see the "Patient Instructions" section for addition details about the plan.  Owens Loffler, MD [facility] 12/26/2020, 8:49 AM   Total time on date of encounter was 30 minutes (this included time spent preparing to see the patient reviewing records; obtaining and/or reviewing separately obtained history; performing a medically appropriate exam and/or evaluation; counseling and educating the patient and family if present;  ordering medications, tests or procedures if applicable; and documenting clinical information in the health record).

## 2020-12-26 NOTE — Telephone Encounter (Signed)
Red with [facility] is calling to provide a call report about an addendum on patient's CT.  Please return call to discuss at [phone]. Anyone who answers can provide the report.

## 2020-12-26 NOTE — Telephone Encounter (Signed)
Patient [facility] 6[facility]  She was seen by Dr JArdis Hughstoday, and will be seen in follow up in 1 year.   Thank you

## 2020-12-26 NOTE — Telephone Encounter (Signed)
Refill was sent on 9/7. The pt has been advised.

## 2020-12-26 NOTE — Telephone Encounter (Signed)
Spoke with Red at [place] and he reported an addendum to her 12/22/20 CT:   IMPRESSION: Moderate to large hiatal hernia. No other significant abnormality identified.  Will forward to Dr. Acie Fredrickson for his review.  Next OV 03/16/21.

## 2020-12-27 NOTE — Telephone Encounter (Signed)
Called patient and advised her that Dr. Acie Fredrickson does not want her to start Zetia at this time. When he wrote that note this morning, he later realized that he changed her from atorvastatin to rosuvastatin per these test results on 9/12. She started rosuvastatin 20 mg today as directed and we will reevaluate her lipid and alt in 2 months. She is aware of the finding of the hiatal hernia. Results have been sent to PCP and Dr. Ardis Hughs, GI per patient request. She thanked me for the call.

## 2021-01-29 DIAGNOSIS — Z20822 Contact with and (suspected) exposure to covid-19: Secondary | ICD-10-CM | POA: Diagnosis not present

## 2021-02-20 DIAGNOSIS — L821 Other seborrheic keratosis: Secondary | ICD-10-CM | POA: Diagnosis not present

## 2021-02-20 DIAGNOSIS — L538 Other specified erythematous conditions: Secondary | ICD-10-CM | POA: Diagnosis not present

## 2021-02-20 DIAGNOSIS — R208 Other disturbances of skin sensation: Secondary | ICD-10-CM | POA: Diagnosis not present

## 2021-02-20 DIAGNOSIS — D225 Melanocytic nevi of trunk: Secondary | ICD-10-CM | POA: Diagnosis not present

## 2021-02-20 DIAGNOSIS — D485 Neoplasm of uncertain behavior of skin: Secondary | ICD-10-CM | POA: Diagnosis not present

## 2021-02-20 DIAGNOSIS — L814 Other melanin hyperpigmentation: Secondary | ICD-10-CM | POA: Diagnosis not present

## 2021-03-13 ENCOUNTER — Other Ambulatory Visit: Payer: Self-pay

## 2021-03-13 ENCOUNTER — Other Ambulatory Visit: Payer: BC Managed Care – PPO | Admitting: *Deleted

## 2021-03-13 DIAGNOSIS — E7800 Pure hypercholesterolemia, unspecified: Secondary | ICD-10-CM

## 2021-03-13 LAB — LIPID PANEL
Chol/HDL Ratio: 2.2 ratio (ref 0.0–4.4)
Cholesterol, Total: 211 mg/dL — ABNORMAL HIGH (ref 100–199)
HDL: 95 mg/dL (ref >39)
LDL Chol Calc (NIH): 99 mg/dL (ref 0–99)
Triglycerides: 100 mg/dL (ref 0–149)
VLDL Cholesterol Cal: 17 mg/dL (ref 5–40)

## 2021-03-13 LAB — ALT: ALT: 14 IU/L (ref 0–32)

## 2021-03-15 ENCOUNTER — Telehealth: Payer: Self-pay

## 2021-03-15 DIAGNOSIS — E7800 Pure hypercholesterolemia, unspecified: Secondary | ICD-10-CM

## 2021-03-15 MED ORDER — EZETIMIBE 10 MG PO TABS: 10.0000 mg | ORAL_TABLET | Freq: Every day | ORAL | 3 refills | Status: DC

## 2021-03-15 NOTE — Telephone Encounter (Signed)
Left detailed message.  Advised detailed report available on mychart for review.  Advised to start zetia 10 mg po daily  Advised labs needed in 3 months.  Requested call back if any questions.  Pt has f/u appointment with Dr. Acie Fredrickson 03/16/21

## 2021-03-15 NOTE — Telephone Encounter (Signed)
-----   Message from Thayer Headings, MD sent at 03/15/2021 12:42 PM EST ----- Coronary calcium score is 5, which places the patient in the 54th percentile for age and sex matched control.  Coronary arteries: Normal coronary origins.  Right dominance.  Right Coronary Artery: Minimal mixed atherosclerotic plaque in the mid RCA, <25% stenosis.  Left Main Coronary Artery: Minimal mixed atherosclerotic plaque in the distal left main, <25% stenosis.  Left Anterior Descending Coronary Artery: Minimal mixed atherosclerotic plaque in the ostial LAD, <25% stenosis.  Left Circumflex Artery: No detectable plaque or stenosis.  Aorta: Borderline dilation, 39 mm at the mid ascending aorta (level of the PA bifurcation) measured double oblique. Trivial calcifications. No dissection.  She has very minimal CAD but I think she should try to achieve an LDL of < 70 Add zetia 10 mg a day . Check lipids, alt, bmp in 3 months   She has a large hiatal hernia.   This may be the cause of her chest pain .

## 2021-03-16 ENCOUNTER — Other Ambulatory Visit: Payer: Self-pay

## 2021-03-16 ENCOUNTER — Encounter: Payer: Self-pay | Admitting: Cardiovascular Disease

## 2021-03-16 ENCOUNTER — Ambulatory Visit (INDEPENDENT_AMBULATORY_CARE_PROVIDER_SITE_OTHER): Payer: BC Managed Care – PPO | Admitting: Cardiovascular Disease

## 2021-03-16 VITALS — BP 112/78 | HR 87 | Ht 67.0 in | Wt 224.8 lb

## 2021-03-16 DIAGNOSIS — E782 Mixed hyperlipidemia: Secondary | ICD-10-CM

## 2021-03-16 NOTE — Patient Instructions (Signed)
Medication Instructions:  Your physician recommends that you continue on your current medications as directed. Please refer to the Current Medication list given to you today. TAKE: ezetimibe (Zetia) 10 mg by mouth daily  *If you need a refill on your cardiac medications before your next appointment, please call your pharmacy*   Lab Work: NONE If you have labs (blood work) drawn today and your tests are completely normal, you will receive your results only by: [place] (if you have MyChart) OR A paper copy in the mail If you have any lab test that is abnormal or we need to change your treatment, we will call you to review the results.   Testing/Procedures: NONE   Follow-Up: At [facility], you and your health needs are our priority.  As part of our continuing mission to provide you with exceptional heart care, we have created designated Provider Care Teams.  These Care Teams include your primary Cardiologist (physician) and Advanced Practice Providers (APPs -  Physician Assistants and Nurse Practitioners) who all work together to provide you with the care you need, when you need it.  We recommend signing up for the patient portal called "MyChart".  Sign up information is provided on this After Visit Summary.  MyChart is used to connect with patients for Virtual Visits (Telemedicine).  Patients are able to view lab/test results, encounter notes, upcoming appointments, etc.  Non-urgent messages can be sent to your provider as well.   To learn more about what you can do with MyChart, go to [email or web address].    Your next appointment:   1 year(s)  The format for your next appointment:   In Person  Provider:   Mertie Moores, MD  or Robbie Lis, PA-C or Richardson Dopp, [place]

## 2021-03-16 NOTE — Progress Notes (Signed)
Cardiology Office Note:    Date:  03/16/2021   ID:  Connie Porter, DOB 1955-11-26, MRN [id number]  PCP:  Girtha Rm, PA-C   [facility] Providers Cardiologist:  Kathrynn Humble to update primary MD,subspecialty MD or APP then REFRESH:1}    Referring MD: Girtha Rm, PA-C   Chief Complaint  Patient presents with   Hyperlipidemia   Chest Pain    HSept. 2, 2022   Connie Porter is a 65 y.o. female with a hx of chest pain.  We were asked to see her today by Harland Dingwall NP for further evaluation of her chest pain and hyperlipidemia.  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 2, 2022 Connie Porter is seen for follow up visit  Strong fam hx of CAD Coronary CT angio revealed A large hiatal hernia Coronary calcium score is 5, which places the patient in the 54th percentile for age and sex matched control  Right Coronary Artery: Minimal mixed atherosclerotic plaque in the mid RCA, <25% stenosis.   Left Main Coronary Artery: Minimal mixed atherosclerotic plaque in the distal left main, <25% stenosis.   Left Anterior Descending Coronary Artery: Minimal mixed atherosclerotic plaque in the ostial LAD, <25% stenosis.   Left Circumflex Artery: No detectable plaque or stenosis.   Aorta: Borderline dilation, 39 mm at the mid ascending aorta (level of the PA bifurcation) measured double oblique. Trivial calcifications. No dissection.  Wt is 225 lbs    Past Medical History:  Diagnosis Date   Crohn's disease ([place])    Dental crowns present    High cholesterol    History of hiatal hernia    HTN (hypertension) 04/27/2018   Osteoarthritis of knee, unilateral  09/2014   right    Patellofemoral arthritis of right knee 10/07/2014   Seasonal allergies     Past Surgical History:  Procedure Laterality Date   APPENDECTOMY     CESAREAN SECTION     x 3   CHOLECYSTECTOMY     COLONOSCOPY     ESOPHAGOGASTRODUODENOSCOPY (EGD) WITH ESOPHAGEAL DILATION     x 2   PATELLA-FEMORAL ARTHROPLASTY Right 10/07/2014   Procedure: RIGHT PATELLA-FEMORAL ARTHROPLASTY;  Surgeon: Marchia Bond, MD;  Location: [place];  Service: Orthopedics;  Laterality: Right;   TONSILLECTOMY AND ADENOIDECTOMY     WISDOM TOOTH EXTRACTION      Current Medications: Current Meds  Medication Sig   azelastine (ASTELIN) 0.1 % nasal spray    CALCIUM CITRATE PO Take 1 tablet by mouth 2 (two) times daily.   Cholecalciferol (VITAMIN D-3 PO) Take 1 capsule by mouth at bedtime.   Cyanocobalamin (VITAMIN B-12 PO) Take 1 tablet by mouth daily.   DYMISTA 137-50 MCG/ACT SUSP Place 1 spray into the nose as needed.   ezetimibe (ZETIA) 10 MG tablet Take 1 tablet (10 mg total) by mouth daily.   fexofenadine (ALLEGRA) 180 MG tablet Take 180 mg by mouth daily.   fluticasone (CUTIVATE) 0.005 % ointment    Iron-Folic Acid-Vit K02 (IRON FORMULA PO) Take by mouth.   levocetirizine (XYZAL)  5 MG tablet Take 5 mg by mouth every evening.   montelukast (SINGULAIR) 10 MG tablet Take 10 mg by mouth at bedtime.   Multiple Vitamin (MULITIVITAMIN WITH MINERALS) TABS Take 1 tablet by mouth 2 (two) times daily.   Omega-3 Fatty Acids (FISH OIL PO) Take 2 capsules by mouth 2 (two) times daily.    rosuvastatin (CRESTOR) 20 MG tablet Take 1 tablet (20 mg total) by mouth daily.     Allergies:   Patient has no known allergies.   Social History   Socioeconomic History   Marital status: Married    Spouse name: Not on file   Number of children: 3   Years of education: Not on file   Highest education level: Not on file  Occupational History   Occupation: Retired Pharmacist, hospital  Tobacco Use   Smoking  status: Never   Smokeless tobacco: Never  Vaping Use   Vaping Use: Never used  Substance and Sexual Activity   Alcohol use: Yes    Comment: weekends   Drug use: No   Sexual activity: Yes    Birth control/protection: Post-menopausal  Other Topics Concern   Not on file  Social History Narrative   Not on file   Social Determinants of Health   Financial Resource Strain: Not on file  Food Insecurity: Not on file  Transportation Needs: Not on file  Physical Activity: Not on file  Stress: Not on file  Social Connections: Not on file     Family History: The patient's family history includes Arthritis in her mother; Asthma in her son; Dementia in her mother; Heart attack in her father; Heart disease (age of onset: 19) in her brother; Heart disease (age of onset: 31) in her father; Hyperlipidemia in her father; Hypertension in her father; Parkinson's disease in her brother; Stroke in her maternal grandfather. There is no history of Colon cancer, Esophageal cancer, Pancreatic cancer, Stomach cancer, or Liver disease.  ROS:   Please see the history of present illness.     All other systems reviewed and are negative.  EKGs/Labs/Other Studies Reviewed:    The following studies were reviewed today:   EKG:      Recent Labs: 09/28/2020: Hemoglobin 14.7; Platelets 271; TSH 0.824 12/15/2020: BUN 21; Creatinine, Ser 0.89; Potassium 4.4; Sodium 140 03/13/2021: ALT 14  Recent Lipid Panel    Component Value Date/Time   CHOL 211 (H) 03/13/2021 0723   TRIG 100 03/13/2021 0723   HDL 95 03/13/2021 0723   CHOLHDL 2.2 03/13/2021 0723   CHOLHDL 2.2 02/07/2017 1128   VLDL 10 02/23/2016 1056   LDLCALC 99 03/13/2021 0723   LDLCALC 95 02/07/2017 1128     Risk Assessment/Calculations:           Physical Exam:     Physical Exam: Blood pressure 112/78, pulse 87, height 5' 7"  (1.702 m), weight 224 lb 12.8 oz (102 kg), SpO2 97 %.  GEN:  moderately obese female,  NAD  HEENT: Normal NECK:  No JVD; No carotid bruits LYMPHATICS: No lymphadenopathy CARDIAC: RRR ,  no murmur  RESPIRATORY:  Clear to auscultation without rales, wheezing or rhonchi  ABDOMEN: Soft, non-tender, non-distended MUSCULOSKELETAL:  No edema; No deformity  SKIN: Warm and dry NEUROLOGIC:  Alert and oriented x 3   ASSESSMENT:    No diagnosis found.  PLAN:      Exertional chest discomfort:  Likely due to her large hiatal hernia  Cor CT angio only shows very mnimal CAD  2.  Hyperlipidemia:  Coronary calcium score is 5, which places the patient in the 54th percentile for age and sex matched control Given her strong family hx, I would like to see her LDL < 70.  Last LDL was 99. Add zetia 10 mg a day to her rosuvastatin 20 mg a day  Lipids, ALT,BMP in 3 months    Medication Adjustments/Labs and Tests Ordered: Current medicines are reviewed at length with the patient today.  Concerns regarding medicines are outlined above.  No orders of the defined types were placed in this encounter.  No orders of the defined types were placed in this encounter.   Patient Instructions  Medication Instructions:  Your physician recommends that you continue on your current medications as directed. Please refer to the Current Medication list given to you today. TAKE: ezetimibe (Zetia) 10 mg by mouth daily  *If you need a refill on your cardiac medications before your next appointment, please call your pharmacy*   Lab Work: NONE If you have labs (blood work) drawn today and your tests are completely normal, you will receive your results only by: [place] (if you have MyChart) OR A paper copy in the mail If you have any lab test that is abnormal or we need to change your treatment, we will call you to review the results.   Testing/Procedures: NONE   Follow-Up: At [facility], you and your health needs are our priority.  As part of our continuing mission to provide you with exceptional heart  care, we have created designated Provider Care Teams.  These Care Teams include your primary Cardiologist (physician) and Advanced Practice Providers (APPs -  Physician Assistants and Nurse Practitioners) who all work together to provide you with the care you need, when you need it.  We recommend signing up for the patient portal called "MyChart".  Sign up information is provided on this After Visit Summary.  MyChart is used to connect with patients for Virtual Visits (Telemedicine).  Patients are able to view lab/test results, encounter notes, upcoming appointments, etc.  Non-urgent messages can be sent to your provider as well.   To learn more about what you can do with MyChart, go to [email or web address].    Your next appointment:   1 year(s)  The format for your next appointment:   In Person  Provider:   Mertie Moores, MD  or Robbie Lis, PA-C or Richardson Dopp, PA-C          Signed, Mertie Moores, MD  03/16/2021 1:37 PM    [place]

## 2021-05-21 ENCOUNTER — Telehealth: Payer: Self-pay | Admitting: Gastroenterology

## 2021-05-21 DIAGNOSIS — K5080 Crohn's disease of both small and large intestine without complications: Secondary | ICD-10-CM

## 2021-05-21 NOTE — Telephone Encounter (Signed)
Inbound call from patient states she believes her Crohn's is flaring up. States she is experiencing diarrhea and severe abd pain

## 2021-05-21 NOTE — Telephone Encounter (Signed)
The pt has a history of Crohn's and has done well since September 2022.  Since Thursday she began to have lower mid abd pain and diarrhea with mucous.  No bleeding.  She says she is going about 6 or more times daily.  Her last Humira dose was on Saturday 2/4.  She has responded in the past after her does but this time she has not.  Please advise. Labs? Imaging? Prednisone?

## 2021-05-22 NOTE — Telephone Encounter (Signed)
I spoke with the pt and she agrees to appt tomorrow at 2:50 pm with Dr Ardis Hughs.  She will come in tomorrow morning for labs and pick up stool test.  Bonne Dolores this pt was added on for tomorrow afternoon.

## 2021-05-23 ENCOUNTER — Ambulatory Visit (INDEPENDENT_AMBULATORY_CARE_PROVIDER_SITE_OTHER): Payer: BC Managed Care – PPO | Admitting: Gastroenterology

## 2021-05-23 ENCOUNTER — Other Ambulatory Visit (INDEPENDENT_AMBULATORY_CARE_PROVIDER_SITE_OTHER): Payer: BC Managed Care – PPO

## 2021-05-23 ENCOUNTER — Encounter: Payer: Self-pay | Admitting: Gastroenterology

## 2021-05-23 VITALS — BP 140/80 | HR 68 | Ht 67.0 in | Wt 225.0 lb

## 2021-05-23 DIAGNOSIS — K5080 Crohn's disease of both small and large intestine without complications: Secondary | ICD-10-CM

## 2021-05-23 LAB — CBC WITH DIFFERENTIAL/PLATELET
Basophils Absolute: 0 10*3/uL (ref 0.0–0.1)
Basophils Relative: 0.8 % (ref 0.0–3.0)
Eosinophils Absolute: 0.1 10*3/uL (ref 0.0–0.7)
Eosinophils Relative: 1.9 % (ref 0.0–5.0)
HCT: 39.7 % (ref 36.0–46.0)
Hemoglobin: 13.1 g/dL (ref 12.0–15.0)
Lymphocytes Relative: 31.9 % (ref 12.0–46.0)
Lymphs Abs: 1.6 10*3/uL (ref 0.7–4.0)
MCHC: 33 g/dL (ref 30.0–36.0)
MCV: 91.8 fl (ref 78.0–100.0)
Monocytes Absolute: 0.6 10*3/uL (ref 0.1–1.0)
Monocytes Relative: 11.4 % (ref 3.0–12.0)
Neutro Abs: 2.8 10*3/uL (ref 1.4–7.7)
Neutrophils Relative %: 54 % (ref 43.0–77.0)
Platelets: 279 10*3/uL (ref 150.0–400.0)
RBC: 4.32 Mil/uL (ref 3.87–5.11)
RDW: 12.8 % (ref 11.5–15.5)
WBC: 5.1 10*3/uL (ref 4.0–10.5)

## 2021-05-23 LAB — SEDIMENTATION RATE: Sed Rate: 46 mm/hr — ABNORMAL HIGH (ref 0–30)

## 2021-05-23 MED ORDER — PREDNISONE 20 MG PO TABS: 40.0000 mg | ORAL_TABLET | Freq: Every day | ORAL | 2 refills | Status: DC

## 2021-05-23 NOTE — Progress Notes (Signed)
Review of pertinent gastrointestinal problems: 1.Crohn's ileocolitis: Workup at [facility].Marland KitchenMarland [facility]Prom[facility] IBD Serology 7 testing 02/2008 "pattern consistent with IBD.Marland KitchenMarland KitchenCrohn's." Colonoscopy 01/2008 showed terminal ileum and right colon ulcerations, pathology c/w acute and chronic ileitis, colitis, also HP polyps. Colonoscopy Path report from 2006 showed HP polyps. Office note 05/2009 documented previous C. diff colitis (treated empirically and also after toxin +). 2012 doing well on oral mesalamine 3.6 gms/day. ? Worsening of disease 03/2013, c. Diff PCR neg; increased mesalamine to 4.8gm daily. Colonoscopy 04/2012 found essentially normal colon but moderate to severe ileitis (path confirmed acute and chronic inflammation), started on 275m azathiaprine daily Had erythema nodosum 2014, early, improved with steroids. 11/2012 doing very well on 200 azathiaprine daily. 04/2013: urgency, straining: felt to be more functional then IBD related; fiber supplements stopped.  12/2013 doing well on 200 azathiaprine daily. 03/2015 MRI enterograophy "mild wall thickening in TI and cecum...active crohn's disease", elevated inflammatory markers and so azathiaprine dose increased to 2569mdaily; Decreased back to 20021maily due to blood counts, LFTs. Started humira 11/2016 48m69mW Labs 10/2016: TB quant gold negative, hep B S Ag negative, Hep B S Ab positive TB quant gold 11/2019 negative 2. Dysphasia: EGD August, 2011 found Schatzki's ring that was dilated to 20 mm, small hiatal hernia, tortuous esophagus. Swallowing much improved. March 2012 PPI only every other day. Repeat EGD 03/2013 for dysphagia showed 4cm HH, tortuous distal esophagus, Schatzki's ring that was dilated to 20mm64m Thrombosed external hemorrhoid 2016, this was around the time of constipation related to pain medicine after a knee surgery. Resolve with conservative therapy 4. C. Diff + PCR 12/2016; diarrhea; treated with 2 week oral vanc course; diarrhea  significantly improved after the antibiotics.   HPI: This is a very pleasant 65 ye98 old Connie Porter   I last saw her here in the office about 5 months ago for routine Crohn's follow-up.  At that point she had gained 28 pounds since her previous office visit.  She told me that twice in the past year or so she would have symptoms of frequent loose stools and some abdominal pains starting about 2 days prior to her Humira dose.  The symptoms would resolve very quickly after the upcoming Humira dose.  Her weight at that visit was 222 pounds.  Today she is up 3 pounds from that visit  She called a day or 2 ago with concerns of a possible flare.  We set this appointment up for her and she had lab test done this morning including CBC, complete metabolic profile, sedimentation rate, fecal calprotectin, CRP and GI pathogen panel of her stool.  That was all collected but none of it has been run yet.  1 week ago she began having profuse watery diarrhea and lower abdominal discomforts.  Never bloody.  She is having some mucus discharge.  The pain is low in her abdomen.  She took her usual 40 mg every other week Humira dose 2 days later at its normal schedule and her symptoms have not remitted since then.  She is still bothered by frequency, urgency, nocturnal symptoms.  She is going 8-10 times a day usually and sometimes 15 times a day.  She has had no antibiotics recently and she has had no sick contacts.  She has not started smoking and she has not been taking significant over-the-counter NSAIDs.  ROS: complete GI ROS as described in HPI, all other review negative.  Constitutional:  No unintentional weight loss   Past Medical History:  Diagnosis Date   Crohn's disease ([place])    Dental crowns present    High cholesterol    History of hiatal hernia    HTN (hypertension) 04/27/2018   Osteoarthritis of knee, unilateral 09/2014   right    Patellofemoral arthritis of right knee 10/07/2014   Seasonal allergies      Past Surgical History:  Procedure Laterality Date   APPENDECTOMY     CESAREAN SECTION     x 3   CHOLECYSTECTOMY     COLONOSCOPY     ESOPHAGOGASTRODUODENOSCOPY (EGD) WITH ESOPHAGEAL DILATION     x 2   PATELLA-FEMORAL ARTHROPLASTY Right 10/07/2014   Procedure: RIGHT PATELLA-FEMORAL ARTHROPLASTY;  Surgeon: Marchia Bond, MD;  Location: [facility];  Service: Orthopedics;  Laterality: Right;   TONSILLECTOMY AND ADENOIDECTOMY     WISDOM TOOTH EXTRACTION      Current Outpatient Medications  Medication Sig Dispense Refill   azelastine (ASTELIN) 0.1 % nasal spray      CALCIUM CITRATE PO Take 1 tablet by mouth 2 (two) times daily.     Cholecalciferol (VITAMIN D-3 PO) Take 1 capsule by mouth at bedtime.     Cyanocobalamin (VITAMIN B-12 PO) Take 1 tablet by mouth daily.     DYMISTA 137-50 MCG/ACT SUSP Place 1 spray into the nose as needed.  6   ezetimibe (ZETIA) 10 MG tablet Take 1 tablet (10 mg total) by mouth daily. 90 tablet 3   fexofenadine (ALLEGRA) 180 MG tablet Take 180 mg by mouth daily.     fluticasone (CUTIVATE) 0.005 % ointment      fluticasone (FLONASE) 50 MCG/ACT nasal spray Place 1-2 sprays into both nostrils daily.     Iron-Folic Acid-Vit E31 (IRON FORMULA PO) Take by mouth.     levocetirizine (XYZAL) 5 MG tablet Take 5 mg by mouth every evening.     montelukast (SINGULAIR) 10 MG tablet Take 10 mg by mouth at bedtime.     Multiple Vitamin (MULITIVITAMIN WITH MINERALS) TABS Take 1 tablet by mouth 2 (two) times daily.     Omega-3 Fatty Acids (FISH OIL PO) Take 2 capsules by mouth 2 (two) times daily.      rosuvastatin (CRESTOR) 20 MG tablet Take 1 tablet (20 mg total) by mouth daily. 90 tablet 3   Adalimumab (HUMIRA PEN) 40 MG/0.8ML PNKT Inject 40 mg into the skin every 14 (fourteen) days. 2 each 6   No current facility-administered medications for this visit.    Allergies as of 05/23/2021   (No Known Allergies)    Family History  Problem Relation Age of  Onset   Arthritis Mother    Dementia Mother    Heart disease Father 9   Hyperlipidemia Father    Hypertension Father    Heart attack Father    Heart disease Brother 31       MI   Parkinson's disease Brother    Stroke Maternal Grandfather    Asthma Son    Colon cancer Neg Hx    Esophageal cancer Neg Hx    Pancreatic cancer Neg Hx    Stomach cancer Neg Hx    Liver disease Neg Hx     Social History   Socioeconomic History   Marital status: Married    Spouse name: Not on file   Number of children: 3   Years of education: Not on file   Highest education level: Not on file  Occupational History   Occupation: Retired Pharmacist, hospital  Tobacco Use  Smoking status: Never   Smokeless tobacco: Never  Vaping Use   Vaping Use: Never used  Substance and Sexual Activity   Alcohol use: Yes    Comment: weekends   Drug use: No   Sexual activity: Yes    Birth control/protection: Post-menopausal  Other Topics Concern   Not on file  Social History Narrative   Not on file   Social Determinants of Health   Financial Resource Strain: Not on file  Food Insecurity: Not on file  Transportation Needs: Not on file  Physical Activity: Not on file  Stress: Not on file  Social Connections: Not on file  Intimate Partner Violence: Not on file     Physical Exam: BP 140/80    Pulse 68    Ht 5' 7"  (1.702 m)    Wt 225 lb (102.1 kg)    BMI 35.24 kg/m  Constitutional: generally well-appearing Psychiatric: alert and oriented x3 Abdomen: soft, mild lower abdominal discomfort midline, nondistended, no obvious ascites, no peritoneal signs, normal bowel sounds No peripheral edema noted in lower extremities  Assessment and plan: 66 y.o. female with known Crohn's ileocolitis, likely flare  She had laboratory work-up sent already this morning including CBC, complete metabolic profile, inflammatory markers and blood in stool and GI pathogen panel.  I think it is unlikely that this is an infectious  illness and so I am going to start her on 40 mg of prednisone once daily, she does not taper until she hears back from me.  She will get trough Humira level and same time anti-Humira antibodies checked.  Those will be done in about 10 days from now.  We will schedule follow-up appointment with me in about 2 months from now.  She knows we will be in contact with her lab results as above.  Please see the "Patient Instructions" section for addition details about the plan.  Owens Loffler, MD [facility] 05/23/2021, 2:49 PM   Total time on date of encounter was 35 minutes (this included time spent preparing to see the patient reviewing records; obtaining and/or reviewing separately obtained history; performing a medically appropriate exam and/or evaluation; counseling and educating the patient and family if present; ordering medications, tests or procedures if applicable; and documenting clinical information in the health record).

## 2021-05-23 NOTE — Patient Instructions (Addendum)
If you are age 67 or older, your body mass index should be between 23-30. Your Body mass index is 35.24 kg/m. If this is out of the aforementioned range listed, please consider follow up with your Primary Care Provider. ________________________________________________________  The [facility] providers would like to encourage you to use [facility] to communicate with providers for non-urgent requests or questions.  Due to long hold times on the telephone, sending your provider a message by [facility] may be a faster and more efficient way to get a response.  Please allow 48 business hours for a response.  Please remember that this is for non-urgent requests.  _______________________________________________________  We have sent the following medications to your pharmacy for you to pick up at your convenience: START: prednisone 79m one tablet daily (until you are told to taper by our office).  Your provider has requested that you go to the basement level for lab work on 06-01-21. Press "B" on the elevator. The lab is located at the first door on the left as you exit the elevator.  Due to recent changes in healthcare laws, you may see the results of your imaging and laboratory studies on MyChart before your provider has had a chance to review them.  We understand that in some cases there may be results that are confusing or concerning to you. Not all laboratory results come back in the same time frame and the provider may be waiting for multiple results in order to interpret others.  Please give [id number] hours in order for your provider to thoroughly review all the results before contacting the office for clarification of your results.   You will need a follow up appointment in 2 months (April 2023).  We will contact you to schedule this appointment.  Thank you for entrusting me with your care and [facility]  Dr JArdis Hughs

## 2021-05-24 LAB — COMPREHENSIVE METABOLIC PANEL
ALT: 13 U/L (ref 0–35)
AST: 19 U/L (ref 0–37)
Albumin: 3.9 g/dL (ref 3.5–5.2)
Alkaline Phosphatase: 56 U/L (ref 39–117)
BUN: 14 mg/dL (ref 6–23)
CO2: 29 mEq/L (ref 19–32)
Calcium: 9.6 mg/dL (ref 8.4–10.5)
Chloride: 104 mEq/L (ref 96–112)
Creatinine, Ser: 0.87 mg/dL (ref 0.40–1.20)
GFR: 69.33 mL/min (ref >60.00)
Glucose, Bld: 80 mg/dL (ref 70–99)
Potassium: 4.3 mEq/L (ref 3.5–5.1)
Sodium: 139 mEq/L (ref 135–145)
Total Bilirubin: 0.3 mg/dL (ref 0.2–1.2)
Total Protein: 7.4 g/dL (ref 6.0–8.3)

## 2021-05-24 LAB — HIGH SENSITIVITY CRP: CRP, High Sensitivity: 1.85 mg/L (ref 0.000–5.000)

## 2021-05-27 LAB — GI PROFILE, STOOL, PCR

## 2021-05-27 LAB — CALPROTECTIN, FECAL: Calprotectin, Fecal: 445 ug/g — ABNORMAL HIGH (ref 0–120)

## 2021-06-01 ENCOUNTER — Other Ambulatory Visit: Payer: BC Managed Care – PPO

## 2021-06-01 DIAGNOSIS — K5080 Crohn's disease of both small and large intestine without complications: Secondary | ICD-10-CM | POA: Diagnosis not present

## 2021-06-05 ENCOUNTER — Encounter: Payer: Self-pay | Admitting: Gastroenterology

## 2021-06-08 ENCOUNTER — Other Ambulatory Visit: Payer: Self-pay

## 2021-06-08 LAB — ADALIMUMAB+AB (SERIAL MONITOR)
Adalimumab Drug Level: 4.2 ug/mL
Anti-Adalimumab Antibody: <25 ng/mL

## 2021-06-08 MED ORDER — HUMIRA (2 PEN) 80 MG/0.8ML ~~LOC~~ PNKT: 80.0000 mg | PEN_INJECTOR | SUBCUTANEOUS | 11 refills | Status: DC

## 2021-06-12 ENCOUNTER — Telehealth: Payer: Self-pay

## 2021-06-12 NOTE — Telephone Encounter (Signed)
Call placed to [facility] to check status of Humira. Left message on machine to call back

## 2021-06-12 NOTE — Telephone Encounter (Signed)
-----   Message from Timothy Lasso, RN sent at 06/08/2021 12:22 PM EST ----- Make sure insurance responds regarding Humira

## 2021-06-13 NOTE — Telephone Encounter (Signed)
Dr Ardis Hughs the pt states she had her injection of Humira 40 mg 2 weeks ago then 40 mg last week and is due again this week.  She wants to know if she should do another 40 mg or use 2 pens and do 80 mg this week.  I have tried to contact [facility] about the increase to 80 mg to make sure that there will not be any issue but I can't get a call back.  The pt is going to call today as well.   ?

## 2021-06-13 NOTE — Telephone Encounter (Signed)
Left message on machine to call back  

## 2021-06-13 NOTE — Telephone Encounter (Signed)
The pt returned call and she has spoken with [facility].  The 80 mg was approved and will be shipped to her this Friday.  She will take 80 mg this weekend. ?

## 2021-06-15 ENCOUNTER — Other Ambulatory Visit: Payer: BC Managed Care – PPO | Admitting: *Deleted

## 2021-06-15 ENCOUNTER — Other Ambulatory Visit: Payer: Self-pay

## 2021-06-15 DIAGNOSIS — E7800 Pure hypercholesterolemia, unspecified: Secondary | ICD-10-CM | POA: Diagnosis not present

## 2021-06-15 LAB — BASIC METABOLIC PANEL
BUN/Creatinine Ratio: 17 (ref 12–28)
BUN: 16 mg/dL (ref 8–27)
CO2: 27 mmol/L (ref 20–29)
Calcium: 9.5 mg/dL (ref 8.7–10.3)
Chloride: 104 mmol/L (ref 96–106)
Creatinine, Ser: 0.92 mg/dL (ref 0.57–1.00)
Glucose: 77 mg/dL (ref 70–99)
Potassium: 3.8 mmol/L (ref 3.5–5.2)
Sodium: 143 mmol/L (ref 134–144)
eGFR: 69 mL/min/{1.73_m2} (ref >59)

## 2021-06-15 LAB — LIPID PANEL
Chol/HDL Ratio: 1.7 ratio (ref 0.0–4.4)
Cholesterol, Total: 161 mg/dL (ref 100–199)
HDL: 95 mg/dL (ref >39)
LDL Chol Calc (NIH): 53 mg/dL (ref 0–99)
Triglycerides: 70 mg/dL (ref 0–149)
VLDL Cholesterol Cal: 13 mg/dL (ref 5–40)

## 2021-06-15 LAB — ALT: ALT: 21 IU/L (ref 0–32)

## 2021-06-19 DIAGNOSIS — L819 Disorder of pigmentation, unspecified: Secondary | ICD-10-CM | POA: Diagnosis not present

## 2021-06-19 DIAGNOSIS — D1801 Hemangioma of skin and subcutaneous tissue: Secondary | ICD-10-CM | POA: Diagnosis not present

## 2021-06-29 ENCOUNTER — Ambulatory Visit (INDEPENDENT_AMBULATORY_CARE_PROVIDER_SITE_OTHER): Payer: BC Managed Care – PPO | Admitting: Physician Assistant

## 2021-06-29 ENCOUNTER — Other Ambulatory Visit: Payer: Self-pay

## 2021-06-29 ENCOUNTER — Encounter: Payer: Self-pay | Admitting: Physician Assistant

## 2021-06-29 VITALS — BP 140/90 | HR 78 | Ht 67.0 in | Wt 225.2 lb

## 2021-06-29 DIAGNOSIS — I10 Essential (primary) hypertension: Secondary | ICD-10-CM | POA: Diagnosis not present

## 2021-06-29 DIAGNOSIS — Z111 Encounter for screening for respiratory tuberculosis: Secondary | ICD-10-CM | POA: Diagnosis not present

## 2021-06-29 DIAGNOSIS — Z021 Encounter for pre-employment examination: Secondary | ICD-10-CM | POA: Diagnosis not present

## 2021-06-29 DIAGNOSIS — Z6835 Body mass index (BMI) 35.0-35.9, adult: Secondary | ICD-10-CM

## 2021-06-29 DIAGNOSIS — K5080 Crohn's disease of both small and large intestine without complications: Secondary | ICD-10-CM | POA: Diagnosis not present

## 2021-06-29 DIAGNOSIS — E782 Mixed hyperlipidemia: Secondary | ICD-10-CM | POA: Insufficient documentation

## 2021-06-29 NOTE — Assessment & Plan Note (Signed)
Stable, not currently taking anti-hypertensive medicine, eat a low salt diet, do not add any salt to food when cooking, avoid processed foods, avoid fried foods ? ?

## 2021-06-29 NOTE — Progress Notes (Addendum)
? ?Acute Office Visit ? ?Subjective:  ? ? Patient ID: Connie Porter, female    DOB: Jun 19, 1955, 66 y.o.   MRN: [id number] ? ?Chief Complaint  ?Patient presents with  ? Follow-up  ?  Pt needs form completed for her job. No other concerns  ? ? 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 States she prefers not to get a TB skin test since she is on Humira for Crohn's Disease. ? ?Outpatient Medications Prior to Visit  ?Medication Sig Dispense Refill  ? Adalimumab (HUMIRA PEN) 80 MG/0.8ML PNKT Inject 80 mg into the skin every 14 (fourteen) days. 2 each 11  ? azelastine (ASTELIN) 0.1 % nasal spray     ? CALCIUM CITRATE PO Take 1 tablet by mouth 2 (two) times daily.    ? Cholecalciferol (VITAMIN D-3 PO) Take 1 capsule by mouth at bedtime.    ? Cyanocobalamin (VITAMIN B-12 PO) Take 1 tablet by mouth daily.    ? DYMISTA 137-50 MCG/ACT SUSP Place 1 spray into the nose as needed.  6  ? fexofenadine (ALLEGRA) 180 MG tablet Take 180 mg by mouth daily.    ? fluticasone (CUTIVATE) 0.005 % ointment     ? fluticasone (FLONASE) 50 MCG/ACT nasal spray Place 1-2 sprays into both nostrils daily.    ? Iron-Folic Acid-Vit K09 (IRON FORMULA PO) Take by mouth.    ? levocetirizine (XYZAL) 5 MG tablet Take 5 mg by mouth every evening.    ? montelukast (SINGULAIR) 10 MG tablet Take 10 mg by mouth at bedtime.    ? Multiple Vitamin (MULITIVITAMIN WITH MINERALS) TABS Take 1 tablet by mouth 2 (two) times daily.    ? Omega-3 Fatty Acids (FISH OIL PO) Take 2 capsules by mouth 2 (two) times daily.     ? predniSONE (DELTASONE) 20 MG tablet Take 2 tablets (40 mg total) by mouth daily. 60 tablet 2  ? rosuvastatin (CRESTOR) 20 MG tablet Take 1 tablet (20 mg total) by mouth daily. 90 tablet 3  ? ezetimibe (ZETIA) 10 MG tablet Take 1 tablet (10 mg total) by mouth daily. 90 tablet 3  ? ?No facility-administered medications prior to visit.  ? ? ?No Known  Allergies ? ?Review of Systems  ?Constitutional:  Negative for activity change and chills.  ?HENT:  Negative for congestion and voice change.   ?Eyes:  Negative for pain and redness.  ?Respiratory:  Negative for cough and wheezing.   ?Cardiovascular:  Negative for chest pain.  ?Gastrointestinal:  Negative for constipation, diarrhea, nausea and vomiting.  ?Endocrine: Negative for polyuria.  ?Genitourinary:  Negative for frequency.  ?Skin:  Negative for color change and rash.  ?Allergic/Immunologic: Negative for immunocompromised state.  ?Neurological:  Negative for dizziness.  ?Psychiatric/Behavioral:  Negative for agitation.   ? ?   ?Objective:  ?  ?Physical Exam ?Vitals and nursing note reviewed.  ?Constitutional:   ?   General: She is not in acute distress. ?   Appearance: Normal appearance. She is not ill-appearing.  ?HENT:  ?   Head: Normocephalic and atraumatic.  ?   Right Ear: External ear normal.  ?   Left Ear: External ear normal.  ?   Nose: No congestion.  ?Eyes:  ?   Extraocular Movements: Extraocular movements intact.  ?   Conjunctiva/sclera: Conjunctivae normal.  ?   Pupils: Pupils are equal, round, and reactive to  light.  ?Cardiovascular:  ?   Rate and Rhythm: Normal rate and regular rhythm.  ?   Pulses: Normal pulses.  ?   Heart sounds: Normal heart sounds.  ?Pulmonary:  ?   Effort: Pulmonary effort is normal.  ?   Breath sounds: Normal breath sounds. No wheezing.  ?Abdominal:  ?   General: Bowel sounds are normal.  ?   Palpations: Abdomen is soft.  ?Musculoskeletal:  ?   Cervical back: Normal range of motion and neck supple.  ?   Right lower leg: No edema.  ?   Left lower leg: No edema.  ?Skin: ?   General: Skin is warm and dry.  ?   Findings: No bruising.  ?Neurological:  ?   General: No focal deficit present.  ?   Mental Status: She is alert and oriented to person, place, and time.  ?Psychiatric:     ?   Mood and Affect: Mood normal.     ?   Behavior: Behavior normal.     ?   Thought Content:  Thought content normal.  ? ? ?BP 140/90   Pulse 78   Ht 5' 7"  (1.702 m)   Wt 225 lb 3.2 oz (102.2 kg)   SpO2 96%   BMI 35.27 kg/m?  ? ?BP Readings from Last 5 Encounters:  ?06/29/21 140/90  ?05/23/21 140/80  ?03/16/21 112/78  ?12/26/20 (!) 138/94  ?12/22/20 135/81  ? ? ? ?Wt Readings from Last 3 Encounters:  ?06/29/21 225 lb 3.2 oz (102.2 kg)  ?05/23/21 225 lb (102.1 kg)  ?03/16/21 224 lb 12.8 oz (102 kg)  ? ? ?Results for orders placed or performed in visit on 06/15/21  ?Lipid Profile  ?Result Value Ref Range  ? Cholesterol, Total 161 100 - 199 mg/dL  ? Triglycerides 70 0 - 149 mg/dL  ? HDL 95 >39 mg/dL  ? VLDL Cholesterol Cal 13 5 - 40 mg/dL  ? LDL Chol Calc (NIH) 53 0 - 99 mg/dL  ? Chol/HDL Ratio 1.7 0.0 - 4.4 ratio  ?ALT  ?Result Value Ref Range  ? ALT 21 0 - 32 IU/L  ?Basic Metabolic Panel (BMET)  ?Result Value Ref Range  ? Glucose 77 70 - 99 mg/dL  ? BUN 16 8 - 27 mg/dL  ? Creatinine, Ser 0.92 0.57 - 1.00 mg/dL  ? eGFR 69 >59 mL/min/1.73  ? BUN/Creatinine Ratio 17 12 - 28  ? Sodium 143 134 - 144 mmol/L  ? Potassium 3.8 3.5 - 5.2 mmol/L  ? Chloride 104 96 - 106 mmol/L  ? CO2 27 20 - 29 mmol/L  ? Calcium 9.5 8.7 - 10.3 mg/dL  ? ? ?   ?Assessment & Plan:  ?1. Physical exam, pre-employment ?- QuantiFERON-TB Gold Plus - patient advised this test will take a week for results to return; will fax completed form once results are in. ? ?2. Primary hypertension ?- eat a low salt diet, do not add any salt to food when cooking, avoid processed foods, avoid fried foods ? ? ?3. Crohn's disease of both small and large intestine without complication ([place]) ?- Stable, continue current management ? ? ?4. Screening-pulmonary TB ?- QuantiFERON-TB Gold Plus ? ?5. Body mass index (BMI) 35.0-35.9, adult ? ? ? ?No orders of the defined types were placed in this encounter. ? ? ?Return for Return as Already Scheduled. ? ?Irene Pap, PA-C ?

## 2021-06-29 NOTE — Assessment & Plan Note (Signed)
Stable, will order quantiferon tb gold lab to screen for TB ?

## 2021-07-02 NOTE — Progress Notes (Signed)
Just an FYI - I explained to this patient that we will have to wait for the Quantiferon Gold plus lab result to come back and that it can take up to a week. She verbalized understanding.  ?Also, she can't receive the TB skin test injection.

## 2021-07-03 LAB — QUANTIFERON-TB GOLD PLUS
QuantiFERON Mitogen Value: 9.22 IU/mL
QuantiFERON Nil Value: 0.03 IU/mL
QuantiFERON TB1 Ag Value: 0.02 IU/mL
QuantiFERON TB2 Ag Value: 0.03 IU/mL
QuantiFERON-TB Gold Plus: NEGATIVE

## 2021-07-04 ENCOUNTER — Encounter: Payer: Self-pay | Admitting: Physician Assistant

## 2021-07-17 ENCOUNTER — Encounter: Payer: Self-pay | Admitting: Gastroenterology

## 2021-07-17 ENCOUNTER — Ambulatory Visit (INDEPENDENT_AMBULATORY_CARE_PROVIDER_SITE_OTHER): Payer: BC Managed Care – PPO | Admitting: Gastroenterology

## 2021-07-17 VITALS — BP 132/90 | HR 80 | Ht 67.0 in | Wt 226.0 lb

## 2021-07-17 DIAGNOSIS — K5080 Crohn's disease of both small and large intestine without complications: Secondary | ICD-10-CM

## 2021-07-17 MED ORDER — PREDNISONE 10 MG PO TABS: 10.0000 mg | ORAL_TABLET | ORAL | 0 refills | Status: DC

## 2021-07-17 NOTE — Progress Notes (Signed)
Review of pertinent gastrointestinal problems: ?1.Crohn's ileocolitis: Workup at [facility].Marland KitchenMarland KitchenPromethius IBD Serology 7 testing 02/2008 "pattern consistent with IBD.Marland KitchenMarland KitchenCrohn's." Colonoscopy 01/2008 showed terminal ileum and right colon ulcerations, pathology c/w acute and chronic ileitis, colitis, also HP polyps. Colonoscopy Path report from 2006 showed HP polyps. Office note 05/2009 documented previous C. diff colitis (treated empirically and also after toxin +). 2012 doing well on oral mesalamine 3.6 gms/day. ? Worsening of disease 03/2013, c. Diff PCR neg; increased mesalamine to 4.8gm daily. Colonoscopy 04/2012 found essentially normal colon but moderate to severe ileitis (path confirmed acute and chronic inflammation), started on 297m azathiaprine daily Had erythema nodosum 2014, early, improved with steroids. 11/2012 doing very well on 200 azathiaprine daily. 04/2013: urgency, straining: felt to be more functional then IBD related; fiber supplements stopped.  12/2013 doing well on 200 azathiaprine daily. 03/2015 MRI enterograophy "mild wall thickening in TI and cecum...active crohn's disease", elevated inflammatory markers and so azathiaprine dose increased to 2545mdaily; Decreased back to 20066maily due to blood counts, LFTs. ?Started humira 11/2016 22m7mW ?Labs 10/2016: TB quant gold negative, hep B S Ag negative, Hep B S Ab positive ?TB quant gold 11/2019 negative ?Flare early 2023 with elevated inflammatory markers, elevated fecal calprotectin, nonbloody diarrhea, abdominal discomforts, GI pathogen panel was negative.  05/2021 Humira antibody negative, Humira drug level 4.2 (lower than ideal) ?05/2021 Humira dosing increased to 80 mg every other week ?2. Dysphasia: EGD August, 2011 found Schatzki's ring that was dilated to 20 mm, small hiatal hernia, tortuous esophagus. Swallowing much improved. March 2012 PPI only every other day. Repeat EGD 03/2013 for dysphagia showed 4cm HH, tortuous distal esophagus,  Schatzki's ring that was dilated to 20mm46m. Thrombosed external hemorrhoid 2016, this was around the time of constipation related to pain medicine after a knee surgery. Resolve with conservative therapy ?4. C. Diff + PCR 12/2016; diarrhea; treated with 2 week oral vanc course; diarrhea significantly improved after the antibiotics. ? ?HPI: ?This is a very pleasant 66 ye8 old woman ? ?I last saw her here in the office about 67 months ago.  She is clearly having a flare, confirmed by inflammatory markers, fecal calprotectin, normal GI pathogen panel.  Her Humira drug level was less than ideal and she did not have circulating antibodies and so I increased her Humira dose. ? ?She has been on prednisone 40 mg once daily for the past 2 months.  She has taken 3 Humira doses at the higher strength, 80 mg every other week. ? ?Her bowels have been completely back to normal for the past 6 weeks or so.  She has 2 solid brown bowel movements daily.  No diarrhea, no abdominal pains. ? ? ?ROS: complete GI ROS as described in HPI, all other review negative. ? ?Constitutional:  No unintentional weight loss ? ? ?Past Medical History:  ?Diagnosis Date  ? Crohn's disease (HCC) [facility] crowns present   ? High cholesterol   ? History of hiatal hernia   ? HTN (hypertension) 04/27/2018  ? Hyperlipidemia 01/13/2014  ? Osteoarthritis of knee, unilateral 09/2014  ? right   ? Patellofemoral arthritis of right knee 10/07/2014  ? Seasonal allergies   ? ? ?Past Surgical History:  ?Procedure Laterality Date  ? APPENDECTOMY    ? CESAREAN SECTION    ? x 3  ? CHOLECYSTECTOMY    ? COLONOSCOPY    ? ESOPHAGOGASTRODUODENOSCOPY (EGD) WITH ESOPHAGEAL DILATION    ? x 2  ? PATELLA-FEMORAL ARTHROPLASTY Right 10/07/2014  ?  Procedure: RIGHT PATELLA-FEMORAL ARTHROPLASTY;  Surgeon: Marchia Bond, MD;  Location: [facility];  Service: Orthopedics;  Laterality: Right;  ? TONSILLECTOMY AND ADENOIDECTOMY    ? WISDOM TOOTH EXTRACTION    ? ? ?Current  Outpatient Medications  ?Medication Instructions  ? azelastine (ASTELIN) 0.1 % nasal spray No dose, route, or frequency recorded.  ? CALCIUM CITRATE PO 1 tablet, 2 times daily  ? Cholecalciferol (VITAMIN D-3 PO) 1 capsule, Daily at bedtime  ? Cyanocobalamin (VITAMIN B-12 PO) 1 tablet, Daily  ? DYMISTA 137-50 MCG/ACT SUSP 1 spray, Nasal, As needed  ? ezetimibe (ZETIA) 10 mg, Oral, Daily  ? fexofenadine (ALLEGRA) 180 mg, Daily  ? fluticasone (CUTIVATE) 0.005 % ointment No dose, route, or frequency recorded.  ? fluticasone (FLONASE) 50 MCG/ACT nasal spray 1-2 sprays, Each Nare, Daily  ? Humira Pen 80 mg, Subcutaneous, Every 14 days  ? Iron-Folic Acid-Vit K81 (IRON FORMULA PO) Oral  ? levocetirizine (XYZAL) 5 mg, Every evening  ? montelukast (SINGULAIR) 10 mg, Daily at bedtime  ? Multiple Vitamin (MULITIVITAMIN WITH MINERALS) TABS 1 tablet, 2 times daily  ? Omega-3 Fatty Acids (FISH OIL PO) 2 capsules, 2 times daily  ? predniSONE (DELTASONE) 40 mg, Oral, Daily  ? rosuvastatin (CRESTOR) 20 mg, Oral, Daily  ? ? ?Allergies as of 07/17/2021  ? (No Known Allergies)  ? ? ?Family History  ?Problem Relation Age of Onset  ? Arthritis Mother   ? Dementia Mother   ? Heart disease Father 28  ? Hyperlipidemia Father   ? Hypertension Father   ? Heart attack Father   ? Heart disease Brother 13  ?     MI  ? Parkinson's disease Brother   ? Stroke Maternal Grandfather   ? Asthma Son   ? Colon cancer Neg Hx   ? Esophageal cancer Neg Hx   ? Pancreatic cancer Neg Hx   ? Stomach cancer Neg Hx   ? Liver disease Neg Hx   ? ? ?Social History  ? ?Socioeconomic History  ? Marital status: Married  ?  Spouse name: Not on file  ? Number of children: 3  ? Years of education: Not on file  ? Highest education level: Not on file  ?Occupational History  ? Occupation: Retired Pharmacist, hospital  ?Tobacco Use  ? Smoking status: Never  ? Smokeless tobacco: Never  ?Vaping Use  ? Vaping Use: Never used  ?Substance and Sexual Activity  ? Alcohol use: Yes  ?  Comment:  weekends  ? Drug use: No  ? Sexual activity: Yes  ?  Birth control/protection: Post-menopausal  ?Other Topics Concern  ? Not on file  ?Social History Narrative  ? Not on file  ? ?Social Determinants of Health  ? ?Financial Resource Strain: Not on file  ?Food Insecurity: Not on file  ?Transportation Needs: Not on file  ?Physical Activity: Not on file  ?Stress: Not on file  ?Social Connections: Not on file  ?Intimate Partner Violence: Not on file  ? ? ? ?Physical Exam: ?BP 132/90   Pulse 80   Ht 5' 7"  (1.702 m)   Wt 226 lb (102.5 kg)   BMI 35.40 kg/m?  ?Constitutional: generally well-appearing ?Psychiatric: alert and oriented x3 ?Abdomen: soft, nontender, nondistended, no obvious ascites, no peritoneal signs, normal bowel sounds ?No peripheral edema noted in lower extremities ? ?Assessment and plan: ?66 y.o. female with Crohn's ileocolitis ? ?Her flare symptoms have resolved with prednisone 40 mg and also increased dose of her Humira  up to 80 mg every other week.  She is going to start tapering from her prednisone by 10 mg every week and she will return to see me here in the office in about 6 weeks. ? ?Please see the "Patient Instructions" section for addition details about the plan. ? ?Owens Loffler, MD ?[facility] ?07/17/2021, 9:23 AM ? ? ?Total time on date of encounter was 25 minutes (this included time spent preparing to see the patient reviewing records; obtaining and/or reviewing separately obtained history; performing a medically appropriate exam and/or evaluation; counseling and educating the patient and family if present; ordering medications, tests or procedures if applicable; and documenting clinical information in the health record). ? ?

## 2021-07-17 NOTE — Patient Instructions (Addendum)
If you are age 67 or older, your body mass index should be between 23-30. Your Body mass index is 35.4 kg/m?Marland Kitchen If this is out of the aforementioned range listed, please consider follow up with your Primary Care Provider. ?________________________________________________________ ? ?The [facility] providers would like to encourage you to use [facility] to communicate with providers for non-urgent requests or questions.  Due to long hold times on the telephone, sending your provider a message by [facility] may be a faster and more efficient way to get a response.  Please allow 48 business hours for a response.  Please remember that this is for non-urgent requests.  ?_______________________________________________________ ? ?We have sent the following medications to your pharmacy for you to pick up at your convenience: ? ?Prednisone 56m as directed.  Take 353mfor 7 days, then 209mor 7 days, then 34m62mr days, then discontinue.  ? ?You are scheduled to follow up on 09-07-21 at 9:10am. ? ?Thank you for entrusting me with your care and choosing [facility]?Dr JacoArdis Hughs

## 2021-07-23 ENCOUNTER — Telehealth: Payer: Self-pay

## 2021-07-23 NOTE — Telephone Encounter (Signed)
Received a fax request to complete Humira PA on Cover my meds.  The PA has been completed and approved.   ?

## 2021-08-29 ENCOUNTER — Telehealth: Payer: Self-pay | Admitting: Gastroenterology

## 2021-08-29 NOTE — Telephone Encounter (Signed)
Patient called requesting to reschedule her appt with Dr. Ardis Hughs said he wants to see her prior to his next available please advise. ?

## 2021-08-30 NOTE — Telephone Encounter (Signed)
The pt has been rescheduled to 7/12 per request.  She has to be at work and can not miss.

## 2021-09-07 ENCOUNTER — Ambulatory Visit: Payer: BC Managed Care – PPO | Admitting: Gastroenterology

## 2021-10-01 NOTE — Progress Notes (Unsigned)
Complete physical exam   Patient: Connie Porter   DOB: Oct 25, 1955   66 y.o. Female  MRN: [id number] Visit Date: 10/02/2021  No chief complaint on file.  Subjective    Connie Porter is a 66 y.o. female who presents today for a complete physical exam.   Reports is generally feeling well, fairly well, poorly*; is eating a *** diet; is sleeping well ***; drinks *** bottles of water a day; is exercising ***   HPI  ***  Past Medical History:  Diagnosis Date   Crohn's disease ([place])    Dental crowns present    High cholesterol    History of hiatal hernia    HTN (hypertension) 04/27/2018   Hyperlipidemia 01/13/2014   Osteoarthritis of knee, unilateral 09/2014   right    Patellofemoral arthritis of right knee 10/07/2014   Seasonal allergies    Past Surgical History:  Procedure Laterality Date   APPENDECTOMY     CESAREAN SECTION     x 3   CHOLECYSTECTOMY     COLONOSCOPY     ESOPHAGOGASTRODUODENOSCOPY (EGD) WITH ESOPHAGEAL DILATION     x 2   PATELLA-FEMORAL ARTHROPLASTY Right 10/07/2014   Procedure: RIGHT PATELLA-FEMORAL ARTHROPLASTY;  Surgeon: Marchia Bond, MD;  Location: [place];  Service: Orthopedics;  Laterality: Right;   TONSILLECTOMY AND ADENOIDECTOMY     WISDOM TOOTH EXTRACTION     Social History   Socioeconomic History   Marital status: Married    Spouse name: Not on file   Number of children: 3   Years of education: Not on file   Highest education level: Not on file  Occupational History   Occupation: Retired Pharmacist, hospital  Tobacco Use   Smoking status: Never   Smokeless tobacco: Never  Vaping Use   Vaping Use: Never used  Substance and Sexual Activity   Alcohol use: Yes    Comment: weekends   Drug use: No   Sexual activity: Yes    Birth control/protection: Post-menopausal  Other Topics Concern   Not on file  Social History Narrative   Not on file   Social Determinants of Health   Financial Resource Strain: Not on file  Food Insecurity:  Not on file  Transportation Needs: Not on file  Physical Activity: Not on file  Stress: Not on file  Social Connections: Not on file  Intimate Partner Violence: Not on file   Family Status  Relation Name Status   Mother  Deceased   Father  Deceased at age 59       MI   Brother [place] Alive   Brother  Alive   [employer]  (Not Specified)   Son  Alive   Son  Alive   Son  Alive   Neg Hx  (Not Specified)   Family History  Problem Relation Age of Onset   Arthritis Mother    Dementia Mother    Heart disease Father 48   Hyperlipidemia Father    Hypertension Father    Heart attack Father    Heart disease Brother 78       MI   Parkinson's disease Brother    Stroke Maternal Grandfather    Asthma Son    Colon cancer Neg Hx    Esophageal cancer Neg Hx    Pancreatic cancer Neg Hx    Stomach cancer Neg Hx    Liver disease Neg Hx    No Known Allergies  Patient Care Team: Francis Gaines  B, PA-C as PCP - General (Physician Assistant) Nahser, Wonda Cheng, MD as PCP - Cardiology (Cardiology)   Medications: Outpatient Medications Prior to Visit  Medication Sig   Adalimumab (HUMIRA PEN) 80 MG/0.8ML PNKT Inject 80 mg into the skin every 14 (fourteen) days.   azelastine (ASTELIN) 0.1 % nasal spray    CALCIUM CITRATE PO Take 1 tablet by mouth 2 (two) times daily.   Cholecalciferol (VITAMIN D-3 PO) Take 1 capsule by mouth at bedtime.   Cyanocobalamin (VITAMIN B-12 PO) Take 1 tablet by mouth daily.   DYMISTA 137-50 MCG/ACT SUSP Place 1 spray into the nose as needed.   ezetimibe (ZETIA) 10 MG tablet Take 1 tablet (10 mg total) by mouth daily.   fexofenadine (ALLEGRA) 180 MG tablet Take 180 mg by mouth daily.   fluticasone (CUTIVATE) 0.005 % ointment    fluticasone (FLONASE) 50 MCG/ACT nasal spray Place 1-2 sprays into both nostrils daily.   Iron-Folic Acid-Vit L89 (IRON FORMULA PO) Take by mouth.   levocetirizine (XYZAL) 5 MG tablet Take 5 mg by mouth every evening.    montelukast (SINGULAIR) 10 MG tablet Take 10 mg by mouth at bedtime.   Multiple Vitamin (MULITIVITAMIN WITH MINERALS) TABS Take 1 tablet by mouth 2 (two) times daily.   Omega-3 Fatty Acids (FISH OIL PO) Take 2 capsules by mouth 2 (two) times daily.    predniSONE (DELTASONE) 10 MG tablet Take 1 tablet (10 mg total) by mouth as directed.   predniSONE (DELTASONE) 20 MG tablet Take 2 tablets (40 mg total) by mouth daily.   rosuvastatin (CRESTOR) 20 MG tablet Take 1 tablet (20 mg total) by mouth daily.   No facility-administered medications prior to visit.    Review of Systems  {Labs (Optional):23779}  The 10-year ASCVD risk score (Arnett DK, et al., 2019) is: 4.8%   Objective    There were no vitals taken for this visit.  {Show previous vital signs (optional):23777}   Physical Exam  ***  Last depression screening scores    06/29/2021    9:20 AM 09/28/2020    9:29 AM 03/19/2019    9:32 AM  PHQ 2/9 Scores  PHQ - 2 Score 0 0 0   Last fall risk screening    06/29/2021    9:20 AM  [place] in the past year? 0  Number falls in past yr: 0  Injury with Fall? 0  Risk for fall due to : No Fall Risks  Follow up Falls evaluation completed     No results found for any visits on 10/02/21.  Assessment & Plan    Routine Health Maintenance and Physical Exam  Exercise Activities and Dietary recommendations  Goals   None     Immunization History  Administered Date(s) Administered   Fluad Quad(high Dose 65+) 01/11/2020   Influenza Inj Mdck Quad Pf 01/31/2017   Influenza,inj,Quad PF,6+ Mos 01/13/2014, 02/13/2015, 02/23/2016, 01/19/2018, 01/16/2019   Influenza-Unspecified 01/31/2017, 01/19/2018   PFIZER(Purple Top)SARS-COV-2 Vaccination 07/04/2019, 12/23/2019, 07/19/2020   Pfizer Covid-19 Vaccine Bivalent Booster 84yr & up 12/25/2020   Pneumococcal Conjugate-13 03/19/2019   Pneumococcal Polysaccharide-23 08/25/2012   Td 09/15/2003   Tdap 02/13/2015   Zoster  Recombinat (Shingrix) 04/20/2018, 06/15/2018   Zoster, Live 02/21/2015    Health Maintenance  Topic Date Due   Pneumonia Vaccine 67 Years old (3 - PPSV23 if available, else PCV20) 03/18/2020   INFLUENZA VACCINE  11/13/2021   COLONOSCOPY (Pts 45-478yrInsurance coverage will need to be confirmed)  05/12/2022   MAMMOGRAM  10/31/2022   TETANUS/TDAP  02/12/2025   DEXA SCAN  Completed   COVID-19 Vaccine  Completed   Hepatitis C Screening  Completed   Zoster Vaccines- Shingrix  Completed   HPV VACCINES  Aged Out    Discussed health benefits of physical activity, and encouraged her to engage in regular exercise appropriate for her age and condition.  Problem List Items Addressed This Visit   None    No follow-ups on file.     Irene Pap, PA-C

## 2021-10-02 ENCOUNTER — Encounter: Payer: Self-pay | Admitting: Physician Assistant

## 2021-10-02 ENCOUNTER — Ambulatory Visit (INDEPENDENT_AMBULATORY_CARE_PROVIDER_SITE_OTHER): Payer: BC Managed Care – PPO | Admitting: Physician Assistant

## 2021-10-02 ENCOUNTER — Ambulatory Visit: Payer: BC Managed Care – PPO | Admitting: Family Medicine

## 2021-10-02 VITALS — BP 130/70 | HR 74 | Ht 67.0 in | Wt 232.2 lb

## 2021-10-02 DIAGNOSIS — I10 Essential (primary) hypertension: Secondary | ICD-10-CM

## 2021-10-02 DIAGNOSIS — E782 Mixed hyperlipidemia: Secondary | ICD-10-CM

## 2021-10-02 DIAGNOSIS — Z0000 Encounter for general adult medical examination without abnormal findings: Secondary | ICD-10-CM

## 2021-10-02 NOTE — Assessment & Plan Note (Signed)
Stable, continue current management

## 2021-10-02 NOTE — Assessment & Plan Note (Signed)
controlled, continue statin, eat a low fat diet, increase fiber intake (Benefiber or Metamucil, Cherrios,  oatmeal, beans, nuts, fruits and vegetables), limit saturated fats (in fried foods, red meat), can add OTC fish oil supplement, eat fish with Omega-3 fatty acids like salmon and tuna, exercise for 30 minutes 3 - 5 times a week, drink 8 - 10 glasses of water a day

## 2021-10-02 NOTE — Patient Instructions (Signed)
.Preventative Care for Adults - Female      MAINTAIN REGULAR HEALTH EXAMS: A routine yearly physical is a good way to check in with your primary care provider about your health and preventive screening. It is also an opportunity to share updates about your health and any concerns you have, and receive a thorough all-over exam.  Most health insurance companies pay for at least some preventative services.  Check with your health plan for specific coverages.  WHAT PREVENTATIVE SERVICES DO WOMEN NEED? Adult women should have their weight and blood pressure checked regularly.  Women age 48 and older should have their cholesterol levels checked regularly. Women should be screened for cervical cancer with a Pap smear and pelvic exam beginning at either age 35, or 3 years after they become sexually activity.   Breast cancer screening generally begins at age 15 with a mammogram and breast exam by your primary care provider.   Beginning at age 72 and continuing to age 36, women should be screened for colorectal cancer.  Certain people may need continued testing until age 33. Updating vaccinations is part of preventative care.  Vaccinations help protect against diseases such as the flu. Osteoporosis is a disease in which the bones lose minerals and strength as we age. Women ages 42 and over should discuss this with their caregivers, as should women after menopause who have other risk factors. Lab tests are generally done as part of preventative care to screen for anemia and blood disorders, to screen for problems with the kidneys and liver, to screen for bladder problems, to check blood sugar, and to check your cholesterol level. Preventative services generally include counseling about diet, exercise, avoiding tobacco, drugs, excessive alcohol consumption, and sexually transmitted infections.    GENERAL RECOMMENDATIONS FOR GOOD HEALTH:  Healthy diet: Eat a variety of foods, including fruit, vegetables,  animal or vegetable protein, such as meat, fish, chicken, and eggs, or beans, lentils, tofu, and grains, such as rice. Drink plenty of water daily (60 - 80 ounces or 8 - 10 glasses a day) Decrease saturated fat in the diet, avoid lots of red meat, processed foods, sweets, fast foods, and fried foods. For high cholesterol - Increase fiber intake (Benefiber or Metamucil, Cherrios,  oatmeal, beans, nuts, fruits and vegetables), limit saturated fats (in fried foods, red meat), can add OTC fish oil supplement, eat fish with Omega-3 fatty acids like salmon and tuna, exercise for 30 minutes 3 - 5 times a week, drink 8 - 10 glasses of water a day  Exercise: Aerobic exercise helps maintain good heart health. At least 30-40 minutes of moderate-intensity exercise is recommended. For example, a brisk walk that increases your heart rate and breathing. This should be done on most days of the week.  Find a type of exercise or a variety of exercises that you enjoy so that it becomes a part of your daily life.  Examples are running, walking, swimming, water aerobics, and biking.  For motivation and support, explore group exercise such as aerobic class, spin class, Zumba, Yoga,or  martial arts, etc.   Set exercise goals for yourself, such as a certain weight goal, walk or run in a race such as a 5k walk/run.  Speak to your primary care provider about exercise goals.  Disease prevention: If you smoke or chew tobacco, find out from your caregiver how to quit. It can literally save your life, no matter how long you have been a tobacco user. If you do not  use tobacco, never begin.  Maintain a healthy diet and normal weight. Increased weight leads to problems with blood pressure and diabetes.  The Body Mass Index or BMI is a way of measuring how much of your body is fat. Having a BMI above 27 increases the risk of heart disease, diabetes, hypertension, stroke and other problems related to obesity. Your caregiver can help  determine your BMI and based on it develop an exercise and dietary program to help you achieve or maintain this important measurement at a healthful level. High blood pressure causes heart and blood vessel problems.  Persistent high blood pressure should be treated with medicine if weight loss and exercise do not work.  Fat and cholesterol leaves deposits in your arteries that can block them. This causes heart disease and vessel disease elsewhere in your body.  If your cholesterol is found to be high, or if you have heart disease or certain other medical conditions, then you may need to have your cholesterol monitored frequently and be treated with medication.  Ask if you should have a cardiac stress test if your history suggests this. A stress test is a test done on a treadmill that looks for heart disease. This test can find disease prior to there being a problem. Menopause can be associated with physical symptoms and risks. Hormone replacement therapy is available to decrease these. You should talk to your caregiver about whether starting or continuing to take hormones is right for you.  Osteoporosis is a disease in which the bones lose minerals and strength as we age. This can result in serious bone fractures. Risk of osteoporosis can be identified using a bone density scan. Women ages 61 and over should discuss this with their caregivers, as should women after menopause who have other risk factors. Ask your caregiver whether you should be taking a calcium supplement and Vitamin D, to reduce the rate of osteoporosis.  Avoid drinking alcohol in excess (more than two drinks per day).  Avoid use of street drugs. Do not share needles with anyone. Ask for professional help if you need assistance or instructions on stopping the use of alcohol, cigarettes, and/or drugs. Brush your teeth twice a day with fluoride toothpaste, and floss once a day. Good oral hygiene prevents tooth decay and gum disease. The  problems can be painful, unattractive, and can cause other health problems. Visit your dentist for a routine oral and dental check up and preventive care every 6-12 months.  Look at your skin regularly.  Use a mirror to look at your back. Notify your caregivers of changes in moles, especially if there are changes in shapes, colors, a size larger than a pencil eraser, an irregular border, or development of new moles.  Safety: Use seatbelts 100% of the time, whether driving or as a passenger.  Use safety devices such as hearing protection if you work in environments with loud noise or significant background noise.  Use safety glasses when doing any work that could send debris in to the eyes.  Use a helmet if you ride a bike or motorcycle.  Use appropriate safety gear for contact sports.  Talk to your caregiver about gun safety. Use sunscreen with a SPF (or skin protection factor) of 15 or greater.  Lighter skinned people are at a greater risk of skin cancer. Don't forget to also wear sunglasses in order to protect your eyes from too much damaging sunlight. Damaging sunlight can accelerate cataract formation.  Practice safe sex. Use  condoms. Condoms are used for birth control and to help reduce the spread of sexually transmitted infections (or STIs).  Some of the STIs are gonorrhea (the clap), chlamydia, syphilis, trichomonas, herpes, HPV (human papilloma virus) and HIV (human immunodeficiency virus) which causes AIDS. The herpes, HIV and HPV are viral illnesses that have no cure. These can result in disability, cancer and death.  Keep carbon monoxide and smoke detectors in your home functioning at all times. Change the batteries every 6 months or use a model that plugs into the wall.   Vaccinations: Stay up to date with your tetanus shots and other required immunizations. You should have a booster for tetanus every 10 years. Be sure to get your flu shot every year, since 5%-20% of the [place] population comes  down with the flu. The flu vaccine changes each year, so being vaccinated once is not enough. Get your shot in the fall, before the flu season peaks.   Other vaccines to consider: Human Papilloma Virus or HPV causes cancer of the cervix, and other infections that can be transmitted from person to person. There is a vaccine for HPV, and females should get immunized between the ages of 60 and 52. It requires a series of 3 shots.  Pneumococcal vaccine to protect against certain types of pneumonia.  This is normally recommended for adults age 65 or older.  However, adults younger than 67 years old with certain underlying conditions such as diabetes, heart or lung disease should also receive the vaccine. Shingles vaccine to protect against Varicella Zoster if you are older than age 38, or younger than 67 years old with certain underlying illness. Hepatitis A vaccine to protect against a form of infection of the liver by a virus acquired from food. Hepatitis B vaccine to protect against a form of infection of the liver by a virus acquired from blood or body fluids, particularly if you work in health care. If you plan to travel internationally, check with your local health department for specific vaccination recommendations.  Cancer Screening: Breast cancer screening is essential to preventive care for women. All women age 74 and older should perform a breast self-exam every month. At age 57 and older, women should have their caregiver complete a breast exam each year. Women at ages 45 and older should have a mammogram (x-ray film) of the breasts. Your caregiver can discuss how often you need mammograms.   Cervical cancer screening includes taking a Pap smear (sample of cells examined under a microscope) from the cervix (end of the uterus). It also includes testing for HPV (Human Papilloma Virus, which can cause cervical cancer). Screening and a pelvic exam should begin at age 37, or 3 years after a woman  becomes sexually active. Screening should occur every year, with a Pap smear but no HPV testing, up to age 64. After age 51, you should have a Pap smear every 3 years with HPV testing, if no HPV was found previously.  Most routine colon cancer screening begins at the age of 21. On a yearly basis, doctors may provide special easy to use take-home tests to check for hidden blood in the stool. Sigmoidoscopy or colonoscopy can detect the earliest forms of colon cancer and is life saving. These tests use a small camera at the end of a tube to directly examine the colon. Speak to your caregiver about this at age 84, when routine screening begins (and is repeated every 5 years unless early forms of pre-cancerous polyps  or small growths are found).

## 2021-10-03 ENCOUNTER — Ambulatory Visit: Payer: BC Managed Care – PPO | Admitting: Physician Assistant

## 2021-10-03 LAB — COMPREHENSIVE METABOLIC PANEL
ALT: 17 IU/L (ref 0–32)
AST: 27 IU/L (ref 0–40)
Albumin/Globulin Ratio: 1.5 (ref 1.2–2.2)
Albumin: 4.4 g/dL (ref 3.8–4.8)
Alkaline Phosphatase: 69 IU/L (ref 44–121)
BUN/Creatinine Ratio: 20 (ref 12–28)
BUN: 17 mg/dL (ref 8–27)
Bilirubin Total: 0.4 mg/dL (ref 0.0–1.2)
CO2: 24 mmol/L (ref 20–29)
Calcium: 10.2 mg/dL (ref 8.7–10.3)
Chloride: 105 mmol/L (ref 96–106)
Creatinine, Ser: 0.84 mg/dL (ref 0.57–1.00)
Globulin, Total: 3 g/dL (ref 1.5–4.5)
Glucose: 98 mg/dL (ref 70–99)
Potassium: 4.5 mmol/L (ref 3.5–5.2)
Sodium: 143 mmol/L (ref 134–144)
Total Protein: 7.4 g/dL (ref 6.0–8.5)
eGFR: 77 mL/min/{1.73_m2} (ref >59)

## 2021-10-03 LAB — CBC WITH DIFFERENTIAL/PLATELET
Basophils Absolute: 0.1 10*3/uL (ref 0.0–0.2)
Basos: 2 %
EOS (ABSOLUTE): 0.1 10*3/uL (ref 0.0–0.4)
Eos: 2 %
Hematocrit: 40.5 % (ref 34.0–46.6)
Hemoglobin: 13.6 g/dL (ref 11.1–15.9)
Immature Grans (Abs): 0 10*3/uL (ref 0.0–0.1)
Immature Granulocytes: 0 %
Lymphocytes Absolute: 1.5 10*3/uL (ref 0.7–3.1)
Lymphs: 36 %
MCH: 31.3 pg (ref 26.6–33.0)
MCHC: 33.6 g/dL (ref 31.5–35.7)
MCV: 93 fL (ref 79–97)
Monocytes Absolute: 0.4 10*3/uL (ref 0.1–0.9)
Monocytes: 10 %
Neutrophils Absolute: 2.1 10*3/uL (ref 1.4–7.0)
Neutrophils: 50 %
Platelets: 294 10*3/uL (ref 150–450)
RBC: 4.35 x10E6/uL (ref 3.77–5.28)
RDW: 11.5 % — ABNORMAL LOW (ref 11.7–15.4)
WBC: 4.1 10*3/uL (ref 3.4–10.8)

## 2021-10-03 LAB — LIPID PANEL
Chol/HDL Ratio: 2 ratio (ref 0.0–4.4)
Cholesterol, Total: 207 mg/dL — ABNORMAL HIGH (ref 100–199)
HDL: 106 mg/dL (ref >39)
LDL Chol Calc (NIH): 82 mg/dL (ref 0–99)
Triglycerides: 112 mg/dL (ref 0–149)
VLDL Cholesterol Cal: 19 mg/dL (ref 5–40)

## 2021-10-08 DIAGNOSIS — M25512 Pain in left shoulder: Secondary | ICD-10-CM | POA: Diagnosis not present

## 2021-10-08 DIAGNOSIS — T8484XA Pain due to internal orthopedic prosthetic devices, implants and grafts, initial encounter: Secondary | ICD-10-CM | POA: Diagnosis not present

## 2021-10-12 ENCOUNTER — Encounter: Payer: Self-pay | Admitting: Internal Medicine

## 2021-10-15 NOTE — Addendum Note (Signed)
Addended by: Francis Gaines on: 10/15/2021 01:31 PM   Modules accepted: Level of Service

## 2021-10-24 ENCOUNTER — Ambulatory Visit (INDEPENDENT_AMBULATORY_CARE_PROVIDER_SITE_OTHER): Payer: BC Managed Care – PPO | Admitting: Gastroenterology

## 2021-10-24 ENCOUNTER — Encounter: Payer: Self-pay | Admitting: Gastroenterology

## 2021-10-24 VITALS — BP 130/80 | HR 82 | Ht 67.0 in | Wt 232.5 lb

## 2021-10-24 DIAGNOSIS — K5080 Crohn's disease of both small and large intestine without complications: Secondary | ICD-10-CM

## 2021-10-24 NOTE — Progress Notes (Signed)
Review of pertinent gastrointestinal problems: 1.Crohn's ileocolitis: Workup at [facility]land KitchenMar[facility]ology 7 testing 02/2008 "pattern consistent with IBD.Marland KitchenMarland KitchenCrohn's." Colonoscopy 01/2008 showed terminal ileum and right colon ulcerations, pathology c/w acute and chronic ileitis, colitis, also HP polyps. Colonoscopy Path report from 2006 showed HP polyps. Office note 05/2009 documented previous C. diff colitis (treated empirically and also after toxin +). 2012 doing well on oral mesalamine 3.6 gms/day. ? Worsening of disease 03/2013, c. Diff PCR neg; increased mesalamine to 4.8gm daily. Colonoscopy 04/2012 found essentially normal colon but moderate to severe ileitis (path confirmed acute and chronic inflammation), started on 234m azathiaprine daily Had erythema nodosum 2014, early, improved with steroids. 11/2012 doing very well on 200 azathiaprine daily. 04/2013: urgency, straining: felt to be more functional then IBD related; fiber supplements stopped.  12/2013 doing well on 200 azathiaprine daily. 03/2015 MRI enterograophy "mild wall thickening in TI and cecum...active crohn's disease", elevated inflammatory markers and so azathiaprine dose increased to 2538mdaily; Decreased back to 20094maily due to blood counts, LFTs. Started humira 11/2016 11m68mW Labs 10/2016: TB quant gold negative, hep B S Ag negative, Hep B S Ab positive TB quant gold 11/2019 negative Flare early 2023 with elevated inflammatory markers, elevated fecal calprotectin, nonbloody diarrhea, abdominal discomforts, GI pathogen panel was negative.  05/2021 Humira antibody negative, Humira drug level 4.2 (lower than ideal) 05/2021 Humira dosing increased to 80 mg every other week 2. Dysphasia: EGD August, 2011 found Schatzki's ring that was dilated to 20 mm, small hiatal hernia, tortuous esophagus. Swallowing much improved. March 2012 PPI only every other day. Repeat EGD 03/2013 for dysphagia showed 4cm HH, tortuous distal esophagus,  Schatzki's ring that was dilated to 20mm55m Thrombosed external hemorrhoid 2016, this was around the time of constipation related to pain medicine after a knee surgery. Resolve with conservative therapy 4. C. Diff + PCR 12/2016; diarrhea; treated with 2 week oral vanc course; diarrhea significantly improved after the antibiotics.   HPI: This is a very pleasant 66 ye43 old woman    I last saw her here in the office about 3 months ago.  She was doing better while still on prednisone and having her Humira dose increased from 40 mg every other week to 80 mg every other week.  I asked her to start tapering her prednisone by about 10 mg/week.  Her weight is up 6 pounds since her last office visit here 3 months ago  She tapered the prednisone by 10 mg/week as instructed and had no difficulties with tapering it.  Her bowels are back to normal and she is having no abdominal pains.  She has 1 or 2 soft solid nonbloody bowel movements in the morning just about every day.  No diarrhea, no cramping, no abdominal pains.  No bleeding.   ROS: complete GI ROS as described in HPI, all other review negative.  Constitutional:  No unintentional weight loss   Past Medical History:  Diagnosis Date   Crohn's disease (HCC) [place] crowns present    High cholesterol    History of hiatal hernia    HTN (hypertension) 04/27/2018   Hyperlipidemia 01/13/2014   Osteoarthritis of knee, unilateral 09/2014   right    Patellofemoral arthritis of right knee 10/07/2014   Seasonal allergies     Past Surgical History:  Procedure Laterality Date   APPENDECTOMY     CESAREAN SECTION     x 3   CHOLECYSTECTOMY     COLONOSCOPY  ESOPHAGOGASTRODUODENOSCOPY (EGD) WITH ESOPHAGEAL DILATION     x 2   PATELLA-FEMORAL ARTHROPLASTY Right 10/07/2014   Procedure: RIGHT PATELLA-FEMORAL ARTHROPLASTY;  Surgeon: Marchia Bond, MD;  Location: [facility];  Service: Orthopedics;  Laterality: Right;   TONSILLECTOMY AND  ADENOIDECTOMY     WISDOM TOOTH EXTRACTION      Current Outpatient Medications  Medication Instructions   azelastine (ASTELIN) 0.1 % nasal spray No dose, route, or frequency recorded.   CALCIUM CITRATE PO 1 tablet, 2 times daily   Cholecalciferol (VITAMIN D-3 PO) 1 capsule, Daily at bedtime   Cyanocobalamin (VITAMIN B-12 PO) 1 tablet, Daily   DYMISTA 137-50 MCG/ACT SUSP 1 spray, Nasal, As needed   ezetimibe (ZETIA) 10 mg, Oral, Daily   fexofenadine (ALLEGRA) 180 mg, Daily   fluticasone (CUTIVATE) 0.005 % ointment No dose, route, or frequency recorded.   fluticasone (FLONASE) 50 MCG/ACT nasal spray 1-2 sprays, Each Nare, Daily   Humira Pen 80 mg, Subcutaneous, Every 14 days   Iron-Folic Acid-Vit O67 (IRON FORMULA PO) Oral   levocetirizine (XYZAL) 5 mg, Every evening   montelukast (SINGULAIR) 10 mg, Daily at bedtime   Multiple Vitamin (MULITIVITAMIN WITH MINERALS) TABS 1 tablet, 2 times daily   Omega-3 Fatty Acids (FISH OIL PO) 2 capsules, 2 times daily   rosuvastatin (CRESTOR) 20 mg, Oral, Daily    Allergies as of 10/24/2021   (No Known Allergies)    Family History  Problem Relation Age of Onset   Arthritis Mother    Dementia Mother    Heart disease Father 35   Hyperlipidemia Father    Hypertension Father    Heart attack Father    Heart disease Brother 51       MI   Parkinson's disease Brother    Stroke Maternal Grandfather    Asthma Son    Colon cancer Neg Hx    Esophageal cancer Neg Hx    Pancreatic cancer Neg Hx    Stomach cancer Neg Hx    Liver disease Neg Hx     Social History   Socioeconomic History   Marital status: Married    Spouse name: Not on file   Number of children: 3   Years of education: Not on file   Highest education level: Not on file  Occupational History   Occupation: Retired Pharmacist, hospital  Tobacco Use   Smoking status: Never   Smokeless tobacco: Never  Vaping Use   Vaping Use: Never used  Substance and Sexual Activity   Alcohol use: Yes     Comment: weekends   Drug use: No   Sexual activity: Yes    Birth control/protection: Post-menopausal  Other Topics Concern   Not on file  Social History Narrative   Not on file   Social Determinants of Health   Financial Resource Strain: Not on file  Food Insecurity: Not on file  Transportation Needs: Not on file  Physical Activity: Not on file  Stress: Not on file  Social Connections: Not on file  Intimate Partner Violence: Not on file     Physical Exam: BP 130/80   Pulse 82   Ht 5' 7"  (1.702 m)   Wt 232 lb 8 oz (105.5 kg)   SpO2 98%   BMI 36.41 kg/m  Constitutional: generally well-appearing Psychiatric: alert and oriented x3 Abdomen: soft, nontender, nondistended, no obvious ascites, no peritoneal signs, normal bowel sounds No peripheral edema noted in lower extremities  Assessment and plan: 66 y.o. female with Crohn's ileocolitis  Seems like we recaptured her in good control after flaring symptoms and testing showed that her Humira dose was suboptimal.  Prednisone and higher dose of Humira at 80 mg every other week has gotten her back into clinical remission.  She will continue the Humira at this dose indefinitely.  She will return to see me in 6 months and sooner if needed.  Please see the "Patient Instructions" section for addition details about the plan.  Owens Loffler, MD [facility] 10/24/2021, 8:51 AM   Total time on date of encounter was 20 minutes (this included time spent preparing to see the patient reviewing records; obtaining and/or reviewing separately obtained history; performing a medically appropriate exam and/or evaluation; counseling and educating the patient and family if present; ordering medications, tests or procedures if applicable; and documenting clinical information in the health record).

## 2021-10-24 NOTE — Patient Instructions (Addendum)
If you are age 67 or older, your body mass index should be between 23-30. Your Body mass index is 36.41 kg/m. If this is out of the aforementioned range listed, please consider follow up with your Primary Care Provider. ________________________________________________________  The [facility] providers would like to encourage you to use [facility] to communicate with providers for non-urgent requests or questions.  Due to long hold times on the telephone, sending your provider a message by [facility] may be a faster and more efficient way to get a response.  Please allow 48 business hours for a response.  Please remember that this is for non-urgent requests.  _______________________________________________________  Dennis Bast will need a follow up appointment in 6 months (January 2024).  We will contact you to schedule this appointment.  Please continue taking Humira 1m every other week.  [facility] you for [facility]ing [facility] with your [facility]  Dr JArdis Hughs

## 2021-11-08 DIAGNOSIS — Z1231 Encounter for screening mammogram for malignant neoplasm of breast: Secondary | ICD-10-CM | POA: Diagnosis not present

## 2021-11-08 DIAGNOSIS — Z6837 Body mass index (BMI) 37.0-37.9, adult: Secondary | ICD-10-CM | POA: Diagnosis not present

## 2021-11-08 DIAGNOSIS — Z01419 Encounter for gynecological examination (general) (routine) without abnormal findings: Secondary | ICD-10-CM | POA: Diagnosis not present

## 2021-11-27 DIAGNOSIS — H353131 Nonexudative age-related macular degeneration, bilateral, early dry stage: Secondary | ICD-10-CM | POA: Diagnosis not present

## 2021-12-10 DIAGNOSIS — M19012 Primary osteoarthritis, left shoulder: Secondary | ICD-10-CM | POA: Diagnosis not present

## 2021-12-19 ENCOUNTER — Encounter: Payer: Self-pay | Admitting: Internal Medicine

## 2021-12-29 ENCOUNTER — Other Ambulatory Visit: Payer: Self-pay | Admitting: Cardiovascular Disease

## 2021-12-31 ENCOUNTER — Telehealth: Payer: Self-pay | Admitting: Cardiovascular Disease

## 2021-12-31 MED ORDER — ROSUVASTATIN CALCIUM 20 MG PO TABS: ORAL_TABLET | ORAL | 1 refills | Status: DC

## 2021-12-31 NOTE — Telephone Encounter (Signed)
Pt's medication was sent to pt's pharmacy as requested. Confirmation received.  °

## 2021-12-31 NOTE — Telephone Encounter (Signed)
*  STAT* If patient is at the pharmacy, call can be transferred to refill team.   1. Which medications need to be refilled? (please list name of each medication and dose if known) rosuvastatin (CRESTOR) 20 MG tablet  2. Which pharmacy/location (including street and city if local pharmacy) is medication to be sent to? [place], [place] - [address] AT [address]  3. Do they need a 30 day or 90 day supply? 90 day  Patient states she was contacted by the pharmacy that the refill was denied.

## 2022-01-02 DIAGNOSIS — J301 Allergic rhinitis due to pollen: Secondary | ICD-10-CM | POA: Diagnosis not present

## 2022-01-02 DIAGNOSIS — K219 Gastro-esophageal reflux disease without esophagitis: Secondary | ICD-10-CM | POA: Diagnosis not present

## 2022-01-22 ENCOUNTER — Encounter: Payer: Self-pay | Admitting: Internal Medicine

## 2022-02-08 ENCOUNTER — Telehealth: Payer: Self-pay | Admitting: Gastroenterology

## 2022-02-08 MED ORDER — HUMIRA (2 PEN) 80 MG/0.8ML ~~LOC~~ PNKT: 80.0000 mg | PEN_INJECTOR | SUBCUTANEOUS | 11 refills | Status: DC

## 2022-02-08 NOTE — Telephone Encounter (Signed)
Yes can refill Humira. She should be seen in January in the office for reassessment of her Crohn's otherwise. thanks

## 2022-02-08 NOTE — Telephone Encounter (Signed)
Dr Havery Moros this pt is calling to have her Humira refilled with a new pharmacy. She is a Dr Ardis Hughs pt, as DOD can you ok the refill? She was last seen in July 2023.

## 2022-02-08 NOTE — Telephone Encounter (Signed)
Patient called states she would like a call back to discuss her medications. Please a call on [phone]. States her medication need to be call in to [facility] [phone]. Please call to advise.

## 2022-02-08 NOTE — Telephone Encounter (Signed)
I have sent the refill to the pharmacy as requested.  The pt has been advised

## 2022-02-26 ENCOUNTER — Encounter: Payer: Self-pay | Admitting: Internal Medicine

## 2022-03-04 ENCOUNTER — Other Ambulatory Visit: Payer: Self-pay

## 2022-03-04 MED ORDER — EZETIMIBE 10 MG PO TABS: 10.0000 mg | ORAL_TABLET | Freq: Every day | ORAL | 0 refills | Status: DC

## 2022-03-17 ENCOUNTER — Encounter: Payer: Self-pay | Admitting: Cardiovascular Disease

## 2022-03-17 NOTE — Progress Notes (Unsigned)
Cardiology Office Note:    Date:  03/18/2022   ID:  Connie Porter, Connie Porter [birth year], MRN [id number]  PCP:  Marcellina Millin   [facility] Providers Cardiologist:  Tandra Rosado    Referring MD: Marcellina Millin   Chief Complaint  Patient presents with   Hyperlipidemia    HSept. 2, 2022   Connie Porter is a 66 y.o. female with a hx of chest pain.  We were asked to see her today by Harland Dingwall NP for further evaluation of her chest pain and hyperlipidemia.  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 , special ed for 30 year, [place])   Has Crohns disease , takes Humira   Dec. 2, 2022 Connie Porter is seen for follow up visit  Strong fam hx of CAD Coronary CT angio revealed A large hiatal hernia Coronary calcium score is 5, which places the patient in the 54th percentile for age and sex matched control  Right Coronary Artery: Minimal mixed atherosclerotic plaque in the mid RCA, <25% stenosis.   Left Main Coronary Artery: Minimal mixed atherosclerotic plaque in the distal left main, <25% stenosis.   Left Anterior Descending Coronary Artery: Minimal mixed atherosclerotic plaque in the ostial LAD, <25% stenosis.   Left Circumflex Artery: No detectable plaque or stenosis.   Aorta: Borderline dilation, 39 mm at the mid ascending aorta (level of the PA bifurcation) measured double oblique. Trivial calcifications. No dissection.  Wt is 225 lbs   Dec. 4, 2023 Connie Porter is seen for follow up of her mild CAD / coronary artery calcification , hyperlipidemia  Wt is 204 lbs   Needs a shoulder replacement ,  is in with [place] weight loss  She is at low risk for her shoulder replacement .   Past Medical History:  Diagnosis Date    Crohn's disease ([place])    Dental crowns present    High cholesterol    History of hiatal hernia    HTN (hypertension) 04/27/2018   Hyperlipidemia 01/13/2014   Osteoarthritis of knee, unilateral 09/2014   right    Patellofemoral arthritis of right knee 10/07/2014   Seasonal allergies     Past Surgical History:  Procedure Laterality Date   APPENDECTOMY     CESAREAN SECTION     x 3   CHOLECYSTECTOMY     COLONOSCOPY     ESOPHAGOGASTRODUODENOSCOPY (EGD) WITH ESOPHAGEAL DILATION     x 2   PATELLA-FEMORAL ARTHROPLASTY Right 10/07/2014   Procedure: RIGHT PATELLA-FEMORAL ARTHROPLASTY;  Surgeon: Marchia Bond, MD;  Location: [place];  Service: Orthopedics;  Laterality: Right;   TONSILLECTOMY AND ADENOIDECTOMY     WISDOM TOOTH EXTRACTION      Current Medications: Current Meds  Medication Sig   Adalimumab (HUMIRA PEN) 80 MG/0.8ML PNKT Inject 80 mg into the skin every 14 (fourteen) days.   azelastine (ASTELIN) 0.1 % nasal spray    CALCIUM CITRATE PO Take 1 tablet by mouth 2 (two) times daily.   Cholecalciferol (VITAMIN D-3 PO) Take 1 capsule by mouth at bedtime.   Cyanocobalamin (VITAMIN B-12 PO) Take 1 tablet by mouth daily.   DYMISTA 137-50 MCG/ACT SUSP Place 1  spray into the nose as needed.   fexofenadine (ALLEGRA) 180 MG tablet Take 180 mg by mouth daily.   fluticasone (CUTIVATE) 0.005 % ointment    fluticasone (FLONASE) 50 MCG/ACT nasal spray Place 1-2 sprays into both nostrils daily.   Iron-Folic Acid-Vit N05 (IRON FORMULA PO) Take by mouth.   levocetirizine (XYZAL) 5 MG tablet Take 5 mg by mouth every evening.   montelukast (SINGULAIR) 10 MG tablet Take 10 mg by mouth at bedtime.   Multiple Vitamin (MULITIVITAMIN WITH MINERALS) TABS Take 1 tablet by mouth 2 (two) times daily.   Omega-3 Fatty Acids (FISH OIL PO) Take 2 capsules by mouth 2 (two) times daily.    [DISCONTINUED] ezetimibe (ZETIA) 10 MG tablet Take 1 tablet (10 mg total) by mouth daily. Please keep your  upcoming appointment in December for future refills. Thank you   [DISCONTINUED] rosuvastatin (CRESTOR) 20 MG tablet TAKE 1 TABLET(20 MG) BY MOUTH DAILY     Allergies:   Patient has no known allergies.   Social History   Socioeconomic History   Marital status: Married    Spouse name: Not on file   Number of children: 3   Years of education: Not on file   Highest education level: Not on file  Occupational History   Occupation: Retired Pharmacist, hospital  Tobacco Use   Smoking status: Never   Smokeless tobacco: Never  Vaping Use   Vaping Use: Never used  Substance and Sexual Activity   Alcohol use: Yes    Comment: weekends   Drug use: No   Sexual activity: Yes    Birth control/protection: Post-menopausal  Other Topics Concern   Not on file  Social History Narrative   Not on file   Social Determinants of Health   Financial Resource Strain: Not on file  Food Insecurity: Not on file  Transportation Needs: Not on file  Physical Activity: Not on file  Stress: Not on file  Social Connections: Not on file     Family History: The patient's family history includes Arthritis in her mother; Asthma in her son; Dementia in her mother; Heart attack in her father; Heart disease (age of onset: 27) in her brother; Heart disease (age of onset: 26) in her father; Hyperlipidemia in her father; Hypertension in her father; Parkinson's disease in her brother; Stroke in her maternal grandfather. There is no history of Colon cancer, Esophageal cancer, Pancreatic cancer, Stomach cancer, or Liver disease.  ROS:   Please see the history of present illness.     All other systems reviewed and are negative.  EKGs/Labs/Other Studies Reviewed:    The following studies were reviewed today:   EKG:      Recent Labs: 10/02/2021: ALT 17; BUN 17; Creatinine, Ser 0.84; Hemoglobin 13.6; Platelets 294; Potassium 4.5; Sodium 143  Recent Lipid Panel    Component Value Date/Time   CHOL 207 (H) 10/02/2021 1019    TRIG 112 10/02/2021 1019   HDL 106 10/02/2021 1019   CHOLHDL 2.0 10/02/2021 1019   CHOLHDL 2.2 02/07/2017 1128   VLDL 10 02/23/2016 1056   LDLCALC 82 10/02/2021 1019   LDLCALC 95 02/07/2017 1128     Risk Assessment/Calculations:           Physical Exam:     Physical Exam: Blood pressure 118/70, pulse 74, height 5' 7"  (1.702 m), weight 204 lb 3.2 oz (92.6 kg), SpO2 96 %.       GEN:  Well nourished, well developed in no acute distress HEENT:  Normal NECK: No JVD; No carotid bruits LYMPHATICS: No lymphadenopathy CARDIAC: RRR , no murmurs, rubs, gallops RESPIRATORY:  Clear to auscultation without rales, wheezing or rhonchi  ABDOMEN: Soft, non-tender, non-distended MUSCULOSKELETAL:  No edema; No deformity  SKIN: Warm and dry NEUROLOGIC:  Alert and oriented x 3   ASSESSMENT:    1. Mixed hyperlipidemia   2. Coronary artery disease involving native coronary artery of native heart without angina pectoris     PLAN:      Exertional chest discomfort:   has minimal CAD .  Her LDL goal is less than 55.  Her current LDL is 45 so she is at goal.  2.  Obesity: She continues to lose weight.  She is feeling better.  She wants to lose weight before getting her shoulder replaced.  3.  Upcoming shoulder surgery: She wants to have a shoulder replacement.  She is at low risk for her upcoming shoulder replacement surgery.      2.  Hyperlipidemia:  Coronary calcium score is 5, which places the patient in the 54th percentile for age and sex matched control Given her strong family hx,     Medication Adjustments/Labs and Tests Ordered: Current medicines are reviewed at length with the patient today.  Concerns regarding medicines are outlined above.  Orders Placed This Encounter  Procedures   Basic metabolic panel   Lipid panel   ALT   EKG 12-Lead    Meds ordered this encounter  Medications   ezetimibe (ZETIA) 10 MG tablet    Sig: Take 1 tablet (10 mg total) by mouth  daily.    Dispense:  90 tablet    Refill:  3   rosuvastatin (CRESTOR) 20 MG tablet    Sig: TAKE 1 TABLET(20 MG) BY MOUTH DAILY    Dispense:  90 tablet    Refill:  3     Patient Instructions  Medication Instructions:  Your physician recommends that you continue on your current medications as directed. Please refer to the Current Medication list given to you today.  *If you need a refill on your cardiac medications before your next appointment, please call your pharmacy*   Lab Work: Lipids, ALT, BMET today If you have labs (blood work) drawn today and your tests are completely normal, you will receive your results only by: [place] (if you have MyChart) OR A paper copy in the mail If you have any lab test that is abnormal or we need to change your treatment, we will call you to review the results.   Testing/Procedures: NONE   Follow-Up: At [facility], you and your health needs are our priority.  As part of our continuing mission to provide you with exceptional heart care, we have created designated Provider Care Teams.  These Care Teams include your primary Cardiologist (physician) and Advanced Practice Providers (APPs -  Physician Assistants and Nurse Practitioners) who all work together to provide you with the care you need, when you need it.  We recommend signing up for the patient portal called "MyChart".  Sign up information is provided on this After Visit Summary.  MyChart is used to connect with patients for Virtual Visits (Telemedicine).  Patients are able to view lab/test results, encounter notes, upcoming appointments, etc.  Non-urgent messages can be sent to your provider as well.   To learn more about what you can do with MyChart, go to [email or web address].    Your next appointment:   1 year(s)  The format for  your next appointment:   In Person  Provider:   Mertie Moores, MD       Important Information About Sugar          Signed, Mertie Moores, MD  03/18/2022 4:49 PM    [place]

## 2022-03-18 ENCOUNTER — Encounter: Payer: Self-pay | Admitting: Cardiovascular Disease

## 2022-03-18 ENCOUNTER — Ambulatory Visit: Payer: Medicare Other | Attending: Cardiovascular Disease | Admitting: Cardiovascular Disease

## 2022-03-18 VITALS — BP 118/70 | HR 74 | Ht 67.0 in | Wt 204.2 lb

## 2022-03-18 DIAGNOSIS — E782 Mixed hyperlipidemia: Secondary | ICD-10-CM | POA: Insufficient documentation

## 2022-03-18 DIAGNOSIS — I2510 Atherosclerotic heart disease of native coronary artery without angina pectoris: Secondary | ICD-10-CM | POA: Insufficient documentation

## 2022-03-18 MED ORDER — ROSUVASTATIN CALCIUM 20 MG PO TABS
ORAL_TABLET | ORAL | 3 refills | Status: DC
Start: 2022-03-18 — End: 2023-04-11

## 2022-03-18 MED ORDER — EZETIMIBE 10 MG PO TABS: 10.0000 mg | ORAL_TABLET | Freq: Every day | ORAL | 3 refills | Status: DC

## 2022-03-18 NOTE — Patient Instructions (Signed)
Medication Instructions:  Your physician recommends that you continue on your current medications as directed. Please refer to the Current Medication list given to you today.  *If you need a refill on your cardiac medications before your next appointment, please call your pharmacy*   Lab Work: Lipids, ALT, BMET today If you have labs (blood work) drawn today and your tests are completely normal, you will receive your results only by: [place] (if you have MyChart) OR A paper copy in the mail If you have any lab test that is abnormal or we need to change your treatment, we will call you to review the results.   Testing/Procedures: NONE   Follow-Up: At [facility], you and your health needs are our priority.  As part of our continuing mission to provide you with exceptional heart care, we have created designated Provider Care Teams.  These Care Teams include your primary Cardiologist (physician) and Advanced Practice Providers (APPs -  Physician Assistants and Nurse Practitioners) who all work together to provide you with the care you need, when you need it.  We recommend signing up for the patient portal called "MyChart".  Sign up information is provided on this After Visit Summary.  MyChart is used to connect with patients for Virtual Visits (Telemedicine).  Patients are able to view lab/test results, encounter notes, upcoming appointments, etc.  Non-urgent messages can be sent to your provider as well.   To learn more about what you can do with MyChart, go to [email or web address].    Your next appointment:   1 year(s)  The format for your next appointment:   In Person  Provider:   Mertie Moores, MD       Important Information About Sugar

## 2022-03-19 LAB — BASIC METABOLIC PANEL
BUN/Creatinine Ratio: 18 (ref 12–28)
BUN: 14 mg/dL (ref 8–27)
CO2: 24 mmol/L (ref 20–29)
Calcium: 9.4 mg/dL (ref 8.7–10.3)
Chloride: 100 mmol/L (ref 96–106)
Creatinine, Ser: 0.8 mg/dL (ref 0.57–1.00)
Glucose: 96 mg/dL (ref 70–99)
Potassium: 4.2 mmol/L (ref 3.5–5.2)
Sodium: 139 mmol/L (ref 134–144)
eGFR: 81 mL/min/{1.73_m2} (ref >59)

## 2022-03-19 LAB — LIPID PANEL
Chol/HDL Ratio: 1.7 ratio (ref 0.0–4.4)
Cholesterol, Total: 104 mg/dL (ref 100–199)
HDL: 62 mg/dL (ref >39)
LDL Chol Calc (NIH): 27 mg/dL (ref 0–99)
Triglycerides: 75 mg/dL (ref 0–149)
VLDL Cholesterol Cal: 15 mg/dL (ref 5–40)

## 2022-03-19 LAB — ALT: ALT: 11 IU/L (ref 0–32)

## 2022-05-17 ENCOUNTER — Telehealth (INDEPENDENT_AMBULATORY_CARE_PROVIDER_SITE_OTHER): Payer: Medicare Other | Admitting: Medical

## 2022-05-17 VITALS — Wt 183.0 lb

## 2022-05-17 DIAGNOSIS — J019 Acute sinusitis, unspecified: Secondary | ICD-10-CM

## 2022-05-17 MED ORDER — AZITHROMYCIN 250 MG PO TABS: ORAL_TABLET | ORAL | 0 refills | Status: DC

## 2022-05-17 NOTE — Progress Notes (Signed)
Subjective:     Patient ID: Connie Porter, female   DOB: [birth year], 67 y.o.   MRN: [id number]  This visit type was conducted due to national recommendations for restrictions regarding the COVID-19 Pandemic (e.g. social distancing) in an effort to limit this patient's exposure and mitigate transmission in our community.  Due to their co-morbid illnesses, this patient is at least at moderate risk for complications without adequate follow up.  This format is felt to be most appropriate for this patient at this time.    Documentation for virtual audio and video telecommunications through [place] encounter:  The patient was located at home. The provider was located in the office. The patient did consent to this visit and is aware of possible charges through their insurance for this visit.  The other persons participating in this telemedicine service were none. Time spent on call was 20 minutes and in review of previous records 20 minutes total.  This virtual service is not related to other E/M service within previous 7 days.   HPI Chief Complaint  Patient presents with   sinus infection    Sinus infection- face feel pressure- nostrils swelling and mucous yellow and green, 2 negative covid tests   Virtual for sinus infection.  She notes that she gets a sinus infection about this time every year.  She has about 5-day history of head pressure, sinus pressure facial puffiness, thick yellow-green mucus coming out of the nose, teeth ache, facial pressure.  She has had 2 negative COVID test this week.  She came to our office earlier a few hours ago for a flu test which was also negative.  She is using Mucinex D.  Some throat irritation due to postnasal drip.  No fever, no chills, no body aches.  No nausea vomiting or diarrhea.  No shortness of breath or wheezing.  Non-smoker.  Her last Humira injection was 2 weeks ago.  Due to have a shot tomorrow.  She has a history of hypertension but not on  medicine since losing 55 pounds.  No other aggravating or relieving factors. No other complaint.  Past Medical History:  Diagnosis Date   Crohn's disease ([place])    Dental crowns present    High cholesterol    History of hiatal hernia    HTN (hypertension) 04/27/2018   Hyperlipidemia 01/13/2014   Osteoarthritis of knee, unilateral 09/2014   right    Patellofemoral arthritis of right knee 10/07/2014   Seasonal allergies    Current Outpatient Medications on File Prior to Visit  Medication Sig Dispense Refill   Adalimumab (HUMIRA PEN) 80 MG/0.8ML PNKT Inject 80 mg into the skin every 14 (fourteen) days. 2 each 11   CALCIUM CITRATE PO Take 1 tablet by mouth 2 (two) times daily.     Cholecalciferol (VITAMIN D-3 PO) Take 1 capsule by mouth at bedtime.     Cyanocobalamin (VITAMIN B-12 PO) Take 1 tablet by mouth daily.     DYMISTA 137-50 MCG/ACT SUSP Place 1 spray into the nose as needed.  6   ezetimibe (ZETIA) 10 MG tablet Take 1 tablet (10 mg total) by mouth daily. 90 tablet 3   fexofenadine (ALLEGRA) 180 MG tablet Take 180 mg by mouth daily.     fluticasone (CUTIVATE) 0.005 % ointment      fluticasone (FLONASE) 50 MCG/ACT nasal spray Place 1-2 sprays into both nostrils daily.     Iron-Folic Acid-Vit K16 (IRON FORMULA PO) Take by mouth.     levocetirizine (XYZAL)  5 MG tablet Take 5 mg by mouth every evening.     montelukast (SINGULAIR) 10 MG tablet Take 10 mg by mouth at bedtime.     Multiple Vitamin (MULITIVITAMIN WITH MINERALS) TABS Take 1 tablet by mouth 2 (two) times daily.     Omega-3 Fatty Acids (FISH OIL PO) Take 2 capsules by mouth 2 (two) times daily.      azelastine (ASTELIN) 0.1 % nasal spray      rosuvastatin (CRESTOR) 20 MG tablet TAKE 1 TABLET(20 MG) BY MOUTH DAILY 90 tablet 3   No current facility-administered medications on file prior to visit.     Review of Systems As in subjective    Objective:   Physical Exam Due to coronavirus pandemic stay at home measures,  patient visit was virtual and they were not examined in person.   Wt 183 lb (83 kg)   BMI 28.66 kg/m   Gen: wd, wn, nad No labored breathin or wheezing Sinus/congested sounding      Assessment:     Encounter Diagnosis  Name Primary?   Acute sinusitis, recurrence not specified, unspecified location Yes       Plan:     We discussed symptoms and concerns.  Discussed limitation of virtual consult.  Begin medication below which she usually gets good relief from.  Continue Mucinex, good hydration, nasal saline.  If not much improved within the next 5 days then call or recheck  Connie Porter was seen today for sinus infection.  Diagnoses and all orders for this visit:  Acute sinusitis, recurrence not specified, unspecified location  Other orders -     azithromycin (ZITHROMAX) 250 MG tablet; 2 tablets day 1, then 1 tablet days 2-4  F/u prn

## 2022-05-21 ENCOUNTER — Encounter: Payer: Self-pay | Admitting: Gastroenterology

## 2022-05-21 ENCOUNTER — Ambulatory Visit (INDEPENDENT_AMBULATORY_CARE_PROVIDER_SITE_OTHER): Payer: Medicare Other | Admitting: Gastroenterology

## 2022-05-21 VITALS — BP 132/86 | HR 58 | Ht 67.0 in | Wt 185.0 lb

## 2022-05-21 DIAGNOSIS — Z79899 Other long term (current) drug therapy: Secondary | ICD-10-CM

## 2022-05-21 DIAGNOSIS — K5080 Crohn's disease of both small and large intestine without complications: Secondary | ICD-10-CM

## 2022-05-21 MED ORDER — SUTAB 1479-225-188 MG PO TABS: 24.0000 | ORAL_TABLET | Freq: Once | ORAL | 0 refills | Status: AC

## 2022-05-21 NOTE — Patient Instructions (Addendum)
If you are age 68 or older, your body mass index should be between 23-30. Your Body mass index is 28.98 kg/m. If this is out of the aforementioned range listed, please consider follow up with your Primary Care Provider.  If you are age 67 or younger, your body mass index should be between 19-25. Your Body mass index is 28.98 kg/m. If this is out of the aformentioned range listed, please consider follow up with your Primary Care Provider.   ________________________________________________________   Connie Porter  Please go to the lab the Friday afternoon (they are open until 5:15pm) before your next Humira injection on Saturday.  You have been scheduled for a colonoscopy. Please follow written instructions given to you at your visit today.  Please pick up your prep supplies at the pharmacy within the next 1-3 days. If you use inhalers (even only as needed), please bring them with you on the day of your procedure.  Thank you for entrusting me with your care and for choosing [facility], Dr. Chetek Cellar

## 2022-05-21 NOTE — Progress Notes (Signed)
HPI :  67 y/o female here for follow-up visit for ileocolonic Crohn's disease.  She normally follows with Dr. Ardis Hughs, he has nicely summarized her history as below.  In short, diagnosed with Crohn's disease in 2009.  Historically had been on some azathioprine back in 2014, she had erythema nodosum, ultimately transitioned to Humira when she had active disease on thiopurine monotherapy, in August 2018.  She has essentially been on Humira since that time with very good control of her symptoms.  Her index symptoms of Crohn's are loose stools and abdominal pain.  She is reported generally good control of her disease on Humira 40 mg every other week until last year when she had a flare of symptoms.  She has had a history of C. difficile in the past.  She tested negative for C. difficile with an elevated fecal calprotectin to 445 back in February of last year.  Dr. Ardis Hughs checked her Humira levels and her level was subtherapeutic without antibodies.  Her dose was increased to 80 mg every 2 weeks.  She states since that time her symptoms have been well-controlled and she is feeling well.  She tolerates the Humira well.  Denies any problems with her bowels at this time or rectal bleeding.  No abdominal pains are bothering her.  She denies any routine use of NSAIDs.  She does not smoke tobacco.  She denies any cardiopulmonary symptoms today.  She has a shoulder replacement pending at the end of March.  Her last colonoscopy was in 2014, she is due for surveillance.  In regards to her immunization history we reviewed that today.  She gets her flu shot yearly, has had an RSV vaccine, COVID-vaccine, Pneumovax, Shingrix, all up-to-date and immunizations tab.    Review of pertinent gastrointestinal problems: 1.Crohn's ileocolitis: Workup at [facility] testing 02/2008 "pattern consistent with IBD.Marland KitchenMarland KitchenCrohn's." Colonoscopy 01/2008 showed terminal ileum and right colon ulcerations, pathology c/w  acute and chronic ileitis, colitis, also HP polyps. Colonoscopy Path report from 2006 showed HP polyps. Office note 05/2009 documented previous C. diff colitis (treated empirically and also after toxin +). 2012 doing well on oral mesalamine 3.6 gms/day. ? Worsening of disease 03/2013, c. Diff PCR neg; increased mesalamine to 4.8gm daily. Colonoscopy 04/2012 found essentially normal colon but moderate to severe ileitis (path confirmed acute and chronic inflammation), started on 200mg  azathiaprine daily Had erythema nodosum 2014, early, improved with steroids. 11/2012 doing very well on 200 azathiaprine daily. 04/2013: urgency, straining: felt to be more functional then IBD related; fiber supplements stopped.  12/2013 doing well on 200 azathiaprine daily. 03/2015 MRI enterograophy "mild wall thickening in TI and cecum...active crohn's disease", elevated inflammatory markers and so azathiaprine dose increased to 250mg  daily; Decreased back to 200mg  daily due to blood counts, LFTs. Started humira 11/2016 40mg  QOW Labs 10/2016: TB quant gold negative, hep B S Ag negative, Hep B S Ab positive TB quant gold 11/2019 negative Flare early 2023 with elevated inflammatory markers, elevated fecal calprotectin, nonbloody diarrhea, abdominal discomforts, GI pathogen panel was negative.  05/2021 Humira antibody negative, Humira drug level 4.2 (lower than ideal) 05/2021 Humira dosing increased to 80 mg every other week 2. Dysphasia: EGD August, 2011 found Schatzki's ring that was dilated to 20 mm, small hiatal hernia, tortuous esophagus. Swallowing much improved. March 2012 PPI only every other day. Repeat EGD 03/2013 for dysphagia showed 4cm HH, tortuous distal esophagus, Schatzki's ring that was dilated to 49mm. 3. Thrombosed external hemorrhoid 2016, this  was around the time of constipation related to pain medicine after a knee surgery. Resolve with conservative therapy 4. C. Diff + PCR 12/2016; diarrhea; treated with 2 week oral  vanc course; diarrhea significantly improved after the antibiotics.      QG 06/2021 - negative  Humira 06/01/21 - level of 4.2, undetectable antibody  Fecal calprotectin 05/23/21 - 445    Past Medical History:  Diagnosis Date   Crohn's disease ([place])    Dental crowns present    High cholesterol    History of hiatal hernia    HTN (hypertension) 04/27/2018   Hyperlipidemia 01/13/2014   Osteoarthritis of knee, unilateral 09/2014   right    Patellofemoral arthritis of right knee 10/07/2014   Seasonal allergies      Past Surgical History:  Procedure Laterality Date   APPENDECTOMY     CESAREAN SECTION     x 3   CHOLECYSTECTOMY     COLONOSCOPY     ESOPHAGOGASTRODUODENOSCOPY (EGD) WITH ESOPHAGEAL DILATION     x 2   PATELLA-FEMORAL ARTHROPLASTY Right 10/07/2014   Procedure: RIGHT PATELLA-FEMORAL ARTHROPLASTY;  Surgeon: Marchia Bond, MD;  Location: [place];  Service: Orthopedics;  Laterality: Right;   TONSILLECTOMY AND ADENOIDECTOMY     WISDOM TOOTH EXTRACTION     Family History  Problem Relation Age of Onset   Arthritis Mother    Dementia Mother    Heart disease Father 74   Hyperlipidemia Father    Hypertension Father    Heart attack Father    Heart disease Brother 56       MI   Parkinson's disease Brother    Stroke Maternal Grandfather    Asthma Son    Colon cancer Neg Hx    Esophageal cancer Neg Hx    Pancreatic cancer Neg Hx    Stomach cancer Neg Hx    Liver disease Neg Hx    Social History   Tobacco Use   Smoking status: Never   Smokeless tobacco: Never  Vaping Use   Vaping Use: Never used  Substance Use Topics   Alcohol use: Yes    Comment: weekends   Drug use: No   Current Outpatient Medications  Medication Sig Dispense Refill   Adalimumab (HUMIRA PEN) 80 MG/0.8ML PNKT Inject 80 mg into the skin every 14 (fourteen) days. 2 each 11   azelastine (ASTELIN) 0.1 % nasal spray      azithromycin (ZITHROMAX) 250 MG tablet 2 tablets day 1,  then 1 tablet days 2-4 6 tablet 0   CALCIUM CITRATE PO Take 1 tablet by mouth 2 (two) times daily.     Cholecalciferol (VITAMIN D-3 PO) Take 1 capsule by mouth at bedtime.     Cyanocobalamin (VITAMIN B-12 PO) Take 1 tablet by mouth daily.     DYMISTA 137-50 MCG/ACT SUSP Place 1 spray into the nose as needed.  6   ezetimibe (ZETIA) 10 MG tablet Take 1 tablet (10 mg total) by mouth daily. 90 tablet 3   fexofenadine (ALLEGRA) 180 MG tablet Take 180 mg by mouth daily.     fluticasone (CUTIVATE) 0.005 % ointment      fluticasone (FLONASE) 50 MCG/ACT nasal spray Place 1-2 sprays into both nostrils daily.     Iron-Folic Acid-Vit A41 (IRON FORMULA PO) Take by mouth.     levocetirizine (XYZAL) 5 MG tablet Take 5 mg by mouth every evening.     montelukast (SINGULAIR) 10 MG tablet Take 10 mg by mouth at  bedtime.     Multiple Vitamin (MULITIVITAMIN WITH MINERALS) TABS Take 1 tablet by mouth 2 (two) times daily.     Omega-3 Fatty Acids (FISH OIL PO) Take 2 capsules by mouth 2 (two) times daily.      rosuvastatin (CRESTOR) 20 MG tablet TAKE 1 TABLET(20 MG) BY MOUTH DAILY 90 tablet 3   No current facility-administered medications for this visit.   No Known Allergies   Review of Systems: All systems reviewed and negative except where noted in HPI.   Lab Results  Component Value Date   WBC 4.1 10/02/2021   HGB 13.6 10/02/2021   HCT 40.5 10/02/2021   MCV 93 10/02/2021   PLT 294 10/02/2021    Lab Results  Component Value Date   CREATININE 0.80 03/18/2022   BUN 14 03/18/2022   NA 139 03/18/2022   K 4.2 03/18/2022   CL 100 03/18/2022   CO2 24 03/18/2022    Lab Results  Component Value Date   ALT 11 03/18/2022   AST 27 10/02/2021   ALKPHOS 69 10/02/2021   BILITOT 0.4 10/02/2021     Physical Exam: BP 132/86   Pulse (!) 58   Ht 5\' 7"  (1.702 m)   Wt 185 lb (83.9 kg)   BMI 28.98 kg/m  Constitutional: Pleasant,well-developed, female in no acute distress. Neurological: Alert and  oriented to person place and time. Psychiatric: Normal mood and affect. Behavior is normal.   ASSESSMENT: 67 y.o. female here for assessment of the following  1. Crohn's disease of both small and large intestine without complication ([place])   2. High risk medication use    Generally has done really well with Humira monotherapy over several years.  She had 1 flare last year with a negative C. difficile test and elevated fecal calprotectin, she had a subtherapeutic Humira level in that setting and her Humira dosing was increased to 80 mg every 2 weeks.  She has responded appropriately to that dosing and feels well.  We discussed her Crohn's disease, long-term risks of this, goals of therapy are to control her symptoms and to reduce her risk for colon cancer.  Given her duration of disease and switch in dosing of Humira I think colonoscopy at this time is reasonable to restage her disease.  We discussed risks and benefits of colonoscopy and anesthesia with her and she wants to proceed.  Otherwise she is due for routine lab work.  Will check CBC, c-Met, QuantiFERON gold, hepatitis B serology, and vitamin D level.  I also would like to proactively check her Humira trough level and antibody level prior to her next dose to ensure adequate response to dose change.  She is willing to do this, further recommendations pending the results and her course.  She is very proactive with getting her immunizations and will continue to do so for seasonal flu shot, etc. while on Humira   PLAN: - continue Humira at present dosing - check trough level and AB level prior to next dose - lab for quantiferon gold, hepatitis B surface antigen, CBC, CMET, vitamin D - schedule for surveillance colonoscopy at the [facility] - f/u 6 months or sooner with issues  Jolly Mango, MD [facility]

## 2022-05-24 ENCOUNTER — Telehealth: Payer: Self-pay | Admitting: *Deleted

## 2022-05-24 NOTE — Telephone Encounter (Signed)
   Patient Name: Connie Porter  DOB: 1956-02-03 MRN: [id number]  Primary Cardiologist: Mertie Moores, MD  Chart reviewed as part of pre-operative protocol coverage. Given past medical history and time since last visit, based on ACC/AHA guidelines, Connie Porter is at acceptable risk for the planned procedure without further cardiovascular testing.   I will route this recommendation to the requesting party via Epic fax function and remove from pre-op pool.  Please call with questions.  Mable Fill, Marissa Nestle, NP 05/24/2022, 3:10 PM

## 2022-05-24 NOTE — Telephone Encounter (Signed)
   Pre-operative Risk Assessment    Patient Name: Connie Porter  DOB: 20-Mar-1956 MRN: [id number]      Request for Surgical Clearance    Procedure:   LEFT TOTAL SHOULDER REPLACEMENT  Date of Surgery:  Clearance TBD                                 Surgeon:  DR. Marchia Bond Surgeon's Group or Practice Name:  [facility] Phone number:  [phone] EXT [phone] ATTN: Derek Jack Fax number:  [phone]   Type of Clearance Requested:   - Medical ; NO MEDICATIONS LISTED AS NEEDING TO BE HELD   Type of Anesthesia:  General    Additional requests/questions:    Jiles Prows   05/24/2022, 3:01 PM

## 2022-05-27 ENCOUNTER — Other Ambulatory Visit: Payer: Self-pay | Admitting: Orthopedic Surgery

## 2022-05-27 DIAGNOSIS — Z01818 Encounter for other preprocedural examination: Secondary | ICD-10-CM

## 2022-05-30 ENCOUNTER — Telehealth: Payer: Self-pay | Admitting: Nurse Practitioner

## 2022-05-30 NOTE — Telephone Encounter (Signed)
Surgical clearance paperwork received from [facility] for left total shoulder replacement.   The patients former PCP is no longer with this practice and I have not met her as of yet. I recommend an in person appointment for examination in order to appropriately assess risks associated with a general anesthetic procedure for this patient.   Please call patient to schedule an appointment so examination, labs (if needed) and medication review can be complete to assess surgical risk.

## 2022-05-31 ENCOUNTER — Other Ambulatory Visit (INDEPENDENT_AMBULATORY_CARE_PROVIDER_SITE_OTHER): Payer: Medicare Other

## 2022-05-31 DIAGNOSIS — Z79899 Other long term (current) drug therapy: Secondary | ICD-10-CM | POA: Diagnosis not present

## 2022-05-31 DIAGNOSIS — K5080 Crohn's disease of both small and large intestine without complications: Secondary | ICD-10-CM

## 2022-05-31 LAB — CBC WITH DIFFERENTIAL/PLATELET
Basophils Absolute: 0 10*3/uL (ref 0.0–0.1)
Basophils Relative: 0.7 % (ref 0.0–3.0)
Eosinophils Absolute: 0 10*3/uL (ref 0.0–0.7)
Eosinophils Relative: 0.6 % (ref 0.0–5.0)
HCT: 41 % (ref 36.0–46.0)
Hemoglobin: 13.5 g/dL (ref 12.0–15.0)
Lymphocytes Relative: 12.8 % (ref 12.0–46.0)
Lymphs Abs: 0.8 10*3/uL (ref 0.7–4.0)
MCHC: 33 g/dL (ref 30.0–36.0)
MCV: 92.1 fl (ref 78.0–100.0)
Monocytes Absolute: 0.4 10*3/uL (ref 0.1–1.0)
Monocytes Relative: 6.7 % (ref 3.0–12.0)
Neutro Abs: 5 10*3/uL (ref 1.4–7.7)
Neutrophils Relative %: 79.2 % — ABNORMAL HIGH (ref 43.0–77.0)
Platelets: 292 10*3/uL (ref 150.0–400.0)
RBC: 4.46 Mil/uL (ref 3.87–5.11)
RDW: 13.4 % (ref 11.5–15.5)
WBC: 6.3 10*3/uL (ref 4.0–10.5)

## 2022-05-31 LAB — COMPREHENSIVE METABOLIC PANEL
ALT: 12 U/L (ref 0–35)
AST: 17 U/L (ref 0–37)
Albumin: 4 g/dL (ref 3.5–5.2)
Alkaline Phosphatase: 56 U/L (ref 39–117)
BUN: 13 mg/dL (ref 6–23)
CO2: 29 mEq/L (ref 19–32)
Calcium: 9.8 mg/dL (ref 8.4–10.5)
Chloride: 104 mEq/L (ref 96–112)
Creatinine, Ser: 0.85 mg/dL (ref 0.40–1.20)
GFR: 70.78 mL/min (ref >60.00)
Glucose, Bld: 90 mg/dL (ref 70–99)
Potassium: 4.1 mEq/L (ref 3.5–5.1)
Sodium: 140 mEq/L (ref 135–145)
Total Bilirubin: 0.5 mg/dL (ref 0.2–1.2)
Total Protein: 7.3 g/dL (ref 6.0–8.3)

## 2022-05-31 LAB — VITAMIN D 25 HYDROXY (VIT D DEFICIENCY, FRACTURES): VITD: 68.93 ng/mL (ref 30.00–100.00)

## 2022-06-03 LAB — QUANTIFERON-TB GOLD PLUS
Mitogen-NIL: 7.69 IU/mL
NIL: 0.11 IU/mL
QuantiFERON-TB Gold Plus: NEGATIVE
TB1-NIL: <0 IU/mL
TB2-NIL: <0 IU/mL

## 2022-06-03 LAB — HEPATITIS B SURFACE ANTIGEN: Hepatitis B Surface Ag: NONREACTIVE

## 2022-06-06 ENCOUNTER — Ambulatory Visit
Admission: RE | Admit: 2022-06-06 | Discharge: 2022-06-06 | Disposition: A | Discharge status: Home / Self Care | Payer: Medicare Other | Source: Ambulatory Visit | Attending: Orthopedic Surgery | Admitting: Orthopedic Surgery

## 2022-06-06 DIAGNOSIS — Z01818 Encounter for other preprocedural examination: Secondary | ICD-10-CM

## 2022-06-07 ENCOUNTER — Ambulatory Visit (INDEPENDENT_AMBULATORY_CARE_PROVIDER_SITE_OTHER): Payer: Medicare Other | Admitting: Nurse Practitioner

## 2022-06-07 ENCOUNTER — Encounter: Payer: Self-pay | Admitting: Nurse Practitioner

## 2022-06-07 VITALS — BP 122/78 | HR 72 | Wt 176.8 lb

## 2022-06-07 DIAGNOSIS — K5080 Crohn's disease of both small and large intestine without complications: Secondary | ICD-10-CM

## 2022-06-07 DIAGNOSIS — I10 Essential (primary) hypertension: Secondary | ICD-10-CM | POA: Diagnosis not present

## 2022-06-07 DIAGNOSIS — G8929 Other chronic pain: Secondary | ICD-10-CM | POA: Insufficient documentation

## 2022-06-07 DIAGNOSIS — E782 Mixed hyperlipidemia: Secondary | ICD-10-CM

## 2022-06-07 DIAGNOSIS — M25512 Pain in left shoulder: Secondary | ICD-10-CM

## 2022-06-07 HISTORY — DX: Other chronic pain: G89.29

## 2022-06-07 NOTE — Assessment & Plan Note (Signed)
Patient is scheduled for left total shoulder replacement surgery with a history of successful partial knee replacement approximately 6-7 years ago. Expected recovery includes six weeks in a sling followed by six weeks of physical therapy. Preoperative evaluation raised no concerns. Labs completed by GI and Cardiology reviewed today with no concerns.  Plan: - Proceed with scheduled surgery. - Follow postoperative recovery protocol as outlined.

## 2022-06-07 NOTE — Assessment & Plan Note (Signed)
Patient is on Crestor, Zetia, and Omega-3 for cholesterol management with recent labs indicating good cholesterol levels. She has yearly follow-ups with a cardiologist. Plan: - Continue Crestor, Zetia, and Omega-3 as prescribed. - Maintain regular cardiologist appointments.

## 2022-06-07 NOTE — Progress Notes (Signed)
[name], DNP, AGNP-c [place]  [address] [place], [place] [address] [phone]  ESTABLISHED PATIENT- Chronic Health and/or Follow-Up Visit  Blood pressure 122/78, pulse 72, weight 176 lb 12.8 oz (80.2 kg).    Connie Porter is a 67 y.o. year old female presenting today for evaluation and management of the following: Connie Porter presents today for a preoperative evaluation in anticipation of her upcoming left total shoulder replacement surgery.  She has a medical history that includes a partial knee replacement approximately six to seven years ago, which she reports having tolerated well. Connie Porter confirms that she is not experiencing any urinary symptoms or increased frequency at this time. She notes her recent immunizations, having received a flu shot and COVID shot on October 6th, and mentions that she had RSV on October 10th.  Connie Porter has a history of Crohn's disease, which is currently in remission. She is scheduled for a colonoscopy on March 15th to monitor her condition. She is currently being treated with Humira for her Crohn's disease and expresses uncertainty about whether she needs to discontinue the medication prior to her surgery.  The patient denies experiencing any issues with swallowing, choking, shortness of breath, chest pain, or dizziness. She reports a recent sinus infection, which has now resolved. Connie Porter also denies any swelling in her feet or ankles, and states she has no history of blood clots.  All ROS negative with exception of what is listed above.   PHYSICAL EXAM Physical Exam Vitals and nursing note reviewed.  Constitutional:      Appearance: Normal appearance. She is normal weight.  HENT:     Head: Normocephalic.     Right Ear: Tympanic membrane normal.     Left Ear: Tympanic membrane normal.     Nose: Nose normal.     Mouth/Throat:     Mouth: Mucous membranes are moist.     Pharynx: Oropharynx is clear.  Eyes:     Extraocular  Movements: Extraocular movements intact.     Pupils: Pupils are equal, round, and reactive to light.  Neck:     Vascular: No carotid bruit.  Cardiovascular:     Rate and Rhythm: Normal rate and regular rhythm.     Pulses: Normal pulses.     Heart sounds: Normal heart sounds.  Pulmonary:     Effort: Pulmonary effort is normal.     Breath sounds: Normal breath sounds.  Abdominal:     General: Abdomen is flat. Bowel sounds are normal. There is no distension.     Palpations: Abdomen is soft.     Tenderness: There is no abdominal tenderness. There is no right CVA tenderness, left CVA tenderness or guarding.  Musculoskeletal:        General: Normal range of motion.     Cervical back: Normal range of motion and neck supple.     Right lower leg: No edema.     Left lower leg: No edema.  Lymphadenopathy:     Cervical: No cervical adenopathy.  Skin:    General: Skin is warm and dry.     Capillary Refill: Capillary refill takes less than 2 seconds.  Neurological:     General: No focal deficit present.     Mental Status: She is alert and oriented to person, place, and time.  Psychiatric:        Mood and Affect: Mood normal.        Behavior: Behavior normal.        Thought Content: Thought  content normal.        Judgment: Judgment normal.     PLAN Problem List Items Addressed This Visit     Crohn's disease ([place])    Patient's Crohn's disease is currently in remission. She is on Humira 80 mg and has a colonoscopy scheduled for March 15th with no gastrointestinal symptoms reported. Plan: - Continue current treatment with Humira. - Proceed with scheduled colonoscopy. - Patient to consult with surgeon or rheumatologist about Humira dosing pre- and - post-surgery.      Primary hypertension    Blood pressure well controlled in the office today. She has recently had a significant weight loss, which has helped with her blood pressure management. She is not currently on any medication for  control. She is followed closely with cardiology. Plan: - Continue to follow-up with cardiology routinely - Monitor your blood pressure at home and report any levels consistently above 130/85      Mixed hyperlipidemia    Patient is on Crestor, Zetia, and Omega-3 for cholesterol management with recent labs indicating good cholesterol levels. She has yearly follow-ups with a cardiologist. Plan: - Continue Crestor, Zetia, and Omega-3 as prescribed. - Maintain regular cardiologist appointments.      Chronic left shoulder pain - Primary    Patient is scheduled for left total shoulder replacement surgery with a history of successful partial knee replacement approximately 6-7 years ago. Expected recovery includes six weeks in a sling followed by six weeks of physical therapy. Preoperative evaluation raised no concerns. Labs completed by GI and Cardiology reviewed today with no concerns.  Plan: - Proceed with scheduled surgery. - Follow postoperative recovery protocol as outlined.       Return for after 10/03/2022 for AWV.   [name], DNP, AGNP-c 06/07/2022  8:14 AM

## 2022-06-07 NOTE — Assessment & Plan Note (Signed)
Blood pressure well controlled in the office today. She has recently had a significant weight loss, which has helped with her blood pressure management. She is not currently on any medication for control. She is followed closely with cardiology. Plan: - Continue to follow-up with cardiology routinely - Monitor your blood pressure at home and report any levels consistently above 130/85

## 2022-06-07 NOTE — Assessment & Plan Note (Addendum)
Patient's Crohn's disease is currently in remission. She is on Humira 80 mg and has a colonoscopy scheduled for March 15th with no gastrointestinal symptoms reported. Plan: - Continue current treatment with Humira. - Proceed with scheduled colonoscopy. - Patient to consult with surgeon or rheumatologist about Humira dosing pre- and - post-surgery.

## 2022-06-07 NOTE — Patient Instructions (Signed)
I have signed the release form for the surgery. I wish you best of luck and speedy recovery!!

## 2022-06-10 LAB — SERIAL MONITORING

## 2022-06-11 LAB — ADALIMUMAB+AB (SERIAL MONITOR)
Adalimumab Drug Level: 15 ug/mL
Anti-Adalimumab Antibody: <25 ng/mL

## 2022-06-22 IMAGING — CT CT HEART MORP W/ CTA COR W/ SCORE W/ CA W/CM &/OR W/O CM
4 of 7 series · 8 of 20 positions shown, 9 images · IV contrast (omnipaque)
Comparison: None.

Addendum:
HISTORY: Chest pain, nonspecific

EXAM:
Cardiac/Coronary  CT
TECHNIQUE: The patient was scanned on a Siemens Force scanner.
PROTOCOL: A 120 kV prospective scan was triggered in the descending thoracic
aorta at 111 HU's. Axial non-contrast 3 mm slices were carried out
through the heart. The data set was analyzed on a dedicated work
station and scored using the Agatston method. Gantry rotation speed
was 250 msecs and collimation was .6 mm. Beta blockade and 0.8 mg of
sl NTG was given. The 3D data set was reconstructed in 5% intervals
of the 35-75 % of the R-R cycle. Systolic and diastolic phases were
analyzed on a dedicated work station using MPR, MIP and VRT modes.
The patient received 100mL OMNIPAQUE IOHEXOL 350 MG/ML SOLN
contrast.

[Series 6: best diast 69 % · axial · 0.39mm/px · z∈[+1333,+1377]mm · 2 of 333 slices shown, 3 images]
[im 111/333  vessel]
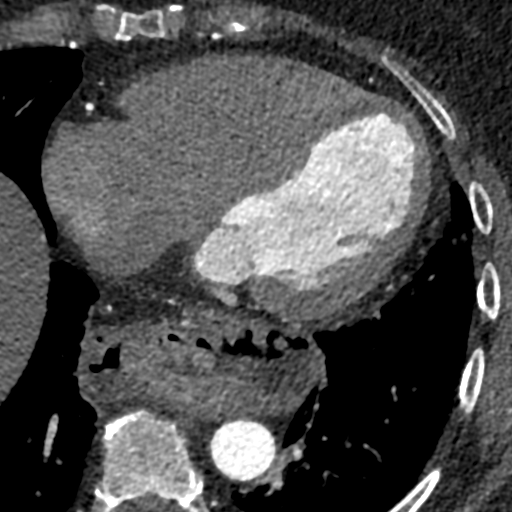
[im 111/333  lung]
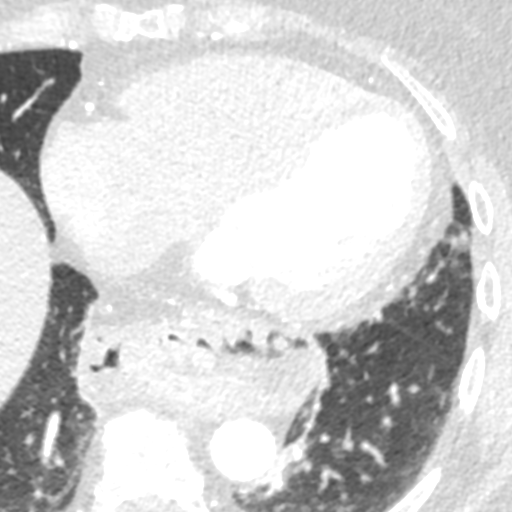
[im 222/333  vessel]
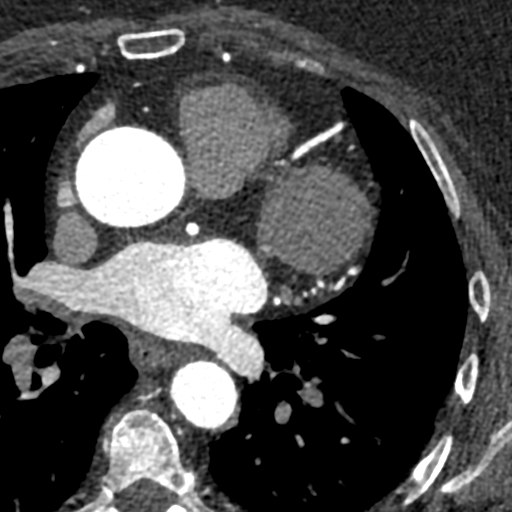

[Series 7: best syst 39 % · axial · 0.39mm/px · z∈[+1333,+1377]mm · 2 of 333 slices shown]
[im 111/333  vessel]
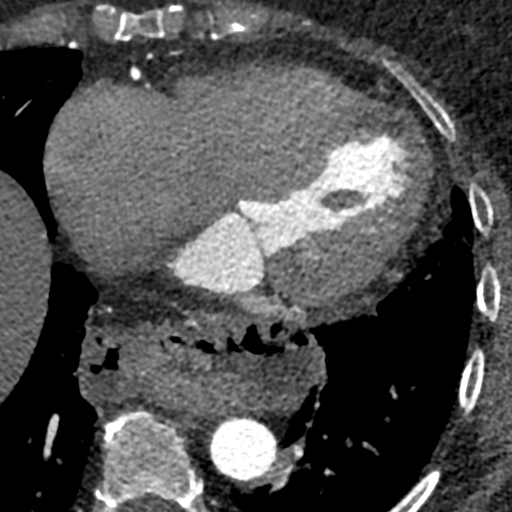
[im 222/333  vessel]
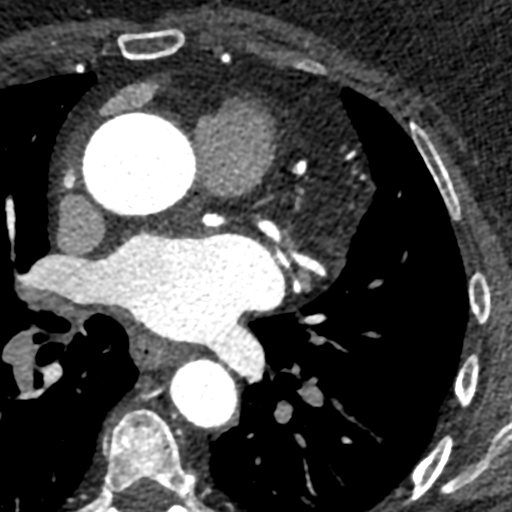

[Series 8: ts diast sharp 69 % · axial · 0.39mm/px · z∈[+1333,+1377]mm · 2 of 333 slices shown]
[im 111/333  lung]
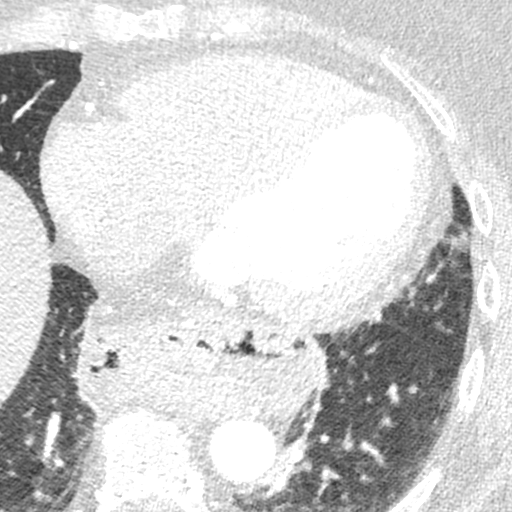
[im 222/333  lung]
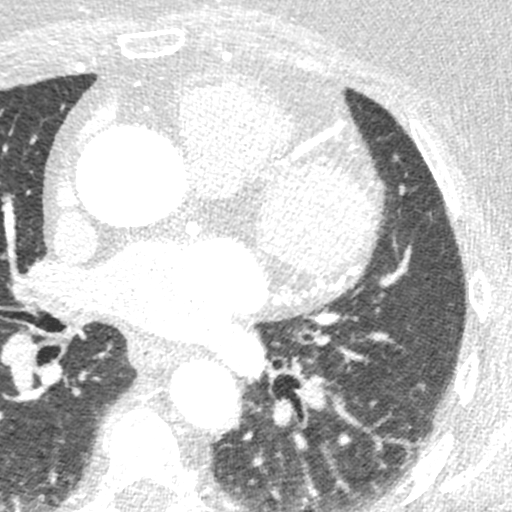

[Series 9: ts syst sharp 39 % · axial · 0.39mm/px · z∈[+1333,+1377]mm · 2 of 333 slices shown]
[im 111/333  lung]
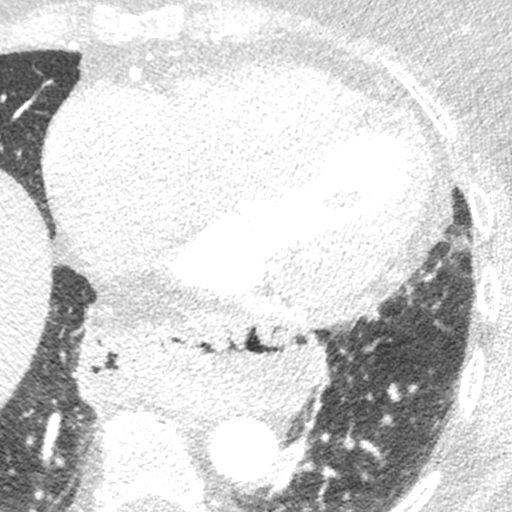
[im 222/333  lung]
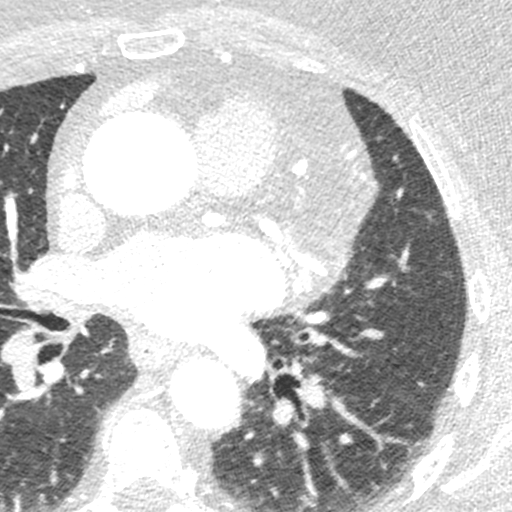

[8 of 20 positions shown; findings below may reference images not displayed]

FINDINGS: Image quality: Good

Noise artifact is: Limited

Coronary calcium score is 5, which places the patient in the 54th
percentile for age and sex matched control.

Coronary arteries: Normal coronary origins.  Right dominance.

Right Coronary Artery: Minimal mixed atherosclerotic plaque in the
mid RCA, <25% stenosis.

Left Main Coronary Artery: Minimal mixed atherosclerotic plaque in
the distal left main, <25% stenosis.

Left Anterior Descending Coronary Artery: Minimal mixed
atherosclerotic plaque in the ostial LAD, <25% stenosis.

Left Circumflex Artery: No detectable plaque or stenosis.

Aorta: Borderline dilation, 39 mm at the mid ascending aorta (level
of the PA bifurcation) measured double oblique. Trivial
calcifications. No dissection.

Aortic Valve: No calcifications.

Other findings:

Normal pulmonary vein drainage into the left atrium.

Normal left atrial appendage without thrombus.

Mild dilation of main pulmonary artery, 32 mm
IMPRESSION: 1. Minimal CAD, CADRADS = 1.

2. Coronary calcium score is 5, which places the patient in the 54th
percentile for age and sex matched control.

3. Normal coronary origins with right dominance.

4. Borderline ascending aorta dilation, 39 mm at mid ascending
aorta.

EXAM:
OVER-READ INTERPRETATION  CT CHEST

The following report is an over-read performed by radiologist Dr.
does not include interpretation of cardiac or coronary anatomy or
pathology. The coronary calcium score/coronary CTA interpretation by
the cardiologist is attached.
FINDINGS: The visualized portion of the lung fields show no suspicious
nodules, masses, or infiltrates. No pleural fluid seen.

A moderate to large hiatal hernia is seen. The visualized portions
of the mediastinum and chest wall are otherwise unremarkable.
IMPRESSION: Moderate to large hiatal hernia. No other significant abnormality
identified.

These results will be called to the ordering clinician or
representative by the Radiologist Assistant, and communication
documented in the PACS or [REDACTED].

*** End of Addendum ***
FINDINGS: Image quality: Good

Noise artifact is: Limited

Coronary calcium score is 5, which places the patient in the 54th
percentile for age and sex matched control.

Coronary arteries: Normal coronary origins.  Right dominance.

Right Coronary Artery: Minimal mixed atherosclerotic plaque in the
mid RCA, <25% stenosis.

Left Main Coronary Artery: Minimal mixed atherosclerotic plaque in
the distal left main, <25% stenosis.

Left Anterior Descending Coronary Artery: Minimal mixed
atherosclerotic plaque in the ostial LAD, <25% stenosis.

Left Circumflex Artery: No detectable plaque or stenosis.

Aorta: Borderline dilation, 39 mm at the mid ascending aorta (level
of the PA bifurcation) measured double oblique. Trivial
calcifications. No dissection.

Aortic Valve: No calcifications.

Other findings:

Normal pulmonary vein drainage into the left atrium.

Normal left atrial appendage without thrombus.

Mild dilation of main pulmonary artery, 32 mm
IMPRESSION: 1. Minimal CAD, CADRADS = 1.

2. Coronary calcium score is 5, which places the patient in the 54th
percentile for age and sex matched control.

3. Normal coronary origins with right dominance.

4. Borderline ascending aorta dilation, 39 mm at mid ascending
aorta.

## 2022-06-28 ENCOUNTER — Encounter: Payer: Self-pay | Admitting: Gastroenterology

## 2022-06-28 ENCOUNTER — Ambulatory Visit (AMBULATORY_SURGERY_CENTER): Payer: Medicare Other | Admitting: Gastroenterology

## 2022-06-28 VITALS — BP 149/80 | HR 56 | Temp 96.8°F | Resp 13 | Ht 67.0 in | Wt 185.0 lb

## 2022-06-28 DIAGNOSIS — K5080 Crohn's disease of both small and large intestine without complications: Secondary | ICD-10-CM | POA: Diagnosis not present

## 2022-06-28 DIAGNOSIS — K6389 Other specified diseases of intestine: Secondary | ICD-10-CM | POA: Diagnosis not present

## 2022-06-28 DIAGNOSIS — D120 Benign neoplasm of cecum: Secondary | ICD-10-CM

## 2022-06-28 DIAGNOSIS — K635 Polyp of colon: Secondary | ICD-10-CM | POA: Diagnosis not present

## 2022-06-28 MED ORDER — SODIUM CHLORIDE 0.9 % IV SOLN: 500.0000 mL | Freq: Once | INTRAVENOUS | Status: DC

## 2022-06-28 NOTE — Progress Notes (Signed)
Sedate, gd SR, tolerated procedure well, VSS, report to RN 

## 2022-06-28 NOTE — Op Note (Signed)
[place] Patient Name: Connie Porter Procedure Date: 06/28/2022 3:29 PM MRN: [id number] Endoscopist: Remo Lipps P. Havery Moros , MD, [id number] Age: 67 Referring MD:  Date of Birth: 05/01/1955 Gender: Female Account #: [id number] Procedure:                Colonoscopy Indications:              Disease activity assessment of Crohn's disease of                            the small bowel and colon - on Hunmira 80mg  every 2                            weeks with therapeutic levels, feeling well - last                            colonoscopy 2014 Medicines:                Monitored Anesthesia Care Procedure:                Pre-Anesthesia Assessment:                           - Prior to the procedure, a History and Physical                            was performed, and patient medications and                            allergies were reviewed. The patient's tolerance of                            previous anesthesia was also reviewed. The risks                            and benefits of the procedure and the sedation                            options and risks were discussed with the patient.                            All questions were answered, and informed consent                            was obtained. Prior Anticoagulants: The patient has                            taken no anticoagulant or antiplatelet agents. ASA                            Grade Assessment: II - A patient with mild systemic                            disease. After reviewing the risks and benefits,  the patient was deemed in satisfactory condition to                            undergo the procedure.                           After obtaining informed consent, the colonoscope                            was passed under direct vision. Throughout the                            procedure, the patient's blood pressure, pulse, and                            oxygen saturations were monitored  continuously. The                            Olympus PCF-H190DL [id number]) Colonoscope was                            introduced through the anus and advanced to the the                            terminal ileum, with identification of the                            appendiceal orifice and IC valve. The colonoscopy                            was performed without difficulty. The patient                            tolerated the procedure well. The quality of the                            bowel preparation was adequate. The terminal ileum,                            ileocecal valve, appendiceal orifice, and rectum                            were photographed. Scope In: 3:38:44 PM Scope Out: 4:09:47 PM Scope Withdrawal Time: 0 hours 26 minutes 22 seconds  Total Procedure Duration: 0 hours 31 minutes 3 seconds  Findings:                 The perianal and digital rectal examinations were                            normal.                           The terminal ileum appeared normal.  A roughly 20 mm polyp was found in the cecum. The                            polyp was sessile and located just behind the IC                            valve, difficult to visualize en bloc. The polyp                            was removed with a piecemeal technique using a cold                            snare. Resection and retrieval were complete.                           Multiple medium-mouthed diverticula were found in                            the sigmoid colon.                           Anal papilla(e) were hypertrophied.                           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                           The colon was quite tortous. The exam was otherwise                            without abnormality.                           Biopsies were taken with a cold forceps for                            histology in the right, left,  and transverse colon. Complications:            No immediate complications. Estimated blood loss:                            Minimal. Estimated Blood Loss:     Estimated blood loss was minimal. Impression:               - The examined portion of the ileum was normal.                           - One 20 mm polyp in the cecum, removed piecemeal                            using a cold snare. Resected and retrieved.                           -  Diverticulosis in the sigmoid colon.                           - Anal papilla(e) were hypertrophied.                           - Melanosis coli                           - Tortous colon                           - The examination was otherwise normal.                           - Biopsies were taken with a cold forceps for                            histology.                           - The GI Genius (intelligent endoscopy module),                            computer-aided polyp detection system powered by AI                            was utilized to detect colorectal polyps through                            enhanced visualization during colonoscopy.                           Overall, Crohn's disease is in remission on current                            regimen. Recommendation:           - Patient has a contact number available for                            emergencies. The signs and symptoms of potential                            delayed complications were discussed with the                            patient. Return to normal activities tomorrow.                            Written discharge instructions were provided to the                            patient.                           - Resume previous diet.                           -  Continue present medications.                           - Await pathology results. Remo Lipps P. Marshea Wisher, MD 06/28/2022 4:16:28 PM This report has been signed electronically.

## 2022-06-28 NOTE — Progress Notes (Signed)
Called to room to assist during endoscopic procedure.  Patient ID and intended procedure confirmed with present staff. Received instructions for my participation in the procedure from the performing physician.  

## 2022-06-28 NOTE — Progress Notes (Signed)
[facility] History and Physical   Primary Care Physician:  Early, Coralee Pesa, NP   Reason for Procedure:   Crohn's diseas  Plan:    colonoscopy     HPI: Connie Porter is a 67 y.o. female  here for colonoscopy surveillance - history of Crohn's disease - last exam 2014. On  Humira 80mg  q 2 weeks and feeling well.   Patient denies any bowel symptoms at this time. No family history of colon cancer known. Otherwise feels well without any cardiopulmonary symptoms.   I have discussed risks / benefits of anesthesia and endoscopic procedure with [name] and they wish to proceed with the exams as outlined today.    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;  Service: Orthopedics;  Laterality: Right;   TONSILLECTOMY AND ADENOIDECTOMY     WISDOM TOOTH EXTRACTION      Prior to Admission medications   Medication Sig Start Date End Date Taking? Authorizing Provider  CALCIUM CITRATE PO Take 1 tablet by mouth 2 (two) times daily.   Yes [provider]  Cholecalciferol (VITAMIN D-3 PO) Take 1 capsule by mouth at bedtime.   Yes [provider]  Cyanocobalamin (VITAMIN B-12 PO) Take 1 tablet by mouth daily.   Yes [provider]  ezetimibe (ZETIA) 10 MG tablet Take 1 tablet (10 mg total) by mouth daily. 03/18/22  03/13/23 Yes Nahser, Wonda Cheng, MD  fexofenadine (ALLEGRA) 180 MG tablet Take 180 mg by mouth daily.   Yes [provider]  Iron-Folic Acid-Vit 123456 (IRON FORMULA PO) Take by mouth.   Yes [provider]  levocetirizine (XYZAL) 5 MG tablet Take 5 mg by mouth every evening.   Yes [provider]  montelukast (SINGULAIR) 10 MG tablet Take 10 mg by mouth at bedtime.   Yes [provider]  Multiple Vitamin (MULITIVITAMIN WITH MINERALS) TABS Take 1 tablet by mouth 2 (two) times daily.   Yes [provider]  Omega-3 Fatty Acids (FISH OIL PO) Take 2 capsules by mouth 2 (two) times daily.    Yes [provider]  rosuvastatin (CRESTOR) 20 MG tablet TAKE 1 TABLET(20 MG) BY MOUTH DAILY 03/18/22  Yes Nahser, Wonda Cheng, MD  Adalimumab (HUMIRA PEN) 80 MG/0.8ML PNKT Inject 80 mg into the skin every 14 (fourteen) days. 02/08/22   Yetta Flock, MD  azelastine (ASTELIN) 0.1 % nasal spray  07/21/19   [provider]  DYMISTA 137-50 MCG/ACT SUSP Place 1 spray into the nose as needed. 11/13/16   [provider]  fluticasone (CUTIVATE) 0.005 % ointment  02/21/20   [provider]  fluticasone (FLONASE) 50 MCG/ACT nasal spray Place 1-2 sprays into both nostrils daily.    [provider]    Current  Outpatient Medications  Medication Sig Dispense Refill   CALCIUM CITRATE PO Take 1 tablet by mouth 2 (two) times daily.     Cholecalciferol (VITAMIN D-3 PO) Take 1 capsule by mouth at bedtime.     Cyanocobalamin (VITAMIN B-12 PO) Take 1 tablet by mouth daily.     ezetimibe (ZETIA) 10 MG tablet Take 1 tablet (10 mg total) by mouth daily. 90 tablet 3   fexofenadine (ALLEGRA) 180 MG tablet Take 180 mg by mouth daily.     Iron-Folic Acid-Vit 123456 (IRON FORMULA PO) Take by mouth.     levocetirizine (XYZAL) 5 MG tablet Take 5 mg by mouth every evening.     montelukast (SINGULAIR) 10 MG tablet Take 10 mg by mouth at bedtime.     Multiple  Vitamin (MULITIVITAMIN WITH MINERALS) TABS Take 1 tablet by mouth 2 (two) times daily.     Omega-3 Fatty Acids (FISH OIL PO) Take 2 capsules by mouth 2 (two) times daily.      rosuvastatin (CRESTOR) 20 MG tablet TAKE 1 TABLET(20 MG) BY MOUTH DAILY 90 tablet 3   Adalimumab (HUMIRA PEN) 80 MG/0.8ML PNKT Inject 80 mg into the skin every 14 (fourteen) days. 2 each 11   azelastine (ASTELIN) 0.1 % nasal spray      DYMISTA 137-50 MCG/ACT SUSP Place 1 spray into the nose as needed.  6   fluticasone (CUTIVATE) 0.005 % ointment      fluticasone (FLONASE) 50 MCG/ACT nasal spray Place 1-2 sprays into both nostrils daily.     Current Facility-Administered Medications  Medication Dose Route Frequency Provider Last Rate Last Admin   0.9 %  sodium chloride infusion  500 mL Intravenous Once Davonte Siebenaler, Carlota Raspberry, MD        Allergies as of 06/28/2022   (No Known Allergies)    Family History  Problem Relation Age of Onset   Arthritis Mother    Dementia Mother    Heart disease Father 85   Hyperlipidemia Father    Hypertension Father    Heart attack Father    Heart disease Brother 78       MI   Parkinson's disease Brother    Stroke Maternal Grandfather    Asthma Son    Colon cancer Neg Hx    Esophageal cancer Neg Hx    Pancreatic cancer Neg Hx    Stomach cancer Neg Hx    Liver disease Neg Hx     Social History   Socioeconomic History   Marital status: Married    Spouse name: Not on file   Number of children: 3   Years of education: Not on file   Highest education level: Not on file  Occupational History   Occupation: Retired Pharmacist, hospital  Tobacco Use   Smoking status: Never   Smokeless tobacco: Never  Vaping Use   Vaping Use: Never used  Substance and Sexual Activity   Alcohol use: Yes    Comment: weekends   Drug use: No   Sexual activity: Yes    Birth control/protection: Post-menopausal  Other Topics Concern   Not on file  Social History Narrative   Not on file   Social  Determinants of Health   Financial Resource Strain: Not on file  Food Insecurity: Not on file  Transportation Needs: Not on file  Physical Activity: Not on file  Stress: Not on file  Social Connections: Not on file  Intimate Partner Violence: Not on file    Review of Systems: All  other review of systems negative except as mentioned in the HPI.  Physical Exam: Vital signs BP 136/87   Pulse 71   Temp (!) 96.8 F (36 C) (Temporal)   Ht 5\' 7"  (123XX123 m)   Wt 185 lb (83.9 kg)   SpO2 100%   BMI 28.98 kg/m   General:   Alert,  Well-developed, pleasant and cooperative in NAD Lungs:  Clear throughout to auscultation.   Heart:  Regular rate and rhythm Abdomen:  Soft, nontender and nondistended.   Neuro/Psych:  Alert and cooperative. Normal mood and affect. A and O x 3  Jolly Mango, MD [facility]

## 2022-06-28 NOTE — Progress Notes (Signed)
VS completed by EC.   Pt's states no medical or surgical changes since previsit or office visit.  

## 2022-06-28 NOTE — Patient Instructions (Signed)
Handouts Provided:  Polyps and Diverticulosis  YOU HAD AN ENDOSCOPIC PROCEDURE TODAY AT THE [facility]:   Refer to the procedure report that was given to you for any specific questions about what was found during the examination.  If the procedure report does not answer your questions, please call your gastroenterologist to clarify.  If you requested that your care partner not be given the details of your procedure findings, then the procedure report has been included in a sealed envelope for you to review at your convenience later.  YOU SHOULD EXPECT: Some feelings of bloating in the abdomen. Passage of more gas than usual.  Walking can help get rid of the air that was put into your GI tract during the procedure and reduce the bloating. If you had a lower endoscopy (such as a colonoscopy or flexible sigmoidoscopy) you may notice spotting of blood in your stool or on the toilet paper. If you underwent a bowel prep for your procedure, you may not have a normal bowel movement for a few days.  Please Note:  You might notice some irritation and congestion in your nose or some drainage.  This is from the oxygen used during your procedure.  There is no need for concern and it should clear up in a day or so.  SYMPTOMS TO REPORT IMMEDIATELY:  Following lower endoscopy (colonoscopy or flexible sigmoidoscopy):  Excessive amounts of blood in the stool  Significant tenderness or worsening of abdominal pains  Swelling of the abdomen that is new, acute  Fever of 100F or higher  For urgent or emergent issues, a gastroenterologist can be reached at any hour by calling [phone]. Do not use MyChart messaging for urgent concerns.    DIET:  We do recommend a small meal at first, but then you may proceed to your regular diet.  Drink plenty of fluids but you should avoid alcoholic beverages for 24 hours.  ACTIVITY:  You should plan to take it easy for the rest of today and you should NOT DRIVE  or use heavy machinery until tomorrow (because of the sedation medicines used during the test).    FOLLOW UP: Our staff will call the number listed on your records the next business day following your procedure.  We will call around 7:15- 8:00 am to check on you and address any questions or concerns that you may have regarding the information given to you following your procedure. If we do not reach you, we will leave a message.     If any biopsies were taken you will be contacted by phone or by letter within the next 1-3 weeks.  Please call us at [phone] if you have not heard about the biopsies in 3 weeks.    SIGNATURES/CONFIDENTIALITY: You and/or your care partner have signed paperwork which will be entered into your electronic medical record.  These signatures attest to the fact that that the information above on your After Visit Summary has been reviewed and is understood.  Full responsibility of the confidentiality of this discharge information lies with you and/or your care-partner.  

## 2022-07-01 ENCOUNTER — Telehealth: Payer: Self-pay

## 2022-07-01 NOTE — Telephone Encounter (Signed)
  Follow up Call-     06/28/2022    2:28 PM  Call back number  Post procedure Call Back phone  # [phone]  Permission to leave phone message Yes     Patient questions:  Do you have a fever, pain , or abdominal swelling? No. Pain Score  0 *  Have you tolerated food without any problems? Yes.    Have you been able to return to your normal activities? Yes.    Do you have any questions about your discharge instructions: Diet   No. Medications  No. Follow up visit  No.  Do you have questions or concerns about your Care? No.  Actions: * If pain score is 4 or above: No action needed, pain <4.

## 2022-07-25 ENCOUNTER — Telehealth: Payer: Self-pay | Admitting: Pharmacy Technician

## 2022-07-25 NOTE — Telephone Encounter (Signed)
Patient Advocate Encounter  Received notification from [employer] that prior authorization for HUMIRA is required.   PA submitted on 4.11.24 Key [id number] Status is pending

## 2022-08-30 ENCOUNTER — Other Ambulatory Visit (HOSPITAL_COMMUNITY): Payer: Self-pay

## 2022-08-30 NOTE — Telephone Encounter (Signed)
KEY: [id number]

## 2022-10-04 ENCOUNTER — Ambulatory Visit: Payer: BC Managed Care – PPO | Admitting: Physician Assistant

## 2022-10-08 ENCOUNTER — Encounter: Payer: Self-pay | Admitting: Nurse Practitioner

## 2022-10-08 ENCOUNTER — Ambulatory Visit (INDEPENDENT_AMBULATORY_CARE_PROVIDER_SITE_OTHER): Payer: 59 | Admitting: Nurse Practitioner

## 2022-10-08 VITALS — BP 124/78 | HR 68 | Ht 66.25 in | Wt 186.4 lb

## 2022-10-08 DIAGNOSIS — Z8349 Family history of other endocrine, nutritional and metabolic diseases: Secondary | ICD-10-CM

## 2022-10-08 DIAGNOSIS — Z0000 Encounter for general adult medical examination without abnormal findings: Secondary | ICD-10-CM | POA: Diagnosis not present

## 2022-10-08 DIAGNOSIS — Z862 Personal history of diseases of the blood and blood-forming organs and certain disorders involving the immune mechanism: Secondary | ICD-10-CM

## 2022-10-08 DIAGNOSIS — I10 Essential (primary) hypertension: Secondary | ICD-10-CM

## 2022-10-08 DIAGNOSIS — K5080 Crohn's disease of both small and large intestine without complications: Secondary | ICD-10-CM | POA: Diagnosis not present

## 2022-10-08 DIAGNOSIS — E782 Mixed hyperlipidemia: Secondary | ICD-10-CM

## 2022-10-08 DIAGNOSIS — E559 Vitamin D deficiency, unspecified: Secondary | ICD-10-CM

## 2022-10-08 NOTE — Progress Notes (Unsigned)
Primary Care & Sports Medicine [facility] at [facility] [address]  [address] [place], [place]  [address] [phone]   MEDICARE [place] Malling VISIT  11/13/2022  Subjective:  Connie Porter is a 67 y.o. female patient of Vallory Oetken, Sung Amabile, NP who had a Medicare Annual Wellness Visit today. Saray is Working part time and lives with their spouse. she has 3 children. she reports that she is socially active and does interact with friends/family regularly. she is markedly physically active and enjoys walking daily.  Patient Care Team: Quanisha Drewry, Sung Amabile, NP as PCP - General (Nurse Practitioner) Nahser, Deloris Ping, MD as PCP - Cardiology (Cardiology) Rachael Fee, MD as Attending Physician (Gastroenterology) [facility] (Obstetrics and Gynecology) [facility] on [facility], Dr. Elige Radon (levocitirizine)     10/08/2022    9:16 AM 10/02/2021    9:25 AM 10/07/2014    9:53 AM 09/26/2014    2:50 PM 05/02/2014    2:00 PM 09/24/2013   10:13 AM  Advanced Directives  Does Patient Have a Medical Advance Directive? No No No No No Patient does not have advance directive  Would patient like information on creating a medical advance directive? No - Patient declined Yes (ED - Information included in AVS) No - patient declined information No - patient declined information No - patient declined information     Hospital Utilization Over the Past 12 Months: # of hospitalizations or ER visits: 0 # of surgeries: 1  Review of Systems    Patient reports that her overall health is better when compared to last year.  Review of Systems: Negative  All other systems negative.  Pain Assessment   0/10  Physical Exam Vitals and nursing note reviewed.  Constitutional:      Appearance: Normal appearance.  HENT:     Head: Normocephalic.     Right Ear: Tympanic membrane normal.     Left Ear: Tympanic membrane normal.     Nose: Nose normal.      Mouth/Throat:     Mouth: Mucous membranes are moist.     Pharynx: Oropharynx is clear.  Eyes:     General: No scleral icterus.    Conjunctiva/sclera: Conjunctivae normal.  Neck:     Vascular: No carotid bruit.  Cardiovascular:     Rate and Rhythm: Normal rate and regular rhythm.     Pulses: Normal pulses.     Heart sounds: Normal heart sounds.  Pulmonary:     Effort: Pulmonary effort is normal.     Breath sounds: Normal breath sounds.  Abdominal:     General: Bowel sounds are normal. There is no distension.     Palpations: Abdomen is soft.     Tenderness: There is no abdominal tenderness. There is no right CVA tenderness, left CVA tenderness or guarding.  Musculoskeletal:        General: Normal range of motion.     Cervical back: Normal range of motion.  Skin:    General: Skin is warm and dry.     Capillary Refill: Capillary refill takes less than 2 seconds.  Neurological:     General: No focal deficit present.     Mental Status: She is alert and oriented to person, place, and time.  Psychiatric:        Mood and Affect: Mood normal.      Current Medications & Allergies (verified) Allergies as of 10/08/2022   No Known Allergies  Medication List        Accurate as of October 08, 2022 11:59 PM. If you have any questions, ask your nurse or doctor.          azelastine 0.1 % nasal spray Commonly known as: ASTELIN   CALCIUM CITRATE PO Take 1 tablet by mouth 2 (two) times daily.   Dymista 137-50 MCG/ACT Susp Generic drug: Azelastine-Fluticasone Place 1 spray into the nose as needed.   ezetimibe 10 MG tablet Commonly known as: ZETIA Take 1 tablet (10 mg total) by mouth daily.   fexofenadine 180 MG tablet Commonly known as: ALLEGRA Take 180 mg by mouth daily.   FISH OIL PO Take 2 capsules by mouth 2 (two) times daily.   fluticasone 0.005 % ointment Commonly known as: CUTIVATE   fluticasone 50 MCG/ACT nasal spray Commonly known as: FLONASE Place  1-2 sprays into both nostrils daily.   Humira (2 Pen) 80 MG/0.8ML pen Generic drug: adalimumab Inject 80 mg into the skin every 14 (fourteen) days.   IRON FORMULA PO Take by mouth.   levocetirizine 5 MG tablet Commonly known as: XYZAL Take 5 mg by mouth every evening.   montelukast 10 MG tablet Commonly known as: SINGULAIR Take 10 mg by mouth at bedtime.   multivitamin with minerals Tabs tablet Take 1 tablet by mouth 2 (two) times daily.   rosuvastatin 20 MG tablet Commonly known as: CRESTOR TAKE 1 TABLET(20 MG) BY MOUTH DAILY   VITAMIN B-12 PO Take 1 tablet by mouth daily.   VITAMIN D-3 PO Take 1 capsule by mouth at bedtime.        History (reviewed): Past Medical History:  Diagnosis Date   Chronic left shoulder pain 06/07/2022   Crohn's disease (HCC)    Dental crowns present    Encounter for annual physical exam 11/13/2022   GERD (gastroesophageal reflux disease)    High cholesterol    History of hiatal hernia    HTN (hypertension) 04/27/2018   Hyperlipidemia 01/13/2014   Osteoarthritis of knee, unilateral 09/2014   right    Patellofemoral arthritis of right knee 10/07/2014   Seasonal allergies    Past Surgical History:  Procedure Laterality Date   APPENDECTOMY     CESAREAN SECTION     x 3   CHOLECYSTECTOMY     COLONOSCOPY     ESOPHAGOGASTRODUODENOSCOPY (EGD) WITH ESOPHAGEAL DILATION     x 2   JOINT REPLACEMENT  07/11/2022   Shoulder replacement   PATELLA-FEMORAL ARTHROPLASTY Right 10/07/2014   Procedure: RIGHT PATELLA-FEMORAL ARTHROPLASTY;  Surgeon: Teryl Lucy, MD;  Location: [facility];  Service: Orthopedics;  Laterality: Right;   TONSILLECTOMY AND ADENOIDECTOMY     TUBAL LIGATION  O8/13/1991   WISDOM TOOTH EXTRACTION     Family History  Problem Relation Age of Onset   Arthritis Mother    Dementia Mother    Heart disease Father 1   Hyperlipidemia Father    Hypertension Father    Heart attack Father    Heart disease  Brother 13       MI   Parkinson's disease Brother    Stroke Maternal Grandfather    Asthma Son    Colon cancer Neg Hx    Esophageal cancer Neg Hx    Pancreatic cancer Neg Hx    Stomach cancer Neg Hx    Liver disease Neg Hx    Social History   Socioeconomic History   Marital status: Married    Spouse name: Not  on file   Number of children: 3   Years of education: Not on file   Highest education level: Not on file  Occupational History   Occupation: Retired Runner, broadcasting/film/video  Tobacco Use   Smoking status: Never   Smokeless tobacco: Never  Vaping Use   Vaping status: Never Used  Substance and Sexual Activity   Alcohol use: Yes    Alcohol/week: 4.0 standard drinks of alcohol    Types: 4 Glasses of wine per week    Comment: weekends   Drug use: No   Sexual activity: Yes    Birth control/protection: Post-menopausal  Other Topics Concern   Not on file  Social History Narrative   Not on file   Social Determinants of Health   Financial Resource Strain: Low Risk  (10/08/2022)   Overall Financial Resource Strain (CARDIA)    Difficulty of Paying Living Expenses: Not very hard  Food Insecurity: No Food Insecurity (10/08/2022)   Hunger Vital Sign    Worried About Running Out of Food in the Last Year: Never true    Ran Out of Food in the Last Year: Never true  Transportation Needs: No Transportation Needs (10/08/2022)   PRAPARE - Administrator, Civil Service (Medical): No    Lack of Transportation (Non-Medical): No  Physical Activity: Not on file  Stress: No Stress Concern Present (10/08/2022)   [employer] of Occupational Health - Occupational Stress Questionnaire    Feeling of Stress : Only a little  Social Connections: Unknown (10/08/2022)   Social Connection and Isolation Panel [NHANES]    Frequency of Communication with Friends and Family: More than three times a week    Frequency of Social Gatherings with Friends and Family: Three times a week    Attends  Religious Services: Patient declined    Active Member of Clubs or Organizations: No    Attends Banker Meetings: Never    Marital Status: Married    Activities of Daily Living    10/08/2022    9:17 AM  In your present state of health, do you have any difficulty performing the following activities:  Hearing? 0  Vision? 1  Comment see's eye doctor  Difficulty concentrating or making decisions? 0  Walking or climbing stairs? 0  Dressing or bathing? 0  Doing errands, shopping? 0  Preparing Food and eating ? N  Using the Toilet? N  In the past six months, have you accidently leaked urine? N  Do you have problems with loss of bowel control? N  Managing your Medications? N  Managing your Finances? N  Housekeeping or managing your Housekeeping? N    Patient Education/Literacy  No problems with reading  Exercise  Daily exercise  walking 4 miles every day  Diet Patient reports consuming 3 meals a day and ocassional snack(s) a day Patient reports that her primary diet is:  low carb and high protein Patient reports that she does have regular access to food.   Depression Screen    10/08/2022    9:15 AM 06/07/2022    8:12 AM 10/02/2021    9:30 AM 06/29/2021    9:20 AM 09/28/2020    9:29 AM 03/19/2019    9:32 AM 10/23/2017    9:36 AM  PHQ 2/9 Scores  PHQ - 2 Score 0 0 0 0 0 0 0     Fall Risk    10/08/2022    9:15 AM 06/07/2022    8:11 AM 10/02/2021  9:26 AM 06/29/2021    9:20 AM 09/28/2020    9:29 AM  Fall Risk   Falls in the past year? 0 0 0 0 0  Number falls in past yr: 0 0 0 0 0  Injury with Fall? 0 0 0 0 0  Risk for fall due to : No Fall Risks No Fall Risks No Fall Risks No Fall Risks No Fall Risks  Follow up Falls evaluation completed Falls evaluation completed Falls evaluation completed;Education provided Falls evaluation completed Falls evaluation completed     Objective:   BP 124/78   Pulse 68   Ht 5' 6.25" (1.683 m)   Wt 186 lb 6.4 oz (84.6 kg)    BMI 29.86 kg/m   Last Weight  Most recent update: 10/08/2022  9:23 AM    Weight  84.6 kg (186 lb 6.4 oz)             Body mass index is 29.86 kg/m.  Hearing/Vision  Saul did not have difficulty with hearing/understanding during the face-to-face interview Savhanna did not have difficulty with her vision during the face-to-face interview Reports that she has had a formal eye exam by an eye care professional within the past year Reports that she has not had a formal hearing evaluation within the past year  Cognitive Function:     No data to display          Normal Cognitive Function Screening: Yes (Normal:0-7, Significant for Dysfunction: >8)  Immunization & Health Maintenance Record Immunization History  Administered Date(s) Administered   Fluad Quad(high Dose 65+) 01/11/2020, 01/18/2022   Influenza Inj Mdck Quad Pf 01/31/2017   Influenza,inj,Quad PF,6+ Mos 01/13/2014, 02/13/2015, 02/23/2016, 01/19/2018, 01/16/2019   Influenza-Unspecified 01/31/2017, 01/19/2018   PFIZER(Purple Top)SARS-COV-2 Vaccination 07/04/2019, 12/23/2019, 07/19/2020   Pfizer Covid-19 Vaccine Bivalent Booster 15yrs & up 12/25/2020   Pneumococcal Conjugate-13 03/19/2019   Pneumococcal Polysaccharide-23 08/25/2012   Td 09/15/2003   Td (Adult),5 Lf Tetanus Toxid, Preservative Free 09/15/2003   Tdap 02/13/2015   Unspecified SARS-COV-2 Vaccination 01/18/2022   Zoster Recombinant(Shingrix) 04/20/2018, 06/15/2018   Zoster, Live 02/21/2015    Health Maintenance  Topic Date Due   COVID-19 Vaccine (6 - 2023-24 season) 03/15/2022   MAMMOGRAM  10/31/2022   INFLUENZA VACCINE  11/14/2022   Colonoscopy  06/28/2023   Medicare Annual Wellness (AWV)  10/08/2023   Pneumonia Vaccine 75+ Years old (3 of 3 - PPSV23 or PCV20) 03/18/2024   DTaP/Tdap/Td (4 - Td or Tdap) 02/12/2025   DEXA SCAN  Completed   Hepatitis C Screening  Completed   Zoster Vaccines- Shingrix  Completed   HPV VACCINES  Aged Out        Assessment  This is a routine wellness examination for [employer].  Health Maintenance: Due or Overdue Health Maintenance Due  Topic Date Due   COVID-19 Vaccine (6 - 2023-24 season) 03/15/2022   MAMMOGRAM  10/31/2022    Brooke Pace does not need a referral for Community Assistance: Care Management:   not applicable Social Work:    not applicable Prescription Assistance:  not applicable Nutrition/Diabetes Education:  not applicable   Plan:  Personalized Goals  Goals Addressed             This Visit's Progress    Patient Stated       Finish PT and get back into the gym.        Personalized Health Maintenance & Screening Recommendations  Mammogram recommended  Lung Cancer Screening Recommended: no (  Low Dose CT Chest recommended if Age 79-80 years, 30 pack-year currently smoking OR have quit w/in past 15 years) Hepatitis C Screening recommended: no HIV Screening recommended: no  Advanced Directives: Written information was not given per the patient's request.  Referrals & Orders No orders of the defined types were placed in this encounter.   Follow-up Plan Follow-up with Caelan Atchley, Sung Amabile, NP as planned    I have personally reviewed and noted the following in the patient's chart:   Medical and social history Use of alcohol, tobacco or illicit drugs  Current medications and supplements Functional ability and status Nutritional status Physical activity Advanced directives List of other physicians Hospitalizations, surgeries, and ER visits in previous 12 months Vitals Screenings to include cognitive, depression, and falls Referrals and appointments  In addition, I have reviewed and discussed with patient certain preventive protocols, quality metrics, and best practice recommendations. A written personalized care plan for preventive services as well as general preventive health recommendations were provided to patient.     Tollie Eth, DNP, AGNP-c    11/13/2022

## 2022-10-08 NOTE — Patient Instructions (Signed)

## 2022-11-13 ENCOUNTER — Encounter: Payer: Self-pay | Admitting: Nurse Practitioner

## 2022-11-13 DIAGNOSIS — Z0000 Encounter for general adult medical examination without abnormal findings: Secondary | ICD-10-CM | POA: Insufficient documentation

## 2022-11-13 HISTORY — DX: Encounter for general adult medical examination without abnormal findings: Z00.00

## 2022-11-13 NOTE — Assessment & Plan Note (Signed)
Currently managed with zetia and rosuvastatin daily. No concerns at this time.  Plan: - Continue current medications - Follow low saturated fat and high fiber diet

## 2022-11-13 NOTE — Assessment & Plan Note (Signed)
Blood pressure is well controlled with no concerns at this time.  She is not on any medications currently for management.  Plan: - Monitor BP and notify if readings begin to climb greater than 130/80 n regular basis.

## 2022-11-13 NOTE — Assessment & Plan Note (Signed)
CPE completed today.   Labs ordered. Will make changes as necessary based on results.  Review of HM activities and recommendations discussed and provided on AVS Anticipatory guidance, diet, and exercise recommendations provided.  Medications, allergies, and hx reviewed and updated as necessary.  Plan to f/u with CPE in 1 year or sooner for acute/chronic health needs as directed.   

## 2022-11-13 NOTE — Assessment & Plan Note (Signed)
Patient's Crohn's disease is currently in remission. She is on Humira 80 mg and had a colonoscopy this year. No current symptoms reported.  Plan: - Continue current treatment with Humira.

## 2022-12-12 LAB — HM DEXA SCAN: HM Dexa Scan: NORMAL

## 2022-12-24 ENCOUNTER — Ambulatory Visit (INDEPENDENT_AMBULATORY_CARE_PROVIDER_SITE_OTHER): Payer: 59 | Admitting: Gastroenterology

## 2022-12-24 ENCOUNTER — Encounter: Payer: Self-pay | Admitting: Gastroenterology

## 2022-12-24 VITALS — BP 130/80 | HR 72 | Ht 67.0 in | Wt 200.0 lb

## 2022-12-24 DIAGNOSIS — Z86010 Personal history of colonic polyps: Secondary | ICD-10-CM | POA: Diagnosis not present

## 2022-12-24 DIAGNOSIS — K5080 Crohn's disease of both small and large intestine without complications: Secondary | ICD-10-CM | POA: Diagnosis not present

## 2022-12-24 DIAGNOSIS — Z79899 Other long term (current) drug therapy: Secondary | ICD-10-CM | POA: Diagnosis not present

## 2022-12-24 MED ORDER — PLENVU 140 G PO SOLR: 1.0000 | Freq: Once | ORAL | 0 refills | Status: AC

## 2022-12-24 NOTE — Progress Notes (Signed)
HPI :  67 y/o female here for follow up visit for Colon polyps and Crohn's disease.   Recall she previously followed with Dr. Christella Hartigan. In short, diagnosed with Crohn's disease in 2009.  Historically had been on some azathioprine back in 2014, she had erythema nodosum, ultimately transitioned to Humira when she had active disease on thiopurine monotherapy, in August 2018.  She has essentially been on Humira since that time with very good control of her symptoms.  Her index symptoms of Crohn's are loose stools and abdominal pain.  She generally was  in good control of her disease on Humira 40 mg every other week until last year when she had a flare of symptoms.  She has had a history of C. difficile in the past.  She tested negative for C. difficile with an elevated fecal calprotectin to 445 back in February of last year.  Dr. Christella Hartigan checked her Humira levels and her level was subtherapeutic without antibodies.  Her dose was increased to 80 mg every 2 weeks.  She states since that time her symptoms have been well-controlled and she is feeling well.  Since her last visit we rechecked her Humira levels and they were therapeutic at 15 without any antibodies.  This past March she had a colonoscopy for surveillance purposes.  Her Crohn's is in deep remission, no active inflammation in her colon or on biopsies.  Incidentally noted to have an advanced sessile serrated lesion in her cecum that was removed in piecemeal.  We had discussed doing a repeat colonoscopy 6 months later.  She continues to do well in regards to her Crohn's disease.  No abdominal pains.  No problems with her bowels.  No blood in her stools.  She generally is well feeling today and we discussed her Humira dosing and levels.  She wants to continue with present dosing as this has worked really well for her.   In regards to her immunization history we reviewed that today -she is due for seasonal flu shot and COVID booster. She has had an RSV  vaccine, COVID-vaccine, Pneumovax, Shingrix, all up-to-date and immunizations tab.       Review of pertinent gastrointestinal problems: 1.Crohn's ileocolitis: Workup at [facility] Kitchen[facility] 7 testing 02/2008 "pattern consistent with IBD.Marland KitchenMarland KitchenCrohn's." Colonoscopy 01/2008 showed terminal ileum and right colon ulcerations, pathology c/w acute and chronic ileitis, colitis, also HP polyps. Colonoscopy Path report from 2006 showed HP polyps. Office note 05/2009 documented previous C. diff colitis (treated empirically and also after toxin +). 2012 doing well on oral mesalamine 3.6 gms/day. ? Worsening of disease 03/2013, c. Diff PCR neg; increased mesalamine to 4.8gm daily. Colonoscopy 04/2012 found essentially normal colon but moderate to severe ileitis (path confirmed acute and chronic inflammation), started on 200mg  azathiaprine daily Had erythema nodosum 2014, early, improved with steroids. 11/2012 doing very well on 200 azathiaprine daily. 04/2013: urgency, straining: felt to be more functional then IBD related; fiber supplements stopped.  12/2013 doing well on 200 azathiaprine daily. 03/2015 MRI enterograophy "mild wall thickening in TI and cecum...active crohn's disease", elevated inflammatory markers and so azathiaprine dose increased to 250mg  daily; Decreased back to 200mg  daily due to blood counts, LFTs. Started humira 11/2016 40mg  [facility] Labs 10/2016: TB quant gold negative, hep B S Ag negative, Hep B S Ab positive TB quant gold 11/2019 negative Flare early 2023 with elevated inflammatory markers, elevated fecal calprotectin, nonbloody diarrhea, abdominal discomforts, GI pathogen panel was negative.  05/2021 Humira antibody negative, Humira drug  level 4.2 (lower than ideal) 05/2021 Humira dosing increased to 80 mg every other week 2. Dysphasia: EGD August, 2011 found Schatzki's ring that was dilated to 20 mm, small hiatal hernia, tortuous esophagus. Swallowing much improved. March 2012 PPI only every  other day. Repeat EGD 03/2013 for dysphagia showed 4cm HH, tortuous distal esophagus, Schatzki's ring that was dilated to 20mm. 3. Thrombosed external hemorrhoid 2016, this was around the time of constipation related to pain medicine after a knee surgery. Resolve with conservative therapy 4. C. Diff + PCR 12/2016; diarrhea; treated with 2 week oral vanc course; diarrhea significantly improved after the antibiotics.     QG 06/2021 - negative   Humira 06/01/21 - level of 4.2, undetectable antibody   Fecal calprotectin 05/23/21 - 445  QG negative 05/2022  Colonoscopy 06/28/22: - The perianal and digital rectal examinations were normal. - The terminal ileum appeared normal. - A roughly 20 mm polyp was found in the cecum. The polyp was sessile and located just behind the IC valve, difficult to visualize en bloc. The polyp was removed with a piecemeal technique using a cold snare. Resection and retrieval were complete. - Multiple medium-mouthed diverticula were found in the sigmoid colon. - Anal papilla(e) were hypertrophied. 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 - The colon was quite tortous. The exam was otherwise without abnormality. - Biopsies were taken with a cold forceps for histology in the right, left, and transverse colon.  1. Surgical [P], colon, cecum, polyp (1) - SESSILE SERRATED POLYP(S) WITHOUT CYTOLOGIC DYSPLASIA - MELANOSIS COLI 2. Surgical [P], right colon bx's - COLONIC MUCOSA WITH NO SPECIFIC HISTOPATHOLOGIC CHANGES - NEGATIVE FOR ACUTE INFLAMMATION, FEATURES OF CHRONICITY, GRANULOMAS OR DYSPLASIA - MELANOSIS COLI 3. Surgical [P], colon, transverse bx's - COLONIC MUCOSA WITH NO SPECIFIC HISTOPATHOLOGIC CHANGES - NEGATIVE FOR ACUTE INFLAMMATION, FEATURES OF CHRONICITY, GRANULOMAS OR DYSPLASIA - MELANOSIS COLI 4. Surgical [P], left colon bx's - COLONIC MUCOSA WITH NO SPECIFIC HISTOPATHOLOGIC CHANGES - NEGATIVE FOR ACUTE INFLAMMATION, FEATURES OF CHRONICITY,  GRANULOMAS OR DYSPLASIA - MELANOSIS COLI   Past Medical History:  Diagnosis Date   Chronic left shoulder pain 06/07/2022   Crohn's disease (HCC)    Dental crowns present    Encounter for annual physical exam 11/13/2022   GERD (gastroesophageal reflux disease)    High cholesterol    History of hiatal hernia    HTN (hypertension) 04/27/2018   Hyperlipidemia 01/13/2014   Osteoarthritis of knee, unilateral 09/2014   right    Patellofemoral arthritis of right knee 10/07/2014   Seasonal allergies      Past Surgical History:  Procedure Laterality Date   APPENDECTOMY     CESAREAN SECTION     x 3   CHOLECYSTECTOMY     COLONOSCOPY     ESOPHAGOGASTRODUODENOSCOPY (EGD) WITH ESOPHAGEAL DILATION     x 2   JOINT REPLACEMENT  07/11/2022   Shoulder replacement   PATELLA-FEMORAL ARTHROPLASTY Right 10/07/2014   Procedure: RIGHT PATELLA-FEMORAL ARTHROPLASTY;  Surgeon: Teryl Lucy, MD;  Location: [facility];  Service: Orthopedics;  Laterality: Right;   TONSILLECTOMY AND ADENOIDECTOMY     TUBAL LIGATION  O8/13/1991   WISDOM TOOTH EXTRACTION     Family History  Problem Relation Age of Onset   Arthritis Mother    Dementia Mother    Heart disease Father 71   Hyperlipidemia Father    Hypertension Father    Heart attack Father    Heart disease Brother 30  MI   Parkinson's disease Brother    Stroke Maternal Grandfather    Asthma Son    Colon cancer Neg Hx    Esophageal cancer Neg Hx    Pancreatic cancer Neg Hx    Stomach cancer Neg Hx    Liver disease Neg Hx    Social History   Tobacco Use   Smoking status: Never   Smokeless tobacco: Never  Vaping Use   Vaping status: Never Used  Substance Use Topics   Alcohol use: Yes    Alcohol/week: 4.0 standard drinks of alcohol    Types: 4 Glasses of wine per week    Comment: weekends   Drug use: No   Current Outpatient Medications  Medication Sig Dispense Refill   Adalimumab (HUMIRA PEN) 80 MG/0.8ML PNKT Inject  80 mg into the skin every 14 (fourteen) days. 2 each 11   azelastine (ASTELIN) 0.1 % nasal spray      CALCIUM CITRATE PO Take 1 tablet by mouth 2 (two) times daily.     Cholecalciferol (VITAMIN D-3 PO) Take 1 capsule by mouth at bedtime.     Cyanocobalamin (VITAMIN B-12 PO) Take 1 tablet by mouth daily.     DYMISTA 137-50 MCG/ACT SUSP Place 1 spray into the nose as needed.  6   ezetimibe (ZETIA) 10 MG tablet Take 1 tablet (10 mg total) by mouth daily. 90 tablet 3   fexofenadine (ALLEGRA) 180 MG tablet Take 180 mg by mouth daily.     fluticasone (CUTIVATE) 0.005 % ointment      fluticasone (FLONASE) 50 MCG/ACT nasal spray Place 1-2 sprays into both nostrils daily.     Iron-Folic Acid-Vit B12 (IRON FORMULA PO) Take by mouth.     levocetirizine (XYZAL) 5 MG tablet Take 5 mg by mouth every evening.     montelukast (SINGULAIR) 10 MG tablet Take 10 mg by mouth at bedtime.     Multiple Vitamin (MULITIVITAMIN WITH MINERALS) TABS Take 1 tablet by mouth 2 (two) times daily.     Omega-3 Fatty Acids (FISH OIL PO) Take 2 capsules by mouth 2 (two) times daily.      rosuvastatin (CRESTOR) 20 MG tablet TAKE 1 TABLET(20 MG) BY MOUTH DAILY 90 tablet 3   No current facility-administered medications for this visit.   No Known Allergies   Review of Systems: All systems reviewed and negative except where noted in HPI.   Lab Results  Component Value Date   WBC 6.3 05/31/2022   HGB 13.5 05/31/2022   HCT 41.0 05/31/2022   MCV 92.1 05/31/2022   PLT 292.0 05/31/2022    Lab Results  Component Value Date   NA 140 05/31/2022   CL 104 05/31/2022   K 4.1 05/31/2022   CO2 29 05/31/2022   BUN 13 05/31/2022   CREATININE 0.85 05/31/2022   GFR 70.78 05/31/2022   CALCIUM 9.8 05/31/2022   ALBUMIN 4.0 05/31/2022   GLUCOSE 90 05/31/2022    Lab Results  Component Value Date   ALT 12 05/31/2022   AST 17 05/31/2022   ALKPHOS 56 05/31/2022   BILITOT 0.5 05/31/2022     Physical Exam: BP 130/80   Pulse  72   Ht 5\' 7"  (1.702 m)   Wt 200 lb (90.7 kg)   BMI 31.32 kg/m  Constitutional: Pleasant,well-developed, female in no acute distress. Neurological: Alert and oriented to person place and time. Psychiatric: Normal mood and affect. Behavior is normal.   ASSESSMENT: 67 y.o. female here for assessment  of the following  1. History of colon polyps   2. Crohn's disease of both small and large intestine without complication (HCC)   3. High risk medication use    Advanced sessile serrated lesion removed in piecemeal from the cecum on her last colonoscopy in March.  We discussed that this polyp was large and precancerous in nature.  Recommending surveillance colonoscopy at this time to reassess the area, exclude recurrent/residual polypoid tissue at the site.  We discussed risk benefits of colonoscopy and anesthesia and she wants to proceed.  Otherwise reviewed her Crohn's disease history, currently doing well on higher dose Humira.  Recall she flared on Humira monotherapy dosed at 40 mg every other week and had subtherapeutic levels on that dose.  On higher dosing she has regained remission and clinically feeling well.  Her levels are a bit higher than needed but we discussed if she want to continue this dose or back to lower dosing.  I am concerned she may fail at lower dosing again and unclear if she would respond to dose escalation should that happen.  Following discussion of the risks and benefits of the regimen, and we discussed risks of the Humira, she wants to continue with it for now.  She is due for seasonal flu shot and COVID booster.  Otherwise confirmed both up-to-date and her vaccinations are otherwise up-to-date  PLAN: - colonoscopy [facility] - scheduled for next Monday 830 AM - continue Humira at present dose - recommended COVID booster and flu shot, otherwise vaccines UTD - f/u 6-9 months in the office otherwise  Harlin Rain, MD [facility]

## 2022-12-24 NOTE — Patient Instructions (Signed)
You have been scheduled for a colonoscopy. Please follow written instructions given to you at your visit today.   Please pick up your prep supplies at the pharmacy within the next 1-3 days.  If you use inhalers (even only as needed), please bring them with you on the day of your procedure.  DO NOT TAKE 7 DAYS PRIOR TO TEST- Trulicity (dulaglutide) Ozempic, Wegovy (semaglutide) Mounjaro (tirzepatide) Bydureon Bcise (exanatide extended release)  DO NOT TAKE 1 DAY PRIOR TO YOUR TEST Rybelsus (semaglutide) Adlyxin (lixisenatide) Victoza (liraglutide) Byetta (exanatide) ___________________________________________________________________________  Please continue Humira.  Thank you for entrusting me with your care and for choosing [facility], Dr. Ileene Patrick    If your blood pressure at your visit was 140/90 or greater, please contact your primary care physician to follow up on this. ______________________________________________________  If you are age 56 or older, your body mass index should be between 23-30. Your Body mass index is 31.32 kg/m. If this is out of the aforementioned range listed, please consider follow up with your Primary Care Provider.  If you are age 65 or younger, your body mass index should be between 19-25. Your Body mass index is 31.32 kg/m. If this is out of the aformentioned range listed, please consider follow up with your Primary Care Provider.  ________________________________________________________  The [facility] providers would like to encourage you to use [facility] to communicate with providers for non-urgent requests or questions.  Due to long hold times on the telephone, sending your provider a message by [facility] may be a faster and more efficient way to get a response.  Please allow 48 business hours for a response.  Please remember that this is for non-urgent requests.  _______________________________________________________  Due to  recent changes in healthcare laws, you may see the results of your imaging and laboratory studies on MyChart before your provider has had a chance to review them.  We understand that in some cases there may be results that are confusing or concerning to you. Not all laboratory results come back in the same time frame and the provider may be waiting for multiple results in order to interpret others.  Please give [place] 48 hours in order for your provider to thoroughly review all the results before contacting the office for clarification of your results.

## 2022-12-30 ENCOUNTER — Encounter: Payer: Self-pay | Admitting: Gastroenterology

## 2022-12-30 ENCOUNTER — Ambulatory Visit (AMBULATORY_SURGERY_CENTER): Payer: 59 | Admitting: Gastroenterology

## 2022-12-30 VITALS — BP 172/85 | HR 60 | Temp 97.1°F | Resp 18 | Ht 67.0 in | Wt 200.0 lb

## 2022-12-30 DIAGNOSIS — Z86010 Personal history of colonic polyps: Secondary | ICD-10-CM

## 2022-12-30 DIAGNOSIS — Z09 Encounter for follow-up examination after completed treatment for conditions other than malignant neoplasm: Secondary | ICD-10-CM

## 2022-12-30 DIAGNOSIS — D120 Benign neoplasm of cecum: Secondary | ICD-10-CM

## 2022-12-30 LAB — HM COLONOSCOPY

## 2022-12-30 MED ORDER — SODIUM CHLORIDE 0.9 % IV SOLN: 500.0000 mL | INTRAVENOUS | Status: DC

## 2022-12-30 NOTE — Progress Notes (Signed)
Called to room to assist during endoscopic procedure.  Patient ID and intended procedure confirmed with present staff. Received instructions for my participation in the procedure from the performing physician.  

## 2022-12-30 NOTE — Op Note (Signed)
[facility] Patient Name: Connie Porter Procedure Date: 12/30/2022 8:44 AM MRN: [id number] Endoscopist: Viviann Spare P. Adela Lank , MD, [id number] Age: 67 Referring MD:  Date of Birth: Dec 31, 1955 Gender: Female Account #: [id number] Procedure:                Colonoscopy Indications:              High risk colon cancer surveillance: Personal                            history of colonic polyps - large cecal polyp                            removed in piecemeal 06/2022. History of Crohn's in                            remission on Humira. Medicines:                Monitored Anesthesia Care Procedure:                Pre-Anesthesia Assessment:                           - Prior to the procedure, a History and Physical                            was performed, and patient medications and                            allergies were reviewed. The patient's tolerance of                            previous anesthesia was also reviewed. The risks                            and benefits of the procedure and the sedation                            options and risks were discussed with the patient.                            All questions were answered, and informed consent                            was obtained. Prior Anticoagulants: The patient has                            taken no anticoagulant or antiplatelet agents. ASA                            Grade Assessment: II - A patient with mild systemic                            disease. After reviewing the risks and benefits,  the patient was deemed in satisfactory condition to                            undergo the procedure.                           After obtaining informed consent, the colonoscope                            was passed under direct vision. Throughout the                            procedure, the patient's blood pressure, pulse, and                            oxygen saturations were monitored  continuously. The                            Olympus Scope [id number] was introduced through the                            anus and advanced to the the cecum, identified by                            appendiceal orifice and ileocecal valve. The                            colonoscopy was performed without difficulty. The                            patient tolerated the procedure well. The quality                            of the bowel preparation was adequate. The                            ileocecal valve, appendiceal orifice, and rectum                            were photographed. Scope In: 8:51:27 AM Scope Out: 9:20:25 AM Scope Withdrawal Time: 0 hours 23 minutes 29 seconds  Total Procedure Duration: 0 hours 28 minutes 58 seconds  Findings:                 The perianal and digital rectal examinations were                            normal.                           A large post polypectomy scar was found in the                            cecum. It was quite challenging to see the entire  scar given it's location behind the IC valve.                            Initially this was a challenging polypectomy due to                            this reason. The IC valve was pleated back with                            snare tip - some subtle possible flat polypoid                            mucosa was noted in one portion along the scar.                            Unclear if this is normal granulation tissue or                            flat polypoid tissue. Cold snare was used and able                            to remove most of this area / scar. Cold forceps                            avulsion technique then used to remove some small                            areas of flat mucosa not able to be removed with                            snare. Following treatment the site did not appear                            to show any residual tissue at the scar.                            A diffuse area of mild melanosis was found in the                            entire colon.                           Multiple small-mouthed diverticula were found in                            the left colon.                           Anal papilla(e) were hypertrophied.                           The exam was otherwise without abnormality. Of  note, residual stool noted throughout the colon.                            Extensive lavage done in the right colon which was                            most affected, to achieve adequate views. Complications:            No immediate complications. Estimated blood loss:                            Minimal. Estimated Blood Loss:     Estimated blood loss was minimal. Impression:               - Post-polypectomy scar in the cecum. Biopsied /                            removed as outlined above.                           - Melanosis in the colon.                           - Diverticulosis in the left colon.                           - Anal papilla(e) were hypertrophied.                           - The examination was otherwise normal. Recommendation:           - Patient has a contact number available for                            emergencies. The signs and symptoms of potential                            delayed complications were discussed with the                            patient. Return to normal activities tomorrow.                            Written discharge instructions were provided to the                            patient.                           - Resume previous diet.                           - Continue present medications.                           - Await pathology results. Viviann Spare P. Teara Duerksen, MD 12/30/2022 9:29:00 AM This report has been signed electronically.

## 2022-12-30 NOTE — Progress Notes (Signed)
Pt awake, alert and oriented. VSS. Airway intact. SBAR complete to RN. All questions answered.

## 2022-12-30 NOTE — Patient Instructions (Signed)
Resume previous diet and medications. Awaiting pathology results. Repeat Colonoscopy date to be determined based on pathology results.  YOU HAD AN ENDOSCOPIC PROCEDURE TODAY AT THE [facility]:   Refer to the procedure report that was given to you for any specific questions about what was found during the examination.  If the procedure report does not answer your questions, please call your gastroenterologist to clarify.  If you requested that your care partner not be given the details of your procedure findings, then the procedure report has been included in a sealed envelope for you to review at your convenience later.  YOU SHOULD EXPECT: Some feelings of bloating in the abdomen. Passage of more gas than usual.  Walking can help get rid of the air that was put into your GI tract during the procedure and reduce the bloating. If you had a lower endoscopy (such as a colonoscopy or flexible sigmoidoscopy) you may notice spotting of blood in your stool or on the toilet paper. If you underwent a bowel prep for your procedure, you may not have a normal bowel movement for a few days.  Please Note:  You might notice some irritation and congestion in your nose or some drainage.  This is from the oxygen used during your procedure.  There is no need for concern and it should clear up in a day or so.  SYMPTOMS TO REPORT IMMEDIATELY:  Following lower endoscopy (colonoscopy or flexible sigmoidoscopy):  Excessive amounts of blood in the stool  Significant tenderness or worsening of abdominal pains  Swelling of the abdomen that is new, acute  Fever of 100F or higher   For urgent or emergent issues, a gastroenterologist can be reached at any hour by calling [phone]. Do not use MyChart messaging for urgent concerns.    DIET:  We do recommend a small meal at first, but then you may proceed to your regular diet.  Drink plenty of fluids but you should avoid alcoholic beverages for 24  hours.  ACTIVITY:  You should plan to take it easy for the rest of today and you should NOT DRIVE or use heavy machinery until tomorrow (because of the sedation medicines used during the test).    FOLLOW UP: Our staff will call the number listed on your records the next business day following your procedure.  We will call around 7:15- 8:00 am to check on you and address any questions or concerns that you may have regarding the information given to you following your procedure. If we do not reach you, we will leave a message.     If any biopsies were taken you will be contacted by phone or by letter within the next 1-3 weeks.  Please call us at [phone] if you have not heard about the biopsies in 3 weeks.    SIGNATURES/CONFIDENTIALITY: You and/or your care partner have signed paperwork which will be entered into your electronic medical record.  These signatures attest to the fact that that the information above on your After Visit Summary has been reviewed and is understood.  Full responsibility of the confidentiality of this discharge information lies with you and/or your care-partner. 

## 2022-12-30 NOTE — Progress Notes (Signed)
Patient states there have been no changes to medical or surgical history since time of pre-visit. 

## 2022-12-30 NOTE — Progress Notes (Signed)
History and Physical Interval Note: Large cecal polyp removed 06/2022 in piecemeal - here for surveillance colonoscopy to reassess the area and ensure no residual / recurrent polypoid tissue. History of Crohn's in remission on Humira. She wishes to proceed. No interval changes since office visit on 9/10.  12/30/2022 8:43 AM  Connie Porter  has presented today for endoscopic procedure(s), with the diagnosis of  Encounter Diagnosis  Name Primary?   History of colon polyps Yes  .  The various methods of evaluation and treatment have been discussed with the patient and/or family. After consideration of risks, benefits and other options for treatment, the patient has consented to  the endoscopic procedure(s).   The patient's history has been reviewed, patient examined, no change in status, stable for surgery.  I have reviewed the patient's chart and labs.  Questions were answered to the patient's satisfaction.    Harlin Rain, MD [facility]

## 2022-12-31 ENCOUNTER — Telehealth: Payer: Self-pay

## 2022-12-31 NOTE — Telephone Encounter (Signed)
  Follow up Call-     12/30/2022    7:37 AM 06/28/2022    2:28 PM  Call back number  Post procedure Call Back phone  # [phone]  Permission to leave phone message Yes Yes     Patient questions:  Do you have a fever, pain , or abdominal swelling? No. Pain Score  0 *  Have you tolerated food without any problems? Yes.    Have you been able to return to your normal activities? Yes.    Do you have any questions about your discharge instructions: Diet   No. Medications  No. Follow up visit  No.  Do you have questions or concerns about your Care? No.  Actions: * If pain score is 4 or above: No action needed, pain <4.

## 2023-01-15 LAB — HM MAMMOGRAPHY

## 2023-01-21 ENCOUNTER — Encounter: Payer: Self-pay | Admitting: Nurse Practitioner

## 2023-01-27 ENCOUNTER — Encounter: Payer: Self-pay | Admitting: Nurse Practitioner

## 2023-01-31 ENCOUNTER — Ambulatory Visit (INDEPENDENT_AMBULATORY_CARE_PROVIDER_SITE_OTHER): Payer: 59 | Admitting: Medical

## 2023-01-31 VITALS — BP 120/80 | HR 72 | Temp 98.2°F | Resp 16 | Wt 202.4 lb

## 2023-01-31 DIAGNOSIS — R490 Dysphonia: Secondary | ICD-10-CM

## 2023-01-31 DIAGNOSIS — J0110 Acute frontal sinusitis, unspecified: Secondary | ICD-10-CM

## 2023-01-31 MED ORDER — AMOXICILLIN 875 MG PO TABS: 875.0000 mg | ORAL_TABLET | Freq: Two times a day (BID) | ORAL | 0 refills | Status: AC

## 2023-01-31 MED ORDER — BENZONATATE 200 MG PO CAPS
200.0000 mg | ORAL_CAPSULE | Freq: Three times a day (TID) | ORAL | 0 refills | Status: DC | PRN
Start: 2023-01-31 — End: 2023-03-17

## 2023-01-31 NOTE — Progress Notes (Signed)
Subjective:  Connie Porter is a 67 y.o. female who presents for Chief Complaint  Patient presents with   Sinusitis    Sinus infection since last weekend. Sinus pressure, face pain, teeth pain, congested. Get this every year. Negative covid yesterday     Here for possible sinus infection x about a week.  Worsening and not getting better.  Symptom include sinus pressure, teeth ache, congested, hoarse voice now, congestion in throat, cough now in recent days from drainage.  No vomiting but some nausea.  No SOB or wheezing.  No fever.  No body ache or chills.   No bloody mucous.   Getting up phlegm/yellow.  Was using mucinex D and allergy medicaiton but not improving.  No other aggravating or relieving factors.    No other c/o.  Past Medical History:  Diagnosis Date   Chronic left shoulder pain 06/07/2022   Crohn's disease (HCC)    Dental crowns present    Encounter for annual physical exam 11/13/2022   GERD (gastroesophageal reflux disease)    High cholesterol    History of hiatal hernia    HTN (hypertension) 04/27/2018   Hyperlipidemia 01/13/2014   Osteoarthritis of knee, unilateral 09/2014   right    Patellofemoral arthritis of right knee 10/07/2014   Seasonal allergies    Current Outpatient Medications on File Prior to Visit  Medication Sig Dispense Refill   Adalimumab (HUMIRA PEN) 80 MG/0.8ML PNKT Inject 80 mg into the skin every 14 (fourteen) days. 2 each 11   azelastine (ASTELIN) 0.1 % nasal spray      CALCIUM CITRATE PO Take 1 tablet by mouth 2 (two) times daily.     Cholecalciferol (VITAMIN D-3 PO) Take 1 capsule by mouth at bedtime.     Cyanocobalamin (VITAMIN B-12 PO) Take 1 tablet by mouth daily.     ezetimibe (ZETIA) 10 MG tablet Take 1 tablet (10 mg total) by mouth daily. 90 tablet 3   fexofenadine (ALLEGRA) 180 MG tablet Take 180 mg by mouth daily.     fluticasone (CUTIVATE) 0.005 % ointment      Iron-Folic Acid-Vit B12 (IRON FORMULA PO) Take by mouth.      levocetirizine (XYZAL) 5 MG tablet Take 5 mg by mouth every evening.     montelukast (SINGULAIR) 10 MG tablet Take 10 mg by mouth at bedtime.     Multiple Vitamin (MULITIVITAMIN WITH MINERALS) TABS Take 1 tablet by mouth 2 (two) times daily.     Omega-3 Fatty Acids (FISH OIL PO) Take 2 capsules by mouth 2 (two) times daily.      rosuvastatin (CRESTOR) 20 MG tablet TAKE 1 TABLET(20 MG) BY MOUTH DAILY 90 tablet 3   No current facility-administered medications on file prior to visit.    The following portions of the patient's history were reviewed and updated as appropriate: allergies, current medications, past family history, past medical history, past social history, past surgical history and problem list.  ROS Otherwise as in subjective above    Objective: BP 120/80   Pulse 72   Temp 98.2 F (36.8 C)   Resp 16   Wt 202 lb 6.4 oz (91.8 kg)   SpO2 99%   BMI 31.70 kg/m   General appearance: alert, no distress, well developed, well nourished, somewhat hoarse HEENT: normocephalic, sclerae anicteric, conjunctiva pink and moist, TMs flat, nares with turbinated edema, clear discharge , mild erythema, pharynx normal Oral cavity: MMM, no lesions Neck: supple, no lymphadenopathy, no thyromegaly, no masses Lungs:  CTA bilaterally, no wheezes, rhonchi, or rales   Assessment: Encounter Diagnoses  Name Primary?   Acute non-recurrent frontal sinusitis Yes   Hoarse      Plan: Symptoms are suggestive of sinusitis. Continue nasal saline, rest, good hydration.  Begin the medications below.  If not much improved over the next 3 to 4 days then call or recheck.  Of note last Humira injection was a week ago and not due again for another week  [name] was seen today for sinusitis.  Diagnoses and all orders for this visit:  Acute non-recurrent frontal sinusitis  Hoarse  Other orders -     amoxicillin (AMOXIL) 875 MG tablet; Take 1 tablet (875 mg total) by mouth 2 (two) times daily for 10  days. -     benzonatate (TESSALON) 200 MG capsule; Take 1 capsule (200 mg total) by mouth 3 (three) times daily as needed for cough.    Follow up: prn

## 2023-02-03 ENCOUNTER — Ambulatory Visit (HOSPITAL_COMMUNITY)
Admission: RE | Admit: 2023-02-03 | Discharge: 2023-02-03 | Disposition: A | Discharge status: Home / Self Care | Payer: 59 | Source: Ambulatory Visit | Attending: Cardiology | Admitting: Cardiology

## 2023-02-03 ENCOUNTER — Other Ambulatory Visit (HOSPITAL_COMMUNITY): Payer: Self-pay | Admitting: Orthopaedic Surgery

## 2023-02-03 DIAGNOSIS — M7989 Other specified soft tissue disorders: Secondary | ICD-10-CM | POA: Insufficient documentation

## 2023-02-03 DIAGNOSIS — M79661 Pain in right lower leg: Secondary | ICD-10-CM | POA: Diagnosis not present

## 2023-02-04 ENCOUNTER — Telehealth: Payer: Self-pay | Admitting: Nurse Practitioner

## 2023-02-04 NOTE — Telephone Encounter (Signed)
Please call ,pt states you told her to call if not any better and she is not any better

## 2023-02-05 ENCOUNTER — Other Ambulatory Visit: Payer: Self-pay | Admitting: Medical

## 2023-02-05 MED ORDER — PREDNISONE 20 MG PO TABS: ORAL_TABLET | ORAL | 0 refills | Status: DC

## 2023-02-05 NOTE — Telephone Encounter (Signed)
Pt was notified.  

## 2023-02-25 ENCOUNTER — Other Ambulatory Visit: Payer: Self-pay | Admitting: *Deleted

## 2023-02-25 MED ORDER — HUMIRA (2 PEN) 80 MG/0.8ML ~~LOC~~ AJKT: 80.0000 mg | AUTO-INJECTOR | SUBCUTANEOUS | 11 refills | Status: DC

## 2023-03-15 ENCOUNTER — Other Ambulatory Visit: Payer: Self-pay | Admitting: Cardiovascular Disease

## 2023-03-15 DIAGNOSIS — I2510 Atherosclerotic heart disease of native coronary artery without angina pectoris: Secondary | ICD-10-CM

## 2023-03-15 DIAGNOSIS — E782 Mixed hyperlipidemia: Secondary | ICD-10-CM

## 2023-03-16 ENCOUNTER — Encounter: Payer: Self-pay | Admitting: Cardiovascular Disease

## 2023-03-16 NOTE — Progress Notes (Unsigned)
Cardiology Office Note:    Date:  03/17/2023   ID:  Daneli, Musacchio [birth year], MRN [id number]  PCP:  Tollie Eth, NP   [facility] Providers Cardiologist:  Elease Hashimoto    Referring MD: Tollie Eth, NP   Chief Complaint  Patient presents with   Hyperlipidemia    HSept. 2, 2022   Connie Porter is a 67 y.o. female with a hx of chest pain.  We were asked to see her today by Hetty Blend NP for further evaluation of her chest pain and hyperlipidemia.  Had an abn ecg with Vickie and it was not normal   Goes to the gym 3 days a week  Walks in addition  Some atypical CP  - she thnks she may have pulled something  No cp while wokring   Non smoker  Strong family hx of CAD - premature CAD  Retired from Agricultural consultant ( high school , special ed for 30 year, [place])   Has Crohns disease , takes Humira   Dec. 2, 2022 Connie Porter is seen for follow up visit  Strong fam hx of CAD Coronary CT angio revealed A large hiatal hernia Coronary calcium score is 5, which places the patient in the 54th percentile for age and sex matched control  Right Coronary Artery: Minimal mixed atherosclerotic plaque in the mid RCA, <25% stenosis.   Left Main Coronary Artery: Minimal mixed atherosclerotic plaque in the distal left main, <25% stenosis.   Left Anterior Descending Coronary Artery: Minimal mixed atherosclerotic plaque in the ostial LAD, <25% stenosis.   Left Circumflex Artery: No detectable plaque or stenosis.   Aorta: Borderline dilation, 39 mm at the mid ascending aorta (level of the PA bifurcation) measured double oblique. Trivial calcifications. No dissection.  Wt is 225 lbs   Dec. 4, 2023 Connie Porter is seen for follow up of her mild CAD / coronary artery calcification , hyperlipidemia  Wt is 204 lbs   Needs a shoulder replacement ,  is in with [place] weight loss  She is at low risk for her shoulder replacement .   Dec. 2, 2024 Connie Porter is seen for follow up of her mild  CAD, hyperlipidemia  Wt is  206 lbs   Bp is mildly elevated.  Perhaps ate more salt of the Thanksgiving holiday   Was walking 4 miles a day  Now has a right knee injury ( rupture of Bakers cyst)  is in a brace  No walking for the past 2 months     Past Medical History:  Diagnosis Date   Chronic left shoulder pain 06/07/2022   Crohn's disease (HCC)    Dental crowns present    Encounter for annual physical exam 11/13/2022   GERD (gastroesophageal reflux disease)    High cholesterol    History of hiatal hernia    HTN (hypertension) 04/27/2018   Hyperlipidemia 01/13/2014   Osteoarthritis of knee, unilateral 09/2014   right    Patellofemoral arthritis of right knee 10/07/2014   Seasonal allergies     Past Surgical History:  Procedure Laterality Date   APPENDECTOMY     CESAREAN SECTION     x 3   CHOLECYSTECTOMY     COLONOSCOPY     ESOPHAGOGASTRODUODENOSCOPY (EGD) WITH ESOPHAGEAL DILATION     x 2   JOINT REPLACEMENT  07/11/2022   Shoulder replacement   PATELLA-FEMORAL ARTHROPLASTY Right 10/07/2014   Procedure: RIGHT PATELLA-FEMORAL ARTHROPLASTY;  Surgeon: Teryl Lucy, MD;  Location: [facility];  Service: Orthopedics;  Laterality: Right;   SHOULDER SURGERY Left    TONSILLECTOMY AND ADENOIDECTOMY     TUBAL LIGATION  O8/13/1991   WISDOM TOOTH EXTRACTION      Current Medications: Current Meds  Medication Sig   Adalimumab (HUMIRA PEN) 80 MG/0.8ML PNKT Inject 80 mg into the skin every 14 (fourteen) days.   amoxicillin (AMOXIL) 500 MG tablet TAKE 4 TABLETS BY MOUTH 1 HOUR BEFORE DENTAL WORK   CALCIUM CITRATE PO Take 1 tablet by mouth 2 (two) times daily.   Cholecalciferol (VITAMIN D-3 PO) Take 1 capsule by mouth at bedtime.   Cyanocobalamin (VITAMIN B-12 PO) Take 1 tablet by mouth daily.   cyclobenzaprine (FLEXERIL) 5 MG tablet Take 5 mg by mouth at bedtime as needed.   ezetimibe (ZETIA) 10 MG tablet Take 1 tablet (10 mg total) by mouth daily.    fexofenadine (ALLEGRA) 180 MG tablet Take 180 mg by mouth daily.   Iron-Folic Acid-Vit B12 (IRON FORMULA PO) Take by mouth.   levocetirizine (XYZAL) 5 MG tablet Take 5 mg by mouth every evening.   montelukast (SINGULAIR) 10 MG tablet Take 10 mg by mouth at bedtime.   Multiple Vitamin (MULITIVITAMIN WITH MINERALS) TABS Take 1 tablet by mouth 2 (two) times daily.   Omega-3 Fatty Acids (FISH OIL PO) Take 2 capsules by mouth 2 (two) times daily.    rosuvastatin (CRESTOR) 20 MG tablet TAKE 1 TABLET(20 MG) BY MOUTH DAILY   valsartan (DIOVAN) 80 MG tablet Take 1 tablet (80 mg total) by mouth daily.     Allergies:   Patient has no known allergies.   Social History   Socioeconomic History   Marital status: Married    Spouse name: Not on file   Number of children: 3   Years of education: Not on file   Highest education level: Not on file  Occupational History   Occupation: Retired Runner, broadcasting/film/video  Tobacco Use   Smoking status: Never   Smokeless tobacco: Never  Vaping Use   Vaping status: Never Used  Substance and Sexual Activity   Alcohol use: Yes    Alcohol/week: 4.0 standard drinks of alcohol    Types: 4 Glasses of wine per week    Comment: weekends   Drug use: No   Sexual activity: Yes    Birth control/protection: Post-menopausal  Other Topics Concern   Not on file  Social History Narrative   Not on file   Social Determinants of Health   Financial Resource Strain: Low Risk  (10/08/2022)   Overall Financial Resource Strain (CARDIA)    Difficulty of Paying Living Expenses: Not very hard  Food Insecurity: No Food Insecurity (10/08/2022)   Hunger Vital Sign    Worried About Running Out of Food in the Last Year: Never true    Ran Out of Food in the Last Year: Never true  Transportation Needs: No Transportation Needs (10/08/2022)   PRAPARE - Administrator, Civil Service (Medical): No    Lack of Transportation (Non-Medical): No  Physical Activity: Not on file  Stress: No  Stress Concern Present (10/08/2022)   [employer] of Occupational Health - Occupational Stress Questionnaire    Feeling of Stress : Only a little  Social Connections: Unknown (10/08/2022)   Social Connection and Isolation Panel [NHANES]    Frequency of Communication with Friends and Family: More than three times a week    Frequency of Social Gatherings with Friends and Family: Three times a week  Attends Religious Services: Patient declined    Active Member of Clubs or Organizations: No    Attends Banker Meetings: Never    Marital Status: Married     Family History: The patient's family history includes Arthritis in her mother; Asthma in her son; Dementia in her mother; Heart attack in her father; Heart disease (age of onset: 43) in her brother; Heart disease (age of onset: 65) in her father; Hyperlipidemia in her father; Hypertension in her father; Parkinson's disease in her brother; Stroke in her maternal grandfather. There is no history of Colon cancer, Esophageal cancer, Pancreatic cancer, Stomach cancer, Liver disease, or Rectal cancer.  ROS:   Please see the history of present illness.     All other systems reviewed and are negative.  EKGs/Labs/Other Studies Reviewed:    The following studies were reviewed today:   EKG:      Recent Labs: 05/31/2022: ALT 12; BUN 13; Creatinine, Ser 0.85; Hemoglobin 13.5; Platelets 292.0; Potassium 4.1; Sodium 140  Recent Lipid Panel    Component Value Date/Time   CHOL 104 03/18/2022 1653   TRIG 75 03/18/2022 1653   HDL 62 03/18/2022 1653   CHOLHDL 1.7 03/18/2022 1653   CHOLHDL 2.2 02/07/2017 1128   VLDL 10 02/23/2016 1056   LDLCALC 27 03/18/2022 1653   LDLCALC 95 02/07/2017 1128     Risk Assessment/Calculations:           Physical Exam:      Physical Exam: Blood pressure (!) 140/100, pulse 81, height 5\' 7"  (1.702 m), weight 206 lb 9.6 oz (93.7 kg), SpO2 96%.  HYPERTENSION CONTROL Vitals:   03/17/23  1541 03/17/23 1555  BP: (!) 146/100 (!) 140/100    The patient's blood pressure is elevated above target today.  In order to address the patient's elevated BP: A new medication was prescribed today.       GEN:  Well nourished, well developed in no acute distress HEENT: Normal NECK: No JVD; No carotid bruits LYMPHATICS: No lymphadenopathy CARDIAC: RRR , no murmurs, rubs, gallops RESPIRATORY:  Clear to auscultation without rales, wheezing or rhonchi  ABDOMEN: Soft, non-tender, non-distended MUSCULOSKELETAL:  No edema; No deformity  SKIN: Warm and dry NEUROLOGIC:  Alert and oriented x 3      ASSESSMENT:    1. Coronary artery disease involving native coronary artery of native heart without angina pectoris   2. Primary hypertension      PLAN:       HTN:   BP is higher.  She has been very inactive for the past 2 months since her right knee injury.   Will add Valsartan 80 mg a day .  Check BMP in 2 weeks  Follow up with an APP in 2-3 months     2.  Obesity:   advised dietary restriction for better weight loss.   3. Right knee injury .  - further plans per sports medicine / ortho .   Will work on getting her BP controlled so she can have drainage procedure / repair .        Medication Adjustments/Labs and Tests Ordered: Current medicines are reviewed at length with the patient today.  Concerns regarding medicines are outlined above.  Orders Placed This Encounter  Procedures   Basic metabolic panel   EKG 12-Lead    Meds ordered this encounter  Medications   valsartan (DIOVAN) 80 MG tablet    Sig: Take 1 tablet (80 mg total) by mouth daily.  Dispense:  90 tablet    Refill:  1     Patient Instructions  Medication Instructions:  Your physician has recommended you make the following change in your medication:   1) START valsartan (Diovan) 80 mg daily  *If you need a refill on your cardiac medications before your next appointment, please call your  pharmacy*  Lab Work: In 2-3 weeks (December 16th-23rd): BMP You may go to any [facility] office to have your labs drawn. There is one in our building on the first floor, [address] and another office located on [address]. You do not need an appointment for labs.  If you have labs (blood work) drawn today and your tests are completely normal, you will receive your results only by: MyChart Message (if you have MyChart) OR A paper copy in the mail If you have any lab test that is abnormal or we need to change your treatment, we will call you to review the results.  Testing/Procedures: None ordered today.  Follow-Up: At [facility], you and your health needs are our priority.  As part of our continuing mission to provide you with exceptional heart care, we have created designated Provider Care Teams.  These Care Teams include your primary Cardiologist (physician) and Advanced Practice Providers (APPs -  Physician Assistants and Nurse Practitioners) who all work together to provide you with the care you need, when you need it.  Your next appointment:   2-3 month(s)  The format for your next appointment:   In Person  Provider:   Jari Favre, PA-C, Eligha Bridegroom, NP, or Tereso Newcomer, PA-C   Signed, Kristeen Miss, MD  03/17/2023 5:37 PM    [facility]

## 2023-03-17 ENCOUNTER — Encounter: Payer: Self-pay | Admitting: Cardiovascular Disease

## 2023-03-17 ENCOUNTER — Ambulatory Visit: Payer: 59 | Attending: Cardiovascular Disease | Admitting: Cardiovascular Disease

## 2023-03-17 VITALS — BP 140/100 | HR 81 | Ht 67.0 in | Wt 206.6 lb

## 2023-03-17 DIAGNOSIS — I10 Essential (primary) hypertension: Secondary | ICD-10-CM

## 2023-03-17 DIAGNOSIS — I2510 Atherosclerotic heart disease of native coronary artery without angina pectoris: Secondary | ICD-10-CM

## 2023-03-17 MED ORDER — VALSARTAN 80 MG PO TABS: 80.0000 mg | ORAL_TABLET | Freq: Every day | ORAL | 1 refills | Status: DC

## 2023-03-17 NOTE — Patient Instructions (Addendum)
Medication Instructions:  Your physician has recommended you make the following change in your medication:   1) START valsartan (Diovan) 80 mg daily  *If you need a refill on your cardiac medications before your next appointment, please call your pharmacy*  Lab Work: In 2-3 weeks (December 16th-23rd): BMP You may go to any [facility] office to have your labs drawn. There is one in our building on the first floor, [address] and another office located on [address]. You do not need an appointment for labs.  If you have labs (blood work) drawn today and your tests are completely normal, you will receive your results only by: MyChart Message (if you have MyChart) OR A paper copy in the mail If you have any lab test that is abnormal or we need to change your treatment, we will call you to review the results.  Testing/Procedures: None ordered today.  Follow-Up: At [facility], you and your health needs are our priority.  As part of our continuing mission to provide you with exceptional heart care, we have created designated Provider Care Teams.  These Care Teams include your primary Cardiologist (physician) and Advanced Practice Providers (APPs -  Physician Assistants and Nurse Practitioners) who all work together to provide you with the care you need, when you need it.  Your next appointment:   2-3 month(s)  The format for your next appointment:   In Person  Provider:   Jari Favre, PA-C, Eligha Bridegroom, NP, or Tereso Newcomer, PA-C

## 2023-03-18 ENCOUNTER — Telehealth: Payer: Self-pay | Admitting: Cardiovascular Disease

## 2023-03-18 DIAGNOSIS — I2510 Atherosclerotic heart disease of native coronary artery without angina pectoris: Secondary | ICD-10-CM

## 2023-03-18 DIAGNOSIS — E782 Mixed hyperlipidemia: Secondary | ICD-10-CM

## 2023-03-18 MED ORDER — EZETIMIBE 10 MG PO TABS: 10.0000 mg | ORAL_TABLET | Freq: Every day | ORAL | 3 refills | Status: DC

## 2023-03-18 NOTE — Telephone Encounter (Signed)
*  STAT* If patient is at the pharmacy, call can be transferred to refill team.   1. Which medications need to be refilled? (please list name of each medication and dose if known) ezetimibe (ZETIA) 10 MG tablet (Expired)   2. Which pharmacy/location (including street and city if local pharmacy) is medication to be sent to? [id number] - [place], [place] - [address]   3. Do they need a 30 day or 90 day supply? 90

## 2023-03-24 ENCOUNTER — Other Ambulatory Visit: Payer: Self-pay

## 2023-03-24 DIAGNOSIS — R202 Paresthesia of skin: Secondary | ICD-10-CM

## 2023-03-28 ENCOUNTER — Telehealth: Payer: Self-pay | Admitting: Pharmacy Technician

## 2023-03-28 ENCOUNTER — Ambulatory Visit (INDEPENDENT_AMBULATORY_CARE_PROVIDER_SITE_OTHER): Payer: 59 | Admitting: Neurology

## 2023-03-28 DIAGNOSIS — M5417 Radiculopathy, lumbosacral region: Secondary | ICD-10-CM

## 2023-03-28 DIAGNOSIS — R202 Paresthesia of skin: Secondary | ICD-10-CM | POA: Diagnosis not present

## 2023-03-28 NOTE — Procedures (Signed)
  [facility] Neurology  [address] [place], [address]  [place], [place] [address] Tel: [phone] Fax: [phone] Test Date:  03/28/2023  Patient: Connie Porter DOB: 02-07-1956 Physician: Nita Sickle, DO  Sex: Female Height: 5\' 7"  Ref Phys: Teryl Lucy, MD  ID#: [id number]   Technician:    History: This is a 67 year old female referred for evaluation of right foot numbness.  NCV & EMG Findings: Extensive electrodiagnostic testing of the right lower extremity shows:  Right sural and superficial peroneal sensory responses are within normal limits. Right peroneal motor responses within normal limits.  Right tibial motor response is absent. Right tibial H reflex study shows prolonged latency. Chronic motor axonal loss changes are seen affecting the S1 myotome, without accompanying active denervation.  Impression: Chronic S1 radiculopathy affecting the right lower extremity, moderate. There is no evidence of a large fiber sensorimotor polyneuropathy.   ___________________________ Nita Sickle, DO    Nerve Conduction Studies   Stim Site NR Peak (ms) Norm Peak (ms) O-P Amp (V) Norm O-P Amp  Right Sup Peroneal Anti Sensory (Ant Lat Mall)  32 C  12 cm    2.8 <4.6 6.8 >3  Right Sural Anti Sensory (Lat Mall)  32 C  Calf    2.7 <4.6 15.3 >3     Stim Site NR Onset (ms) Norm Onset (ms) O-P Amp (mV) Norm O-P Amp Site1 Site2 Delta-0 (ms) Dist (cm) Vel (m/s) Norm Vel (m/s)  Right Peroneal Motor (Ext Dig Brev)  32 C  Ankle    4.6 <6.0 3.3 >2.5 B Fib Ankle 8.0 35.0 44 >40  B Fib    12.6  2.8  Poplt B Fib 1.8 9.0 50 >40  Poplt    14.4  2.4         Right Tibial Motor (Abd Hall Brev)  32 C  Ankle *NR  <6.0  >4 Knee Ankle  0.0  >40  Knee *NR             Electromyography   Side Muscle Ins.Act Fibs Fasc Recrt Amp Dur Poly Activation Comment  Right AntTibialis Nml Nml Nml Nml Nml Nml Nml Nml N/A  Right Gastroc Nml Nml Nml *1- *1+ *1+ *1+ Nml N/A  Right Flex Dig Long Nml Nml Nml  Nml Nml Nml Nml Nml N/A  Right RectFemoris Nml Nml Nml Nml Nml Nml Nml Nml N/A  Right BicepsFemS Nml Nml Nml *1- *1+ *1+ *1+ Nml N/A  Right GluteusMed Nml Nml Nml Nml Nml Nml Nml Nml N/A      Waveforms:

## 2023-03-28 NOTE — Telephone Encounter (Signed)
Pharmacy Patient Advocate Encounter   Received notification from CoverMyMeds that prior authorization for HUMIRA 40MG  is required/requested.   Insurance verification completed.   The patient is insured through [facility] .   Per test claim: PA required; PA submitted to above mentioned insurance via CoverMyMeds Key/confirmation #/EOC [id number] Status is pending

## 2023-03-31 ENCOUNTER — Telehealth: Payer: Self-pay | Admitting: Gastroenterology

## 2023-03-31 NOTE — Telephone Encounter (Signed)
With insurance, PT can no longer get Humira pen with [facility]. She needs a new script called in to [facility] [phone]

## 2023-04-01 LAB — BASIC METABOLIC PANEL
BUN/Creatinine Ratio: 23 (ref 12–28)
BUN: 21 mg/dL (ref 8–27)
CO2: 22 mmol/L (ref 20–29)
Calcium: 8.8 mg/dL (ref 8.7–10.3)
Chloride: 105 mmol/L (ref 96–106)
Creatinine, Ser: 0.91 mg/dL (ref 0.57–1.00)
Glucose: 91 mg/dL (ref 70–99)
Potassium: 4.5 mmol/L (ref 3.5–5.2)
Sodium: 143 mmol/L (ref 134–144)
eGFR: 69 mL/min/{1.73_m2} (ref >59)

## 2023-04-01 MED ORDER — HUMIRA (2 PEN) 80 MG/0.8ML ~~LOC~~ AJKT: 80.0000 mg | AUTO-INJECTOR | SUBCUTANEOUS | 5 refills | Status: DC

## 2023-04-01 NOTE — Telephone Encounter (Signed)
Rx has been sent to [facility] as requested.

## 2023-04-11 ENCOUNTER — Other Ambulatory Visit: Payer: Self-pay | Admitting: Cardiovascular Disease

## 2023-04-11 DIAGNOSIS — I2510 Atherosclerotic heart disease of native coronary artery without angina pectoris: Secondary | ICD-10-CM

## 2023-04-11 DIAGNOSIS — E782 Mixed hyperlipidemia: Secondary | ICD-10-CM

## 2023-05-02 ENCOUNTER — Telehealth: Payer: Self-pay | Admitting: Gastroenterology

## 2023-05-02 NOTE — Telephone Encounter (Signed)
PT is calling to get a refill on Humira sent to [facility] but it needs to have a PA. She has a FU appointment scheduled for 06/19/23 but she will run out before that appointment. Please advise.

## 2023-05-05 MED ORDER — AMJEVITA 80 MG/0.8ML ~~LOC~~ SOAJ: 1.0000 | SUBCUTANEOUS | 5 refills | Status: DC

## 2023-05-05 NOTE — Telephone Encounter (Signed)
Called [facility] prior authorization team and spoke with Artemus. Obie Dredge informed me that patient's insurance will only cover Amjevita 80 mg/0.8 ml, quantity of 1.6 ml every 28 days, NDC: [id number]. This prescription will automatically attach to approved PA on file, valid from 03/28/23-09/26/23.   Dr. Adela Lank, I will need to send in a new RX. Please advise if OK for change to Humira bio similar as requested per patient's insurance. Thanks

## 2023-05-05 NOTE — Telephone Encounter (Signed)
RX for Amjevita sent to [facility]. Called and notified patient of prescription change. Pt is aware that medication is at the same dose of 80 mg every 2 weeks. Pt verbalized understanding and had no concerns at the end of the call.

## 2023-05-05 NOTE — Telephone Encounter (Signed)
Yes okay to switch to Amjevita at same dosing as her Humira previously. Thanks

## 2023-05-20 ENCOUNTER — Telehealth: Payer: Self-pay | Admitting: Gastroenterology

## 2023-05-20 ENCOUNTER — Ambulatory Visit (INDEPENDENT_AMBULATORY_CARE_PROVIDER_SITE_OTHER): Payer: 59 | Admitting: Gastroenterology

## 2023-05-20 ENCOUNTER — Other Ambulatory Visit (INDEPENDENT_AMBULATORY_CARE_PROVIDER_SITE_OTHER): Payer: 59

## 2023-05-20 ENCOUNTER — Encounter: Payer: Self-pay | Admitting: Gastroenterology

## 2023-05-20 VITALS — BP 124/80 | HR 75 | Ht 67.0 in | Wt 219.0 lb

## 2023-05-20 DIAGNOSIS — R197 Diarrhea, unspecified: Secondary | ICD-10-CM

## 2023-05-20 DIAGNOSIS — Z79899 Other long term (current) drug therapy: Secondary | ICD-10-CM

## 2023-05-20 DIAGNOSIS — K5080 Crohn's disease of both small and large intestine without complications: Secondary | ICD-10-CM

## 2023-05-20 DIAGNOSIS — Z8619 Personal history of other infectious and parasitic diseases: Secondary | ICD-10-CM | POA: Diagnosis not present

## 2023-05-20 LAB — COMPREHENSIVE METABOLIC PANEL
ALT: 16 U/L (ref 0–35)
AST: 20 U/L (ref 0–37)
Albumin: 3.8 g/dL (ref 3.5–5.2)
Alkaline Phosphatase: 55 U/L (ref 39–117)
BUN: 16 mg/dL (ref 6–23)
CO2: 26 meq/L (ref 19–32)
Calcium: 8.9 mg/dL (ref 8.4–10.5)
Chloride: 104 meq/L (ref 96–112)
Creatinine, Ser: 0.81 mg/dL (ref 0.40–1.20)
GFR: 74.49 mL/min (ref >60.00)
Glucose, Bld: 93 mg/dL (ref 70–99)
Potassium: 4.2 meq/L (ref 3.5–5.1)
Sodium: 138 meq/L (ref 135–145)
Total Bilirubin: 0.3 mg/dL (ref 0.2–1.2)
Total Protein: 7 g/dL (ref 6.0–8.3)

## 2023-05-20 LAB — CBC WITH DIFFERENTIAL/PLATELET
Basophils Absolute: 0.1 10*3/uL (ref 0.0–0.1)
Basophils Relative: 1.3 % (ref 0.0–3.0)
Eosinophils Absolute: 0.2 10*3/uL (ref 0.0–0.7)
Eosinophils Relative: 2.8 % (ref 0.0–5.0)
HCT: 37.4 % (ref 36.0–46.0)
Hemoglobin: 12.3 g/dL (ref 12.0–15.0)
Lymphocytes Relative: 25.3 % (ref 12.0–46.0)
Lymphs Abs: 1.6 10*3/uL (ref 0.7–4.0)
MCHC: 33 g/dL (ref 30.0–36.0)
MCV: 96.2 fL (ref 78.0–100.0)
Monocytes Absolute: 0.5 10*3/uL (ref 0.1–1.0)
Monocytes Relative: 8.8 % (ref 3.0–12.0)
Neutro Abs: 3.8 10*3/uL (ref 1.4–7.7)
Neutrophils Relative %: 61.8 % (ref 43.0–77.0)
Platelets: 291 10*3/uL (ref 150.0–400.0)
RBC: 3.89 Mil/uL (ref 3.87–5.11)
RDW: 13.9 % (ref 11.5–15.5)
WBC: 6.1 10*3/uL (ref 4.0–10.5)

## 2023-05-20 MED ORDER — DICYCLOMINE HCL 10 MG PO CAPS: 10.0000 mg | ORAL_CAPSULE | Freq: Three times a day (TID) | ORAL | 1 refills | Status: DC | PRN

## 2023-05-20 NOTE — Telephone Encounter (Signed)
Okay got it

## 2023-05-20 NOTE — Telephone Encounter (Signed)
Inbound call from patient, stating she would like to speak to a nurse in regards to an appointment that she has 3/6. She states she believes she is having a chron Flare up and would like to get seen sooner or receive advice from a nurse.

## 2023-05-20 NOTE — Telephone Encounter (Signed)
 Called and spoke with patient regarding symptoms. Pt states that she received an epidural injection on 1/24 and symptoms started on 1/25. Diarrhea and urgency to have a BM. Pt states that she doesn't know how many stools she is having in a day because she is continuously having to have a BM. Pt reports mucous in the stool, but no blood. Pt has increased abdominal pain, especially when she needs to have a BM. Pt will be due for Amjevita  injection later this week. Pt does not typically have these symptoms in between injections. Pt states that something similar happened a couple of months ago, but symptoms resolved after a few days. Symptoms have persisted at this time. Denies any recent abx use. Pt has been scheduled to come in this afternoon to discuss further.

## 2023-05-20 NOTE — Patient Instructions (Signed)
 We have sent the following medications to your pharmacy for you to pick up at your convenience: Bentyl  10mg  every 8 hours as needed  Your provider has requested that you go to the basement level for lab work before leaving today. Press B on the elevator. The lab is located at the first door on the left as you exit the elevator.  _______________________________________________________  If your blood pressure at your visit was 140/90 or greater, please contact your primary care physician to follow up on this.  _______________________________________________________  If you are age 26 or older, your body mass index should be between 23-30. Your Body mass index is 34.3 kg/m. If this is out of the aforementioned range listed, please consider follow up with your Primary Care Provider.  If you are age 51 or younger, your body mass index should be between 19-25. Your Body mass index is 34.3 kg/m. If this is out of the aformentioned range listed, please consider follow up with your Primary Care Provider.   ________________________________________________________  The [facility] providers would like to encourage you to use MYCHART to communicate with providers for non-urgent requests or questions.  Due to long hold times on the telephone, sending your provider a message by [facility] may be a faster and more efficient way to get a response.  Please allow 48 business hours for a response.  Please remember that this is for non-urgent requests.  _______________________________________________________  Thank you for entrusting me with your care and for choosing [facility], Dr. Elspeth Naval

## 2023-05-20 NOTE — Progress Notes (Signed)
 HPI :  68 y/o female here for follow up visit for Crohn's disease.   Crohn's history: Ileocolonic Crohn's Previously followed with Dr. Teressa. Dx with Crohn's disease in 2009.  Historically had been on some azathioprine  back in 2014, she had erythema nodosum, ultimately transitioned to Humira  when she had active disease on thiopurine monotherapy, in August 2018. On Humira  monotherapy since that time with generally good control of her symptoms.  Her index symptoms of Crohn's are loose stools and abdominal pain.  She has had a history of C. difficile in the past.  She tested negative for C. difficile with an elevated fecal calprotectin to 445 back in February a few years ago.  Dr. Teressa checked her Humira  levels and her level was subtherapeutic without antibodies.  Her dose was increased to 80 mg every 2 weeks. Rechecked her Humira  levels  on higher dosing - levels therapeutic at 15 without any antibodies.   SINCE LAST VISIT:  She is here today for an acute visit.  She is not feeling well.  She has had problems with low back pain and neuropathy in her feet.  She had an epidural steroid injection on January 24.  She states on the 25th she feels as though she has had a flare of her Crohn's symptoms.  By this she means she has had persistent loose stools, several times daily, including nocturnal symptoms.  She is also had significant abdominal cramping and abdominal pain in her lower abdomen.  She has not been having any vomiting, she is tolerating p.o.  She has been on Humira  80 mg every 14 days.  She was actually just switched to the biosimilar which she is supposed to start this weekend, she has not started that yet.  Recall she does have a history of C. difficile.  She has not been on any antibiotics lately.  She denies any sick contacts.  She feels this is her worst flare since she has been on the higher dose of Humira .  Recall she just recently had a colonoscopy with me in September which showed  no active inflammation, at least in her colon.  She has had a large cecal polyp removed in piecemeal this past March.  Location difficulty access behind the IC valve.  She had some residual polyp tissue noted there on her last exam in September which was treated with cold snare and avulsion forceps.  I had recommended another colonoscopy in the next 6 to 12 months.  She is due for basic lab work, due for QuantiFERON gold.  She has had an RSV vaccine, COVID-vaccine, Pneumovax, Shingrix, all up-to-date and immunizations tab.       Review of pertinent gastrointestinal problems: 1.Crohn's ileocolitis: Workup at [facility] IBD Serology 7 testing 02/2008 pattern consistent with IBD.SABRASABRACrohn's. Colonoscopy 01/2008 showed terminal ileum and right colon ulcerations, pathology c/w acute and chronic ileitis, colitis, also HP polyps. Colonoscopy Path report from 2006 showed HP polyps. Office note 05/2009 documented previous C. diff colitis (treated empirically and also after toxin +). 2012 doing well on oral mesalamine  3.6 gms/day. ? Worsening of disease 03/2013, c. Diff PCR neg; increased mesalamine  to 4.8gm daily. Colonoscopy 04/2012 found essentially normal colon but moderate to severe ileitis (path confirmed acute and chronic inflammation), started on 200mg  azathiaprine daily Had erythema nodosum 2014, early, improved with steroids. 11/2012 doing very well on 200 azathiaprine daily. 04/2013: urgency, straining: felt to be more functional then IBD related; fiber supplements stopped.  12/2013 doing well on  200 azathiaprine daily. 03/2015 MRI enterograophy mild wall thickening in TI and cecum...active crohn's disease, elevated inflammatory markers and so azathiaprine dose increased to 250mg  daily; Decreased back to 200mg  daily due to blood counts, LFTs. 05/2021 Humira  dosing increased to 80 mg every other week     Humira  06/01/21 - level of 4.2, undetectable antibody   Fecal calprotectin 05/23/21 - 445     Colonoscopy 06/28/22: - The perianal and digital rectal examinations were normal. - The terminal ileum appeared normal. - A roughly 20 mm polyp was found in the cecum. The polyp was sessile and located just behind the IC valve, difficult to visualize en bloc. The polyp was removed with a piecemeal technique using a cold snare. Resection and retrieval were complete. - Multiple medium-mouthed diverticula were found in the sigmoid colon. - Anal papilla(e) were hypertrophied. 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 - The colon was quite tortous. The exam was otherwise without abnormality. - Biopsies were taken with a cold forceps for histology in the right, left, and transverse colon.   1. Surgical [P], colon, cecum, polyp (1) - SESSILE SERRATED POLYP(S) WITHOUT CYTOLOGIC DYSPLASIA - MELANOSIS COLI 2. Surgical [P], right colon bx's - COLONIC MUCOSA WITH NO SPECIFIC HISTOPATHOLOGIC CHANGES - NEGATIVE FOR ACUTE INFLAMMATION, FEATURES OF CHRONICITY, GRANULOMAS OR DYSPLASIA - MELANOSIS COLI 3. Surgical [P], colon, transverse bx's - COLONIC MUCOSA WITH NO SPECIFIC HISTOPATHOLOGIC CHANGES - NEGATIVE FOR ACUTE INFLAMMATION, FEATURES OF CHRONICITY, GRANULOMAS OR DYSPLASIA - MELANOSIS COLI 4. Surgical [P], left colon bx's - COLONIC MUCOSA WITH NO SPECIFIC HISTOPATHOLOGIC CHANGES - NEGATIVE FOR ACUTE INFLAMMATION, FEATURES OF CHRONICITY, GRANULOMAS OR DYSPLASIA - MELANOSIS COLI   Colonoscopy 12/30/22: - The perianal and digital rectal examinations were normal. - A large post polypectomy scar was found in the cecum. It was quite challenging to see the entire scar given it's location behind the IC valve. Initially this was a challenging polypectomy due to this reason. The IC valve was pleated back with snare tip - some subtle possible flat polypoid mucosa was noted in one portion along the scar. Unclear if this is normal granulation tissue or flat polypoid tissue. Cold snare was used and able  to remove most of this area / scar. Cold forceps avulsion technique then used to remove some small areas of flat mucosa not able to be removed with snare. Following treatment the site did not appear to show any residual tissue at the scar. - A diffuse area of mild melanosis was found in the entire colon. - Multiple small-mouthed diverticula were found in the left colon. - Anal papilla(e) were hypertrophied. - The exam was otherwise without abnormality. Of note, residual stool noted throughout the colon. Extensive lavage done in the right colon which was most affected, to achieve adequate views.  FINAL DIAGNOSIS        1. Surgical [P], colon, cecum polyp/prior polypectomy scar site, polyp (1) :       SESSILE SERRATED POLYP (S) WITHOUT CYTOLOGIC DYSPLASIA.  [name] Past Medical History:  Diagnosis Date   Chronic left shoulder pain 06/07/2022   Crohn's disease (HCC)    Dental crowns present    Encounter for annual physical exam 11/13/2022   GERD (gastroesophageal reflux disease)    High cholesterol    History of hiatal hernia    HTN (hypertension) 04/27/2018   Hyperlipidemia 01/13/2014   Osteoarthritis of knee, unilateral 09/2014   right    Patellofemoral arthritis of right knee 10/07/2014  Seasonal allergies      Past Surgical History:  Procedure Laterality Date   APPENDECTOMY     CESAREAN SECTION     x 3   CHOLECYSTECTOMY     COLONOSCOPY     ESOPHAGOGASTRODUODENOSCOPY (EGD) WITH ESOPHAGEAL DILATION     x 2   JOINT REPLACEMENT  07/11/2022   Shoulder replacement   PATELLA-FEMORAL ARTHROPLASTY Right 10/07/2014   Procedure: RIGHT PATELLA-FEMORAL ARTHROPLASTY;  Surgeon: Fonda Olmsted, MD;  Location: [facility];  Service: Orthopedics;  Laterality: Right;   SHOULDER SURGERY Left    TONSILLECTOMY AND ADENOIDECTOMY     TUBAL LIGATION  O8/13/1991   WISDOM TOOTH EXTRACTION     Family History  Problem Relation Age of Onset   Arthritis Mother    Dementia Mother    Heart  disease Father 85   Hyperlipidemia Father    Hypertension Father    Heart attack Father    Heart disease Brother 8       MI   Parkinson's disease Brother    Stroke Maternal Grandfather    Asthma Son    Colon cancer Neg Hx    Esophageal cancer Neg Hx    Pancreatic cancer Neg Hx    Stomach cancer Neg Hx    Liver disease Neg Hx    Rectal cancer Neg Hx    Social History   Tobacco Use   Smoking status: Never   Smokeless tobacco: Never  Vaping Use   Vaping status: Never Used  Substance Use Topics   Alcohol use: Yes    Alcohol/week: 4.0 standard drinks of alcohol    Types: 4 Glasses of wine per week    Comment: weekends   Drug use: No   Current Outpatient Medications  Medication Sig Dispense Refill   Adalimumab -atto (AMJEVITA ) 80 MG/0.8ML SOAJ Inject 1 pen  into the skin every 14 (fourteen) days. 1.6 mL 5   amoxicillin  (AMOXIL ) 500 MG tablet TAKE 4 TABLETS BY MOUTH 1 HOUR BEFORE DENTAL WORK     CALCIUM  CITRATE PO Take 1 tablet by mouth 2 (two) times daily.     Cholecalciferol (VITAMIN D -3 PO) Take 1 capsule by mouth at bedtime.     Cyanocobalamin (VITAMIN B-12 PO) Take 1 tablet by mouth daily.     cyclobenzaprine (FLEXERIL) 5 MG tablet Take 5 mg by mouth at bedtime as needed.     ezetimibe  (ZETIA ) 10 MG tablet Take 1 tablet (10 mg total) by mouth daily. 90 tablet 3   fexofenadine (ALLEGRA) 180 MG tablet Take 180 mg by mouth daily.     Iron-Folic Acid-Vit B12 (IRON FORMULA PO) Take by mouth.     levocetirizine (XYZAL ) 5 MG tablet Take 5 mg by mouth every evening.     montelukast  (SINGULAIR ) 10 MG tablet Take 10 mg by mouth at bedtime.     Multiple Vitamin (MULITIVITAMIN WITH MINERALS) TABS Take 1 tablet by mouth 2 (two) times daily.     Omega-3 Fatty Acids (FISH OIL PO) Take 2 capsules by mouth 2 (two) times daily.      rosuvastatin  (CRESTOR ) 20 MG tablet TAKE 1 TABLET(20 MG) BY MOUTH DAILY 90 tablet 3   valsartan  (DIOVAN ) 80 MG tablet Take 1 tablet (80 mg total) by mouth  daily. 90 tablet 1   No current facility-administered medications for this visit.   No Known Allergies   Review of Systems: All systems reviewed and negative except where noted in HPI.    Physical Exam: BP  124/80   Pulse 75   Ht 5' 7 (1.702 m)   Wt 219 lb (99.3 kg)   BMI 34.30 kg/m  Constitutional: Pleasant,well-developed, female in no acute distress. Abdominal: Soft, nondistended, nontender. . There are no masses palpable. No hepatomegaly. Extremities: no edema Neurological: Alert and oriented to person place and time. Psychiatric: Normal mood and affect. Behavior is normal.   ASSESSMENT: 68 y.o. female here for assessment of the following  1. Crohn's disease of both small and large intestine without complication (HCC)   2. High risk medication use   3. Diarrhea, unspecified type    Recent symptoms appear consistent with her index Crohn's symptoms.  She does not feel she is well-controlled on current dosing of Humira .  We discussed options.  She does have a history of C. difficile and I do think we need to rule that out as a cause of recurrent symptoms.  We were able to perform a Diatherix C. difficile stool swab in the office today and hopefully get that back within the next 24 to 48 hours.  I also asked her to go to the lab for fecal calprotectin and basic labs which she is due.  Also will screen for tuberculosis.  If her C. difficile is negative, fecal calprotectin elevated, anticipate placing her on a steroid taper to get better control of symptoms.  Given this has occurred on high dosing of Humira , if a true flare breaking through this then I think we may need to switch to a different biologic.  We spent several minutes discussing her options.  We discussed if she wanted an injection, or infusion, or an oral regimen.  She is a candidate for Rinvoq if she wanted an oral agent, or if she prefers infrequent subcu dosing we could consider Skyrizi.  We discussed risks of each  option.  If she wanted to minimize her risk she could try Entyvio, understanding this may not be as strong as other regimens.  Following discussion her preference would be to start Skyrizi if covered by insurance.  I will await her C. difficile and labs first before making any decisions.  If she has C. difficile we will treat her for that and would not need to necessarily switch her biologic therapy in that circumstance.  She agrees with the plan as outlined.  PLAN: - Diatherix C diff stool swab - done in office today - lab for CBC, CMET, quantiferon gold - lab stool for fecal calprotectin - likely start prednisone  in the next 24-48 hours - start bentyl  10mg  every 8 hours PRN in the interim - assuming her symptoms likely represent a Crohn's flare, we may likely need to switch biologics - likely skyrizi following discussion of options if this is approved by her insurance.  Marcey Naval, MD [facility]

## 2023-05-21 ENCOUNTER — Other Ambulatory Visit: Payer: 59

## 2023-05-21 DIAGNOSIS — R197 Diarrhea, unspecified: Secondary | ICD-10-CM

## 2023-05-21 DIAGNOSIS — K5080 Crohn's disease of both small and large intestine without complications: Secondary | ICD-10-CM

## 2023-05-21 DIAGNOSIS — Z79899 Other long term (current) drug therapy: Secondary | ICD-10-CM

## 2023-05-23 ENCOUNTER — Encounter: Payer: Self-pay | Admitting: Gastroenterology

## 2023-05-23 ENCOUNTER — Telehealth: Payer: Self-pay

## 2023-05-23 LAB — QUANTIFERON-TB GOLD PLUS
Mitogen-NIL: >10 [IU]/mL
NIL: 0.03 [IU]/mL
QuantiFERON-TB Gold Plus: NEGATIVE
TB1-NIL: 0 [IU]/mL
TB2-NIL: 0 [IU]/mL

## 2023-05-23 LAB — CALPROTECTIN, FECAL: Calprotectin, Fecal: 79 ug/g (ref 0–120)

## 2023-05-23 NOTE — Telephone Encounter (Signed)
 See attached Diatherix results. Thanks

## 2023-05-23 NOTE — Telephone Encounter (Signed)
 Thank you [place].  Can you touch base with this patient on Monday to see how she is feeling? C. difficile negative.  Fecal calprotectin normal.  The rest of her labs are normal.  This would all argue against Crohn's being active causing her symptoms.  I had placed her on some dicyclomine  at the office visit and I hope this helps her.  If you can update me on how she is feeling then I can make recommendations on where to proceed from here.  Thanks

## 2023-05-26 NOTE — Telephone Encounter (Signed)
 Okay great, thank you for the follow up. Appreciate you calling her.

## 2023-05-26 NOTE — Telephone Encounter (Signed)
 Called and spoke with patient. Pt reports that she is doing much better at this time, she is having less stools throughout the day and abdominal pain is controlled with Dicyclomine . Pt states that her symptoms started to improve before her Amjevita  injection this weekend. Patient was advised to let us  know if she had any new concerns in the future. Pt verbalized understanding and had no concerns at the end of the call.

## 2023-05-26 NOTE — Telephone Encounter (Signed)
 This has been addressed. See 05/23/23 telephone encounter for details.

## 2023-06-10 ENCOUNTER — Other Ambulatory Visit: Payer: Self-pay | Admitting: *Deleted

## 2023-06-10 ENCOUNTER — Ambulatory Visit: Payer: 59 | Attending: Physician Assistant | Admitting: Emergency Medicine

## 2023-06-10 ENCOUNTER — Encounter: Payer: Self-pay | Admitting: Emergency Medicine

## 2023-06-10 VITALS — BP 118/72 | HR 84 | Ht 67.0 in | Wt 225.2 lb

## 2023-06-10 DIAGNOSIS — E782 Mixed hyperlipidemia: Secondary | ICD-10-CM

## 2023-06-10 DIAGNOSIS — K50819 Crohn's disease of both small and large intestine with unspecified complications: Secondary | ICD-10-CM | POA: Diagnosis not present

## 2023-06-10 DIAGNOSIS — I2510 Atherosclerotic heart disease of native coronary artery without angina pectoris: Secondary | ICD-10-CM | POA: Diagnosis not present

## 2023-06-10 DIAGNOSIS — I10 Essential (primary) hypertension: Secondary | ICD-10-CM | POA: Diagnosis not present

## 2023-06-10 NOTE — Progress Notes (Signed)
 Cardiology Office Note:    Date:  06/10/2023  ID:  Connie, Porter [birth year], MRN [id number] PCP: Tollie Eth, NP  [facility] Providers Cardiologist:  Kristeen Miss, MD       Patient Profile:      Connie Porter is a 68 y.o. female with visit-pertinent history of mild coronary artery disease, hyperlipidemia, hypertension, Crohns disease   She established care with cardiology service on 12/2020 for chest pain.  Coronary CTA 12/2020 showed coronary calcium score of 5 (54th percentile), borderline ascending aorta dilation measuring 39 mm.  CT over read showed moderate to large hiatal hernia.  She was last seen in clinic on 03/17/2023 for 1 year follow-up.  Her blood pressure was noted to be elevated at 146/100.  She was noted to be an active for the past 2 months due to right knee injury.  Valsartan 80 mg a day was added to medication regimen.  She was to follow-up in 3 months for BP recheck.      History of Present Illness:  Discussed the use of AI scribe software for clinical note transcription with the patient, who gave verbal consent to proceed.  Connie Porter is a 68 y.o. female who returns for 53-month follow-up for blood pressure check.  The patient presents today by herself and is without any cardiovascular concerns or complaints at this time.  She is going to [place] this coming weekend with her friends.  She was last seen in December, at which time her blood pressure was elevated.  She has not been taking her blood pressure consistently at home, however has been controlled at recent doctors visits.  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 The patient reports that the numbness persists, describing it as a 'weird numbness' that is tender and associated with shooting pains. She is followed by neurosurgery  and has been advised that nerve healing could take six to nine months or longer.  The patient also reports that she has been less active due to the leg pain, which has impacted her ability to exercise. She was previously walking four miles a day, but is now considering alternatives such as chair yoga or using a stationary bike at the gym. 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  She is without any chest pain, exertional angina, dyspnea, lightheadedness, dizziness, edema.    Review of Systems  Constitutional: Negative for weight gain and weight loss.  Cardiovascular:  Negative for chest pain, claudication, dyspnea on exertion, irregular heartbeat, leg swelling, near-syncope, orthopnea, palpitations, paroxysmal nocturnal dyspnea and syncope.  Respiratory:  Negative for shortness of breath.   Neurological:  Negative for dizziness and light-headedness.     See HPI     Home Medications:    Prior to Admission medications   Medication Sig Start Date End Date Taking? Authorizing Provider  Adalimumab-atto (AMJEVITA) 80 MG/0.8ML SOAJ Inject 1 pen  into the skin every 14 (fourteen) days. 05/05/23   Armbruster, Willaim Rayas, MD  amoxicillin (AMOXIL) 500 MG tablet TAKE 4 TABLETS BY MOUTH 1 HOUR BEFORE DENTAL WORK    [provider]  CALCIUM CITRATE PO Take 1 tablet by mouth 2 (two) times daily.    [provider]  Cholecalciferol (VITAMIN D-3  PO) Take 1 capsule by mouth at bedtime.    [provider]  Cyanocobalamin (VITAMIN B-12 PO) Take 1 tablet by mouth daily.    [provider]  cyclobenzaprine (FLEXERIL) 5 MG tablet Take 5 mg by mouth at bedtime as needed. 02/20/23   [provider]  dicyclomine (BENTYL) 10 MG capsule Take 1 capsule (10 mg total) by mouth every 8 (eight) hours as needed for spasms. 05/20/23   Armbruster, Willaim Rayas, MD  ezetimibe (ZETIA) 10 MG tablet Take 1 tablet (10  mg total) by mouth daily. 03/18/23 03/12/24  Nahser, Deloris Ping, MD  fexofenadine (ALLEGRA) 180 MG tablet Take 180 mg by mouth daily.    [provider]  Iron-Folic Acid-Vit B12 (IRON FORMULA PO) Take by mouth.    [provider]  levocetirizine (XYZAL) 5 MG tablet Take 5 mg by mouth every evening.    [provider]  montelukast (SINGULAIR) 10 MG tablet Take 10 mg by mouth at bedtime.    [provider]  Multiple Vitamin (MULITIVITAMIN WITH MINERALS) TABS Take 1 tablet by mouth 2 (two) times daily.    [provider]  Omega-3 Fatty Acids (FISH OIL PO) Take 2 capsules by mouth 2 (two) times daily.     [provider]  rosuvastatin (CRESTOR) 20 MG tablet TAKE 1 TABLET(20 MG) BY MOUTH DAILY 04/11/23   Nahser, Deloris Ping, MD  valsartan (DIOVAN) 80 MG tablet Take 1 tablet (80 mg total) by mouth daily. 03/17/23   Nahser, Deloris Ping, MD   Studies Reviewed:       Coronary CTA 12/22/2020 1. Minimal CAD, CADRADS = 1.   2. Coronary calcium score is 5, which places the patient in the 54th percentile for age and sex matched control.   3. Normal coronary origins with right dominance.   4. Borderline ascending aorta dilation, 39 mm at mid ascending aorta. Risk Assessment/Calculations:             Physical Exam:   VS:  BP 118/72   Pulse 84   Ht 5\' 7"  (1.702 m)   Wt 225 lb 3.2 oz (102.2 kg)   SpO2 97%   BMI 35.27 kg/m    Wt Readings from Last 3 Encounters:  06/10/23 225 lb 3.2 oz (102.2 kg)  05/20/23 219 lb (99.3 kg)  03/17/23 206 lb 9.6 oz (93.7 kg)    Constitutional:      Appearance: Normal and healthy appearance. Not in distress.  HENT:     Head: Normocephalic.  Neck:     Vascular: JVD normal.  Pulmonary:     Effort: Pulmonary effort is normal.     Breath sounds: Normal breath sounds.  Chest:     Chest wall: Not tender to palpatation.  Cardiovascular:     PMI at left midclavicular line. Normal rate. Regular rhythm. Normal S1. Normal  S2.      Murmurs: There is no murmur.     No gallop.  No click. No rub.  Pulses:    Intact distal pulses.  Edema:    Peripheral edema absent.  Musculoskeletal: Normal range of motion.     Cervical back: Normal range of motion and neck supple. Skin:    General: Skin is warm and dry.  Neurological:     General: No focal deficit present.     Mental Status: Alert, oriented to person, place, and time and oriented to person, place and time.  Psychiatric:  Mood and Affect: Mood and affect normal.        Behavior: Behavior is cooperative.        Thought Content: Thought content normal.        Assessment and Plan:  Hypertension Blood pressure today 118/72.  Currently under excellent control -Encouraged to begin taking BP at home -Continue valsartan 80 mg daily  Coronary artery disease / Hyperlipidemia Coronary CTA 12/2020 showed coronary calcium score of 5 (54th percentile) LDL 27, HDL 62, TC 104 on 12/8117.  LDL under good control and less than her goal of 70. -Today she is without any anginal symptoms, no indication for ischemic evaluation at this time -Continue rosuvastatin 20 mg daily, ezetimibe 10 mg daily -Ordered lipid panel today  Crohn's disease Managed by gastroenterology           Dispo: Follow-up for your yearly visit in December 2025.  Her primary cardiologist is retiring.  Signed, Denyce Robert, NP

## 2023-06-10 NOTE — Patient Instructions (Signed)
 Medication Instructions:  Your physician recommends that you continue on your current medications as directed. Please refer to the Current Medication list given to you today.  *If you need a refill on your cardiac medications before your next appointment, please call your pharmacy*   Lab Work: GO TO [facility] NEAR YOU, FASTING:  LIPID  [facility] locations:   [facility] - [address] ([address] [place] [facility] office) - [address] ([facility]) - [address] [phone]    [facility] [address]    [place]  - [address]    [place] - [address] - [address] [facility]  - [address] Chief Technology Officer)  If you have labs (blood work) drawn today and your tests are completely normal, you will receive your results only by: [employer] (if you have MyChart) OR A paper copy in the mail If you have any lab test that is abnormal or we need to change your treatment, we will call you to review the results.   Testing/Procedures: None ordered   Follow-Up: At [facility], you and your health needs are our priority.  As part of our continuing mission to provide you with exceptional heart care, we have created designated Provider Care Teams.  These Care Teams include your primary Cardiologist (physician) and Advanced Practice Providers (APPs -  Physician Assistants and Nurse Practitioners) who all work together to provide you with the care you need, when you need it.  We recommend signing up for the patient portal called "MyChart".  Sign up information is provided on this After Visit Summary.  MyChart is used to connect with patients for Virtual Visits (Telemedicine).  Patients are able to view lab/test results, encounter notes, upcoming appointments, etc.  Non-urgent messages can be sent to your provider as  well.   To learn more about what you can do with MyChart, go to [email or web address].    Your next appointment:   10 month(s)  Provider:   Kristeen Miss, MD  or  Rise Paganini, NP          Other Instructions     1st Floor: - Lobby - Registration  - Pharmacy  - Lab - Cafe  2nd Floor: - PV Lab - Diagnostic Testing (echo, CT, nuclear med)  3rd Floor: - Vacant  4th Floor: - TCTS (cardiothoracic surgery) - AFib Clinic - Structural Heart Clinic - Vascular Surgery  - Vascular Ultrasound  5th Floor: - [facility] (general and EP) - Clinical Pharmacy for coumadin, hypertension, lipid, weight-loss medications, and med management appointments    Valet parking services will be available as well.

## 2023-06-12 LAB — LIPID PANEL
Chol/HDL Ratio: 1.8 ratio (ref 0.0–4.4)
Cholesterol, Total: 185 mg/dL (ref 100–199)
HDL: 105 mg/dL (ref >39)
LDL Chol Calc (NIH): 66 mg/dL (ref 0–99)
Triglycerides: 78 mg/dL (ref 0–149)
VLDL Cholesterol Cal: 14 mg/dL (ref 5–40)

## 2023-06-13 ENCOUNTER — Ambulatory Visit: Payer: 59 | Admitting: Neurology

## 2023-06-19 ENCOUNTER — Ambulatory Visit: Payer: Medicare Other | Admitting: Gastroenterology

## 2023-06-23 ENCOUNTER — Encounter: Payer: Self-pay | Admitting: Nurse Practitioner

## 2023-06-23 ENCOUNTER — Ambulatory Visit: Admitting: Nurse Practitioner

## 2023-06-23 VITALS — BP 132/82 | HR 64 | Resp 16 | Ht 67.0 in | Wt 229.8 lb

## 2023-06-23 DIAGNOSIS — E782 Mixed hyperlipidemia: Secondary | ICD-10-CM

## 2023-06-23 DIAGNOSIS — I10 Essential (primary) hypertension: Secondary | ICD-10-CM | POA: Diagnosis not present

## 2023-06-23 DIAGNOSIS — K50819 Crohn's disease of both small and large intestine with unspecified complications: Secondary | ICD-10-CM | POA: Diagnosis not present

## 2023-06-23 DIAGNOSIS — S8401XA Injury of tibial nerve at lower leg level, right leg, initial encounter: Secondary | ICD-10-CM

## 2023-06-23 DIAGNOSIS — E66812 Obesity, class 2: Secondary | ICD-10-CM | POA: Insufficient documentation

## 2023-06-23 DIAGNOSIS — S8400XA Injury of tibial nerve at lower leg level, unspecified leg, initial encounter: Secondary | ICD-10-CM | POA: Insufficient documentation

## 2023-06-23 DIAGNOSIS — Z6835 Body mass index (BMI) 35.0-35.9, adult: Secondary | ICD-10-CM

## 2023-06-23 LAB — POCT GLYCOSYLATED HEMOGLOBIN (HGB A1C): Hemoglobin A1C: 4.9 % (ref 4.0–5.6)

## 2023-06-23 MED ORDER — TIRZEPATIDE-WEIGHT MANAGEMENT 2.5 MG/0.5ML ~~LOC~~ SOLN
2.5000 mg | SUBCUTANEOUS | 0 refills | Status: DC
Start: 2023-06-23 — End: 2023-07-01

## 2023-06-23 NOTE — Patient Instructions (Addendum)
 I will send in the prescription for The Zepbound and we will see if we can get coverage for this. We will see what your average blood sugars are to see if they have been running high. If this is the case, this may help give [place] some ammunition for coverage.   If we cannot get coverage with insurance, we can consider one of the other medication options, but will need to make sure your blood pressure is very well controlled first.  We can also consider a compound version of the medication, but this is not always the same as the manufacturers version and I have seen mixed results.  The pharmacy we typically use is [facility]. You can contact them to see what the cost would be for you, if you like.   Diet and exercise are a vital part of the dietary plan with this medication. This is also a requirement for insurance coverage. We usually check in about every 3 months for progress on weight management and monitoring. We can do this virtually as long as we don't need labs.   WEIGHT LOSS PLANNING Your progress today shows:     06/23/2023    9:03 AM 06/10/2023    9:01 AM 05/20/2023    3:28 PM  Vitals with BMI  Height 5\' 7"  5\' 7"  5\' 7"   Weight 229 lbs 13 oz 225 lbs 3 oz 219 lbs  BMI 35.98 35.26 34.29  Systolic 132 118 161  Diastolic 82 72 80  Pulse 64 84 75    For best management of weight, it is vital to balance intake versus output. This means the number of calories burned per day must be less than the calories you take in with food and drink.   I recommend trying to follow a diet with the following: Calories: 1200-1500 calories per day Carbohydrates: 150-180 grams of carbohydrates per day  Why: Gives your body enough "quick fuel" for cells to maintain normal function without sending them into starvation mode.  Protein: At least 90 grams of protein per day- 30 grams with each meal Why: Protein takes longer and uses more energy than carbohydrates to break down for fuel. The  carbohydrates in your meals serves as quick energy sources and proteins help use some of that extra quick energy to break down to produce long term energy. This helps you not feel hungry as quickly and protein breakdown burns calories.  Water: Drink AT LEAST 64 ounces of water per day  Why: Water is essential to healthy metabolism. Water helps to fill the stomach and keep you fuller longer. Water is required for healthy digestion and filtering of waste in the body.  Fat: Limit fats in your diet- when choosing fats, choose foods with lower fats content such as lean meats (chicken, fish, [place]).  Why: Increased fat intake leads to storage "for later". Once you burn your carbohydrate energy, your body goes into fat and protein breakdown mode to help you loose weight.  Cholesterol: Fats and oils that are LIQUID at room temperature are best. Choose vegetable oils (olive oil, avocado oil, nuts). Avoid fats that are SOLID at room temperature (animal fats, processed meats). Healthy fats are often found in whole grains, beans, nuts, seeds, and berries.  Why: Elevated cholesterol levels lead to build up of cholesterol on the inside of your blood vessels. This will eventually cause the blood vessels to become hard and can lead to high blood pressure and damage to your organs.  When the blood flow is reduced, but the pressure is high from cholesterol buildup, parts of the cholesterol can break off and form clots that can go to the brain or heart leading to a stroke or heart attack.  Fiber: Increase amount of SOLUBLE the fiber in your diet. This helps to fill you up, lowers cholesterol, and helps with digestion. Some foods high in soluble fiber are oats, peas, beans, apples, carrots, barley, and citrus fruits.   Why: Fiber fills you up, helps remove excess cholesterol, and aids in healthy digestion which are all very important in weight management.   I recommend the following as a minimum activity routine: Purposeful  walk or other physical activity at least 20 minutes every single day. This means purposefully taking a walk, jog, bike, swim, treadmill, elliptical, dance, etc.  This activity should be ABOVE your normal daily activities, such as walking at work. Goal exercise should be at least 150 minutes a week- work your way up to this.   Heart Rate: Your maximum exercise heart rate should be 220 - Your Age in Years. When exercising, get your heart rate up, but avoid going over the maximum targeted heart rate.  60-70% of your maximum heart rate is where you tend to burn the most fat. To find this number:  220 - Age In Years= Max HR  Max HR x 0.6 (or 0.7) = Fat Burning HR The Fat Burning HR is your goal heart rate while working out to burn the most fat.  NEVER exercise to the point your feel lightheaded, weak, nauseated, dizzy. If you experience ANY of these symptoms- STOP exercise! Allow yourself to cool down and your heart rate to come down. Then restart slower next time.  If at ANY TIME you feel chest pain or chest pressure during exercise, STOP IMMEDIATELY and seek medical attention.

## 2023-06-23 NOTE — Progress Notes (Unsigned)
 Connie Clamp, DNP, AGNP-c [facility]  [address] [place], [place] [address] [phone]  ESTABLISHED PATIENT- Chronic Health and/or Follow-Up Visit  Blood pressure 132/82, pulse 64, resp. rate 16, height 5\' 7"  (1.702 m), weight 229 lb 12.8 oz (104.2 kg), SpO2 100%.    Connie Porter is a 68 y.o. year old female presenting today for evaluation and management of chronic conditions: Discussion of weight management options.    History of Present Illness Connie Porter presents with weight management concerns following a Baker's cyst and tibial nerve damage.  She developed a large Baker's cyst in her leg, causing significant swelling and preventing ambulation. An ultrasound revealed the cyst extended from the back of her knee halfway down her calf. 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 This has resulted in persistent numbness in her foot, which is worsening. She underwent a nerve conduction study and MRI of her lower back and leg. Despite medication to extract fluid and relieve symptoms, her foot remains numb.  She experiences difficulty with physical activity due to her foot condition, impacting her ability to maintain weight loss. She describes a 'numb pain' and tenderness in her foot, with a sensation that is difficult to explain. She has difficulty driving long distances and experiences swelling in her leg and foot after prolonged activity.  She previously participated in a weight loss program, which was effective until she developed the Baker's cyst. Since being unable to exercise from October, she has regained weight. She has a history of successful weight loss but is concerned about potential insulin resistance contributing to weight gain.  Her medical history includes high blood pressure, high cholesterol, and Crohn's disease, which is in remission. She is on blood pressure medication with no side effects and has had  multiple colonoscopies due to a precancerous polyp.  Her social history includes working as a Lawyer at an [employer], which makes it challenging to eat small, frequent meals. She drinks water throughout the day and occasionally consumes energy drinks before gym sessions. She prefers savory snacks like cheese and crackers over sweets.  All ROS negative with exception of what is listed above.   PHYSICAL EXAM Physical Exam Vitals and nursing note reviewed.  Constitutional:      Appearance: Normal appearance. She is obese. She is not toxic-appearing or diaphoretic.  HENT:     Head: Normocephalic.  Eyes:     Conjunctiva/sclera: Conjunctivae normal.  Neck:     Vascular: No carotid bruit.  Cardiovascular:     Rate and Rhythm: Normal rate and regular rhythm.     Pulses: Normal pulses.     Heart sounds: Normal heart sounds.  Pulmonary:     Effort: Pulmonary effort is normal.     Breath sounds: Normal breath sounds.  Abdominal:     General: Bowel sounds are normal. There is no distension.     Palpations: Abdomen is soft.     Tenderness: There is no abdominal tenderness. There is no guarding.  Musculoskeletal:     Cervical back: Neck supple. No tenderness.     Right lower leg: No edema.     Left lower leg: No edema.  Lymphadenopathy:     Cervical: No cervical adenopathy.  Skin:    General: Skin is warm and dry.     Capillary Refill: Capillary refill takes less than 2 seconds.  Neurological:     Mental Status: She is alert and oriented to person, place, and time.  Psychiatric:        Mood and Affect: Mood normal.        Behavior: Behavior normal.      PLAN Problem List Items Addressed This Visit     Crohn's disease (HCC)   The patient is in remission. She has had multiple colonoscopies due to a precancerous polyp and is scheduled for another follow-up in six months. - Continue current management for Crohn's disease - Follow up with gastroenterologist in six  months      Primary hypertension   The patient is on medication with no reported side effects. - Continue current blood pressure medication      Relevant Medications   tirzepatide (ZEPBOUND) 2.5 MG/0.5ML injection vial   Other Relevant Orders   POCT glycosylated hemoglobin (Hb A1C) (Completed)   Mixed hyperlipidemia   The patient has high cholesterol. Recent lab results were reviewed. - Continue current management for hyperlipidemia      Relevant Medications   tirzepatide (ZEPBOUND) 2.5 MG/0.5ML injection vial   Other Relevant Orders   POCT glycosylated hemoglobin (Hb A1C) (Completed)   Injury of tibial nerve   The patient has worsening numbness and pain in her foot following a Baker's cyst rupture in October. Gabapentin has been ineffective. She is scheduled to see a neurosurgeon on the 20th. Nerve regeneration can take 6-9 months, but her symptoms are worsening. - Continue gabapentin - Follow up with neurosurgeon on the 20th      Relevant Medications   tirzepatide (ZEPBOUND) 2.5 MG/0.5ML injection vial   Class 2 severe obesity due to excess calories with serious comorbidity and body mass index (BMI) of 35.0 to 35.9 in adult [facility]) - Primary   The patient has regained weight due to limited exercise following a leg issue. She is interested in weight management medications like Wegovy and Zepbound. The mechanisms, including effects on insulin resistance and appetite suppression, were explained. Her hypertension and hyperlipidemia may support insurance coverage. Alternative medications such as Qsymia and Contrave were also discussed. - Submit prior authorization for Zepbound - Check A1c - Provide information on diet and portion control - Discuss alternative medications if Zepbound is not covered - Consider referral to a compounding pharmacy if necessary       Relevant Medications   tirzepatide (ZEPBOUND) 2.5 MG/0.5ML injection vial   Other Relevant Orders   POCT glycosylated  hemoglobin (Hb A1C) (Completed)  General Health Maintenance Emphasized the importance of a balanced diet, hydration, and regular exercise. Discussed small, frequent meals and adequate protein intake to manage weight and reduce nausea associated with weight loss medications. - Encourage small, frequent meals with adequate protein - Continue regular hydration with water  Follow-up - Follow up with neurosurgeon on the 20th - Follow up with gastroenterologist in six months - Monitor insurance approval for Zepbound - Consider alternative weight loss medications if Zepbound is not covered.  Return in about 3 months (around 09/23/2023) for Virtual- weight management.  Connie Presley Gora, DNP, AGNP-c Time: 42 minutes, >50% spent counseling, care coordination, chart review, and documentation.

## 2023-06-24 ENCOUNTER — Encounter: Payer: Self-pay | Admitting: Nurse Practitioner

## 2023-06-24 DIAGNOSIS — E782 Mixed hyperlipidemia: Secondary | ICD-10-CM

## 2023-06-24 DIAGNOSIS — E66812 Obesity, class 2: Secondary | ICD-10-CM

## 2023-06-24 DIAGNOSIS — I10 Essential (primary) hypertension: Secondary | ICD-10-CM

## 2023-06-24 DIAGNOSIS — Z6835 Body mass index (BMI) 35.0-35.9, adult: Secondary | ICD-10-CM

## 2023-06-24 NOTE — Assessment & Plan Note (Signed)
 The patient has regained weight due to limited exercise following a leg issue. She is interested in weight management medications like Wegovy and Zepbound. The mechanisms, including effects on insulin resistance and appetite suppression, were explained. Her hypertension and hyperlipidemia may support insurance coverage. Alternative medications such as Qsymia and Contrave were also discussed. - Submit prior authorization for Zepbound - Check A1c - Provide information on diet and portion control - Discuss alternative medications if Zepbound is not covered - Consider referral to a compounding pharmacy if necessary

## 2023-06-24 NOTE — Assessment & Plan Note (Signed)
 The patient has worsening numbness and pain in her foot following a Baker's cyst rupture in October. Gabapentin has been ineffective. She is scheduled to see a neurosurgeon on the 20th. Nerve regeneration can take 6-9 months, but her symptoms are worsening. - Continue gabapentin - Follow up with neurosurgeon on the 20th

## 2023-06-24 NOTE — Assessment & Plan Note (Signed)
 The patient is on medication with no reported side effects. - Continue current blood pressure medication

## 2023-06-24 NOTE — Assessment & Plan Note (Signed)
 The patient is in remission. She has had multiple colonoscopies due to a precancerous polyp and is scheduled for another follow-up in six months. - Continue current management for Crohn's disease - Follow up with gastroenterologist in six months

## 2023-06-24 NOTE — Assessment & Plan Note (Signed)
 The patient has high cholesterol. Recent lab results were reviewed. - Continue current management for hyperlipidemia

## 2023-07-01 ENCOUNTER — Other Ambulatory Visit: Payer: Self-pay

## 2023-07-01 DIAGNOSIS — E782 Mixed hyperlipidemia: Secondary | ICD-10-CM

## 2023-07-01 DIAGNOSIS — E66812 Obesity, class 2: Secondary | ICD-10-CM

## 2023-07-01 DIAGNOSIS — I10 Essential (primary) hypertension: Secondary | ICD-10-CM

## 2023-07-01 MED ORDER — ZEPBOUND 2.5 MG/0.5ML ~~LOC~~ SOAJ: 2.5000 mg | SUBCUTANEOUS | 0 refills | Status: DC

## 2023-07-01 NOTE — Telephone Encounter (Signed)
 I changed the order from the vial. It does not look like we were contacted about this. She will need a PA I am sure, but she has secondary insurance so there is a chance she may be approved.   Please let her know I have resent the order to see if this works.

## 2023-07-10 ENCOUNTER — Other Ambulatory Visit: Payer: Self-pay | Admitting: Nurse Practitioner

## 2023-07-10 DIAGNOSIS — E782 Mixed hyperlipidemia: Secondary | ICD-10-CM

## 2023-07-10 DIAGNOSIS — Z6835 Body mass index (BMI) 35.0-35.9, adult: Secondary | ICD-10-CM

## 2023-07-10 DIAGNOSIS — I10 Essential (primary) hypertension: Secondary | ICD-10-CM

## 2023-07-10 NOTE — Telephone Encounter (Signed)
 Copied from CRM [id number]. Topic: Clinical - Prescription Issue >> Jul 10, 2023 11:23 AM Louie Casa B wrote: Reason for CRM: tirzepatide (ZEPBOUND) 2.5 MG/0.5ML Pen needs prior authorization Please call [phone]

## 2023-07-11 ENCOUNTER — Telehealth: Payer: Self-pay

## 2023-07-11 ENCOUNTER — Other Ambulatory Visit (HOSPITAL_COMMUNITY): Payer: Self-pay

## 2023-07-11 NOTE — Telephone Encounter (Signed)
 Pharmacy Patient Advocate Encounter  Insurance verification completed.   The patient is insured through  [facility]    Ran test claim for Zepbound 2.5mg /0.31ml. Currently a quantity of 2ml is a 28 day supply and the co-pay is  . Is a plan benefit Exclusion.  This test claim was processed through [facility]- copay amounts may vary at other pharmacies due to pharmacy/plan contracts, or as the patient moves through the different stages of their insurance plan.

## 2023-07-11 NOTE — Telephone Encounter (Signed)
 Pharmacy Patient Advocate Encounter   Received notification from Physician's Office that prior authorization for Zepbound 2.5MG /0.5ML pen-injectors is required/requested.   Insurance verification completed.   The patient is insured through [employer] .   Effective dates: 2.26.25 to 3.28.26 Case ID: [id number]   Please note that this Rx had to sent through on her [employer] as the pts Primary Ins doesn't cover the drug. If their are issues filling the drug at the Pts pref'd pharmacy then the pharmacy may call the help desk to push through the Rx at  [phone].

## 2023-07-16 ENCOUNTER — Ambulatory Visit: Admitting: Nurse Practitioner

## 2023-07-22 ENCOUNTER — Telehealth: Payer: Self-pay | Admitting: Gastroenterology

## 2023-07-22 NOTE — Telephone Encounter (Signed)
 Which ever her preference is okay with me.  If she is already on Amjevita I would assume just continue that rather than switching back and forth, if she is okay with that.  If she strongly prefers Humira that is also okay.  Thanks

## 2023-07-22 NOTE — Telephone Encounter (Signed)
 Pt no longer has [employer]. She now has [employer]. UHC would not cover the Humira so she was switched to Amjevita. She wants to know should she go back on Humira or just stay on th Amjevita. She has been tolerating it fine. Either way needs new script sent to [facility]. Please advise.

## 2023-07-22 NOTE — Telephone Encounter (Signed)
 Inbound call from patient requesting a call from the nurse regarding Amjevita, she states that she had to switch from Humira to Amjevita due to insurance reasons and now her insurance will cover Humira. Patient is wanting to see if she needs to stay on Amjevita or go back to Humira. Patient also states she needs refill. Please advise.

## 2023-07-23 ENCOUNTER — Telehealth: Payer: Self-pay

## 2023-07-23 ENCOUNTER — Other Ambulatory Visit: Payer: Self-pay

## 2023-07-23 ENCOUNTER — Other Ambulatory Visit (HOSPITAL_COMMUNITY): Payer: Self-pay

## 2023-07-23 MED ORDER — AMJEVITA 80 MG/0.8ML ~~LOC~~ SOAJ: 1.0000 | SUBCUTANEOUS | 5 refills | Status: DC

## 2023-07-23 NOTE — Telephone Encounter (Signed)
 Pt aware, refill for Amjevita [facility], PA team aware. Pt states no longer has UHC, only tricare.

## 2023-07-23 NOTE — Telephone Encounter (Signed)
 Pharmacy Patient Advocate Encounter   Received notification from Pt Calls Messages that prior authorization for Amjevita 80MG /0.8ML auto-injectors is required/requested.   Insurance verification completed.   The patient is insured through [employer] .   Per test claim: PA required; PA submitted to above mentioned insurance via CoverMyMeds Key/confirmation #/EOC [id number] Status is pending

## 2023-07-24 ENCOUNTER — Other Ambulatory Visit (HOSPITAL_COMMUNITY): Payer: Self-pay

## 2023-07-24 ENCOUNTER — Other Ambulatory Visit: Payer: Self-pay

## 2023-07-24 MED ORDER — HUMIRA (2 PEN) 40 MG/0.4ML ~~LOC~~ AJKT: 40.0000 mg | AUTO-INJECTOR | SUBCUTANEOUS | 6 refills | Status: DC

## 2023-07-24 NOTE — Telephone Encounter (Signed)
 PA was done for Amjevita and it was denied, saying patient would have to have reaction to Humira. (Further information will be in pa encounter). Submitted PA for Humira, but it does not require one. Filling pharmacy will need to do an override (or call insurance) due to a cost maximum exceeded.

## 2023-07-24 NOTE — Telephone Encounter (Signed)
 Pharmacy Patient Advocate Encounter  Received notification from [employer] that Prior Authorization for Amjevita 80MG /0.8ML auto-injectors has been DENIED.  Full denial letter will be uploaded to the media tab. See denial reason below.  Coverage is provided in situations where an explanation has been provided why the patient requires the requested medication and cannot take Humira. Note: Acceptable responses: Patient has an allergy to an inactive ingredient found in Humira that is not in the requested medication.   PA #/Case ID/Reference #: [id number]

## 2023-07-24 NOTE — Telephone Encounter (Signed)
 Order entered for Humira, Note regarding PA and comments sent to pt via mychart.

## 2023-07-31 ENCOUNTER — Telehealth: Payer: Self-pay | Admitting: Gastroenterology

## 2023-07-31 ENCOUNTER — Other Ambulatory Visit: Payer: Self-pay

## 2023-07-31 MED ORDER — HUMIRA (2 PEN) 40 MG/0.4ML ~~LOC~~ AJKT: 40.0000 mg | AUTO-INJECTOR | SUBCUTANEOUS | 6 refills | Status: DC

## 2023-07-31 NOTE — Telephone Encounter (Signed)
 Patient called stating [facility] (?) needed more information regarding her Humira prescription.  She stated if they did not hear from us  within 24 hours it would be put on hold.  She will be out of her Humira this weekend and would like this to be taken care of asap.  Thank you.

## 2023-07-31 NOTE — Telephone Encounter (Signed)
New script sent, may need PA

## 2023-08-01 ENCOUNTER — Other Ambulatory Visit (HOSPITAL_COMMUNITY): Payer: Self-pay

## 2023-08-01 ENCOUNTER — Telehealth: Payer: Self-pay

## 2023-08-01 NOTE — Telephone Encounter (Signed)
 No PA required

## 2023-08-01 NOTE — Telephone Encounter (Signed)
 Pharmacy Patient Advocate Encounter   Received notification from Pt Calls Messages that prior authorization for Humira  (2 Pen)(CF) 40MG /0.4ML auto-injector kit is required/requested.   Insurance verification completed.   The patient is insured through [employer] .   Per test claim:  Drug is covered by current benefit plan. No further PA activity needed

## 2023-08-07 NOTE — Telephone Encounter (Signed)
 Spoke with pt and let her know [facility] has the script question answered and she should be able to get it now.

## 2023-08-07 NOTE — Telephone Encounter (Signed)
 Inbound call from patient, states [facility] is stating they need verification about dosage, states she is about to miss her dose and would like for someone to reach [facility].

## 2023-08-08 ENCOUNTER — Other Ambulatory Visit (HOSPITAL_COMMUNITY): Payer: Self-pay

## 2023-08-08 ENCOUNTER — Telehealth: Payer: Self-pay | Admitting: Gastroenterology

## 2023-08-08 ENCOUNTER — Other Ambulatory Visit: Payer: Self-pay

## 2023-08-08 MED ORDER — HUMIRA (2 PEN) 80 MG/0.8ML ~~LOC~~ AJKT: 80.0000 mg | AUTO-INJECTOR | SUBCUTANEOUS | 6 refills | Status: DC

## 2023-08-08 NOTE — Telephone Encounter (Signed)
 Patient called and stated that she was contacted by [facility] and they advise her that her Humira  was going to be refill for 40 MG. Patient stated that she was incorrect and it should be 80 MG. Patient is requesting that we resend over a new prescription for her Humira  80 MG. Please advise.

## 2023-08-08 NOTE — Telephone Encounter (Signed)
 New script sent to [facility] for Humira  80mg /0.41ml 2 pens. Pt to inject 80mg  every 14 days. Will need new PA.

## 2023-08-11 ENCOUNTER — Telehealth: Payer: Self-pay

## 2023-08-11 ENCOUNTER — Other Ambulatory Visit (HOSPITAL_COMMUNITY): Payer: Self-pay

## 2023-08-11 NOTE — Telephone Encounter (Signed)
 PA request has been Cancelled. New Encounter has been or will be created for follow up. For additional info see Pharmacy Prior Auth telephone encounter from 08-11-2023.

## 2023-08-11 NOTE — Telephone Encounter (Signed)
 Will need PA for Humira  80 mg/0.8 ml. Is not on 40 mg/0.4 ml any longer. Thank you

## 2023-08-11 NOTE — Telephone Encounter (Signed)
 Pharmacy Patient Advocate Encounter   Received notification from Pt Calls Messages that prior authorization for Humira  (2 Pen) (CF) 80MG /0.8ML auto-injector kit is required/requested.   Insurance verification completed.   The patient is insured through [employer] .   Per test claim:

## 2023-08-12 NOTE — Telephone Encounter (Signed)
 PA not required.

## 2023-09-02 ENCOUNTER — Other Ambulatory Visit: Payer: Self-pay | Admitting: Nurse Practitioner

## 2023-09-02 DIAGNOSIS — E782 Mixed hyperlipidemia: Secondary | ICD-10-CM

## 2023-09-02 DIAGNOSIS — Z6835 Body mass index (BMI) 35.0-35.9, adult: Secondary | ICD-10-CM

## 2023-09-02 DIAGNOSIS — E66812 Obesity, class 2: Secondary | ICD-10-CM

## 2023-09-02 DIAGNOSIS — I10 Essential (primary) hypertension: Secondary | ICD-10-CM

## 2023-09-10 ENCOUNTER — Other Ambulatory Visit: Payer: Self-pay

## 2023-09-10 MED ORDER — VALSARTAN 80 MG PO TABS: 80.0000 mg | ORAL_TABLET | Freq: Every day | ORAL | 3 refills | Status: AC

## 2023-09-30 ENCOUNTER — Other Ambulatory Visit: Payer: Self-pay | Admitting: Nurse Practitioner

## 2023-09-30 DIAGNOSIS — Z6835 Body mass index (BMI) 35.0-35.9, adult: Secondary | ICD-10-CM

## 2023-09-30 DIAGNOSIS — I10 Essential (primary) hypertension: Secondary | ICD-10-CM

## 2023-09-30 DIAGNOSIS — E782 Mixed hyperlipidemia: Secondary | ICD-10-CM

## 2023-10-10 ENCOUNTER — Ambulatory Visit (INDEPENDENT_AMBULATORY_CARE_PROVIDER_SITE_OTHER): Payer: Medicare Other | Admitting: Nurse Practitioner

## 2023-10-10 ENCOUNTER — Encounter: Payer: Self-pay | Admitting: Nurse Practitioner

## 2023-10-10 VITALS — BP 128/80 | HR 80 | Ht 65.75 in | Wt 217.0 lb

## 2023-10-10 DIAGNOSIS — I10 Essential (primary) hypertension: Secondary | ICD-10-CM

## 2023-10-10 DIAGNOSIS — Z0000 Encounter for general adult medical examination without abnormal findings: Secondary | ICD-10-CM | POA: Diagnosis not present

## 2023-10-10 DIAGNOSIS — Z6835 Body mass index (BMI) 35.0-35.9, adult: Secondary | ICD-10-CM

## 2023-10-10 DIAGNOSIS — K50819 Crohn's disease of both small and large intestine with unspecified complications: Secondary | ICD-10-CM

## 2023-10-10 DIAGNOSIS — E782 Mixed hyperlipidemia: Secondary | ICD-10-CM

## 2023-10-10 DIAGNOSIS — M6740 Ganglion, unspecified site: Secondary | ICD-10-CM

## 2023-10-10 DIAGNOSIS — E66812 Obesity, class 2: Secondary | ICD-10-CM

## 2023-10-10 DIAGNOSIS — E559 Vitamin D deficiency, unspecified: Secondary | ICD-10-CM

## 2023-10-10 DIAGNOSIS — Z131 Encounter for screening for diabetes mellitus: Secondary | ICD-10-CM

## 2023-10-10 DIAGNOSIS — S8401XS Injury of tibial nerve at lower leg level, right leg, sequela: Secondary | ICD-10-CM

## 2023-10-10 MED ORDER — TIRZEPATIDE-WEIGHT MANAGEMENT 7.5 MG/0.5ML ~~LOC~~ SOAJ: 7.5000 mg | SUBCUTANEOUS | 0 refills | Status: DC

## 2023-10-10 MED ORDER — ZEPBOUND 10 MG/0.5ML ~~LOC~~ SOAJ: 10.0000 mg | SUBCUTANEOUS | 0 refills | Status: DC

## 2023-10-10 MED ORDER — ZEPBOUND 5 MG/0.5ML ~~LOC~~ SOAJ: 5.0000 mg | SUBCUTANEOUS | 0 refills | Status: DC

## 2023-10-10 NOTE — Patient Instructions (Signed)
  Ms. Heyboer , Thank you for taking time to come for your Medicare Wellness Visit. I appreciate your ongoing commitment to your health goals. Please review the following plan we discussed and let me know if I can assist you in the future.   These are the goals we discussed:  Goals       Patient Stated      Connie Porter PT and get back into the gym.       Patient Stated (pt-stated)      Get my weight down.         This is a list of the screening recommended for you and due dates:  Health Maintenance  Topic Date Due   Colon Cancer Screening  12/30/2023   Pneumococcal Vaccine for age over 71 (3 of 3 - PCV20 or PCV21) 06/22/2024*   COVID-19 Vaccine (6 - 2024-25 season) 10/09/2024*   Flu Shot  11/14/2023   Medicare Annual Wellness Visit  10/09/2024   Mammogram  01/14/2025   DTaP/Tdap/Td vaccine (4 - Td or Tdap) 02/12/2025   DEXA scan (bone density measurement)  Completed   Hepatitis C Screening  Completed   Zoster (Shingles) Vaccine  Completed   Hepatitis B Vaccine  Aged Out   HPV Vaccine  Aged Out   Meningitis B Vaccine  Aged Out  *Topic was postponed. The date shown is not the original due date.

## 2023-10-10 NOTE — Progress Notes (Unsigned)
 10/10/2023   Vitals:  BP 128/80   Pulse 80   Ht 5' 5.75 (1.67 m)   Wt 217 lb (98.4 kg)   BMI 35.29 kg/m   Body mass index is 35.29 kg/m. Connie Porter is a 68 y.o. female who presents for Subsequent Medicare Annual Wellness Exam  Care Team Members: Current Providers as of 10/10/2023 PCP: Oris Camie BRAVO, NP Care Team Provider: Alveta Aleene PARAS, MD Care Team Provider: Teressa Toribio SQUIBB, MD Care Team Provider: [facility] Care Team Provider: Josefina Chew, MD Encounter Provider: Oris Camie BRAVO, NP, starting on Fri Oct 10, 2023 12:00 AM Referring Provider: Oris Camie BRAVO, NP, starting on Fri Oct 10, 2023 12:00 AM Attending Provider: Oris Camie BRAVO, NP, starting on Tue Oct 08, 2022 10:24 AM (Active)   Method of visit:  in person In the event virtual visit conducted, the patient consented to a virtual visit. Patient consented to have virtual visit and was identified by two identifiers.  Encounter participants: Patient: Connie Porter - located AWV Patient Visit Location: In Office Nurse/Provider: Camie CHARLENA Oris - located Virtual Visit Location Provider: Office/Clinic Others (if applicable): patient only  HPI History of Present Illness Connie Porter is a 68 year old female who presents for a Medicare wellness visit.   She has experienced slow weight loss since starting Zepbound  in March. Initially, the medication significantly reduced her appetite, but she feels she has become accustomed to the current dose. She takes Zepbound  every Monday at a low dose of 2.5 mg. Her diet is high in protein, including protein drinks, rotisserie chicken, cucumbers, [place] yogurt, and protein snacks. No unintentional weight loss, swelling in her feet or ankles, or vaginal bleeding. She takes a daily vitamin D  supplement.  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 This  condition has persisted for nine months, and she has undergone nerve conduction tests and MRIs. She takes gabapentin but is unsure of its effectiveness. The numbness affects her ability to walk and wear certain footwear, such as flip-flops.  She has Crohn's disease and is currently in remission. She previously switched from Humira  to Amjevita  due to insurance changes but has returned to Humira  with [employer] coverage. She is concerned about potential insurance changes affecting her medication. She has undergone two colonoscopies in 2025 due to a large polyp that required removal in pieces.  She reports a ganglion cyst on her arm that has increased in size and causes discomfort. She is considering consulting a specialist for treatment.  She has a history of shoulder replacement surgery. 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  Review of Systems:  Neuro: Denies difficulty remembering daily tasks, people, or places.  Ear: Denies difficulty hearing or need to increase volume on television or telephone to hear Eye: Denies visual changes, difficulty reading normal print, or visual field deficits. Cardiac: Denies chest pain, palpitations, dizziness, shortness of breath, pain in lower extremities, or night time waking with shortness of breath. Lung: Denies shortness of breath, difficulty breathing, chronic cough, or dizziness.  GI: Denies changes in bowel habits, blood in stool, difficulty passing stool, decreased intake of food or drink, nausea, or vomiting.  GU: Denies changes in urinary habits, dark urine, malodorous urine, increased or decreased urination, or urinary incontinence.  MSK: Denies weakness in extremities, difficulty walking, difficulty grasping, or new MSK pain.  Skin: Denies changes to the skin, fragile skin, or increased bruising.  Constitution: Denies fatigue, weakness, or confusion.   Patient rating of health: better as this time last year  Clinical  Intake: Pre-visit preparation completed: Yes  Pain : 0-10 Pain Type: Neuropathic pain Pain Location: Tibia Pain Orientation: Right Pain Onset: More than a month ago Pain Frequency: Constant     BMI - recorded: 35 Nutritional Status: BMI > 30  Obese Nutritional Risks: None Diabetes: No  Activities of Daily Living: Independent Ambulation: Independent Medication Administration: Independent Home Management: Independent  Barriers to Care Management & Learning: None  Do you feel unsafe in your current relationship?: No Do you feel physically threatened by others?: No Anyone hurting you at home, work, or school?: No Unable to ask?: No Information provided on Community resources: No  How often do you need to have someone help you when you read instructions, pamphlets, or other written materials from your doctor or pharmacy?: 1 - Never What is the last grade level you completed in school?: Bachelors +  Interpreter Needed?: No  Information entered by :: SaraBeth Liora Myles     10/10/2023    9:08 AM 10/08/2022    9:16 AM 10/02/2021    9:25 AM 10/07/2014    9:53 AM 09/26/2014    2:50 PM 05/02/2014    2:00 PM 09/24/2013   10:13 AM  Advanced Directives  Does Patient Have a Medical Advance Directive? No No No No  No  No  Patient does not have advance directive   Would patient like information on creating a medical advance directive? No - Patient declined No - Patient declined Yes (ED - Information included in AVS) No - patient declined information  No - patient declined information  No - patient declined information       Data saved with a previous flowsheet row definition    Social Determinants of Health SDOH Screenings   Food Insecurity: No Food Insecurity (10/06/2023)  Housing: Low Risk  (10/06/2023)  Transportation Needs: No Transportation Needs (10/06/2023)  Utilities: Not At Risk (10/08/2022)  Alcohol Screen: Low Risk  (10/06/2023)  Depression (PHQ2-9): Low Risk  (10/10/2023)   Financial Resource Strain: Low Risk  (10/06/2023)  Physical Activity: Sufficiently Active (10/06/2023)  Social Connections: Moderately Isolated (10/06/2023)  Stress: No Stress Concern Present (10/06/2023)  Tobacco Use: Low Risk  (10/10/2023)     Functional Status Survey: Is the patient deaf or have difficulty hearing?: No Does the patient have difficulty seeing, even when wearing glasses/contacts?: Yes Does the patient have difficulty concentrating, remembering, or making decisions?: No Does the patient have difficulty walking or climbing stairs?: No Does the patient have difficulty dressing or bathing?: No Does the patient have difficulty doing errands alone such as visiting a doctor's office or shopping?: No   Annual Goal:  Goals       Patient Stated      Fleurette PT and get back into the gym.       Patient Stated (pt-stated)      Get my weight down.        Fall Risk    10/10/2023    9:07 AM 06/23/2023    9:04 AM 10/08/2022    9:15 AM 06/07/2022    8:11 AM 10/02/2021    9:26 AM  Fall Risk   Falls in the past year? 0 0 0 0 0  Number falls in past yr: 0 0 0 0 0  Injury with Fall? 0 0 0  0 0  Risk for fall due to : No Fall Risks  No Fall Risks No Fall Risks No Fall Risks  Follow up Falls evaluation completed  Falls evaluation completed Falls evaluation completed Falls evaluation completed;Education provided      Data saved with a previous flowsheet row definition   Medicare Risk     Cognitive Function Normal: Yes Exam Completed:         Mini-Cog - 10/10/23 0907     Normal clock drawing test? yes    How many words correct? 3          Depression Screening    10/10/2023    9:07 AM 06/23/2023    9:04 AM 10/08/2022    9:15 AM 06/07/2022    8:12 AM 10/02/2021    9:30 AM  Depression screen PHQ 2/9  Decreased Interest 0 0 0 0 0  Down, Depressed, Hopeless 0 0 0 0 0  PHQ - 2 Score 0 0 0 0 0     Activities of Daily Living    10/10/2023    9:08 AM  In your present  state of health, do you have any difficulty performing the following activities:  Hearing? 0  Vision? 1  Difficulty concentrating or making decisions? 0  Walking or climbing stairs? 0  Dressing or bathing? 0  Doing errands, shopping? 0  Preparing Food and eating ? N  Using the Toilet? N  In the past six months, have you accidently leaked urine? N  Do you have problems with loss of bowel control? N  Managing your Medications? N  Managing your Finances? N  Housekeeping or managing your Housekeeping? N    Tobacco Social History   Tobacco Use  Smoking Status Never  Smokeless Tobacco Never     Counseling given: Not Answered   Hospitalizations in the Past Year: none  ED Visits in the Past Year: No  Surgeries in the Past Year: No   History    Medication List Current Meds  Medication Sig   adalimumab  (HUMIRA , 2 PEN,) 80 MG/0.8ML pen Inject 0.8 mLs (80 mg total) into the skin every 14 (fourteen) days.   CALCIUM  CITRATE PO Take 1 tablet by mouth 2 (two) times daily.   Cholecalciferol (VITAMIN D -3 PO) Take 1 capsule by mouth at bedtime.   Cyanocobalamin (VITAMIN B-12 PO) Take 1 tablet by mouth daily.   cyclobenzaprine (FLEXERIL) 5 MG tablet Take 5 mg by mouth at bedtime as needed.   dicyclomine  (BENTYL ) 10 MG capsule Take 1 capsule (10 mg total) by mouth every 8 (eight) hours as needed for spasms.   ezetimibe  (ZETIA ) 10 MG tablet Take 1 tablet (10 mg total) by mouth daily.   fexofenadine (ALLEGRA) 180 MG tablet Take 180 mg by mouth daily.   gabapentin (NEURONTIN) 300 MG capsule Take 300 mg by mouth 3 (three) times daily.   Iron-Folic Acid-Vit B12 (IRON FORMULA PO) Take by mouth.   levocetirizine (XYZAL ) 5 MG tablet Take 5 mg by mouth every evening.   montelukast  (SINGULAIR ) 10 MG tablet Take 10 mg by mouth at bedtime.   Multiple Vitamin (MULITIVITAMIN WITH MINERALS) TABS Take 1 tablet by mouth 2 (two) times daily.   Omega-3 Fatty Acids (FISH OIL PO) Take 2 capsules by mouth  2 (two) times daily.    rosuvastatin  (CRESTOR ) 20 MG tablet TAKE 1 TABLET(20 MG) BY MOUTH DAILY   tirzepatide  (ZEPBOUND ) 10 MG/0.5ML Pen Inject 10 mg into the skin once a week. Start after  completing 4 weeks on 7.5mg    tirzepatide  (ZEPBOUND ) 5 MG/0.5ML Pen Inject 5 mg into the skin once a week.   tirzepatide  (ZEPBOUND ) 7.5 MG/0.5ML Pen Inject 7.5 mg into the skin once a week. Start after completing 4 weeks of 5mg    valsartan  (DIOVAN ) 80 MG tablet Take 1 tablet (80 mg total) by mouth daily.   [DISCONTINUED] ZEPBOUND  2.5 MG/0.5ML Pen ADMINISTER 2.5 MG UNDER THE SKIN 1 TIME A WEEK     Immunizations Immunization History  Administered Date(s) Administered   Fluad Quad(high Dose 65+) 01/11/2020, 01/18/2022   Influenza Inj Mdck Quad Pf 01/31/2017   Influenza,inj,Quad PF,6+ Mos 01/13/2014, 02/13/2015, 02/23/2016, 01/19/2018, 01/16/2019   Influenza-Unspecified 01/31/2017, 01/19/2018, 01/07/2023   PFIZER(Purple Top)SARS-COV-2 Vaccination 07/04/2019, 12/23/2019, 07/19/2020   Pfizer Covid-19 Vaccine Bivalent Booster 68yrs & up 12/25/2020   Pneumococcal Conjugate-13 03/19/2019   Pneumococcal Polysaccharide-23 08/25/2012   Td 09/15/2003   Td (Adult),5 Lf Tetanus Toxid, Preservative Free 09/15/2003   Tdap 02/13/2015   Unspecified SARS-COV-2 Vaccination 01/18/2022   Zoster Recombinant(Shingrix) 04/20/2018, 06/15/2018   Zoster, Live 02/21/2015     Screening Tests Health Maintenance  Topic Date Due   Colonoscopy  12/30/2023   Pneumococcal Vaccine: 50+ Years (3 of 3 - PCV20 or PCV21) 06/22/2024 (Originally 03/18/2024)   COVID-19 Vaccine (6 - 2024-25 season) 10/09/2024 (Originally 12/15/2022)   INFLUENZA VACCINE  11/14/2023   Medicare Annual Wellness (AWV)  10/09/2024   MAMMOGRAM  01/14/2025   DTaP/Tdap/Td (4 - Td or Tdap) 02/12/2025   DEXA SCAN  Completed   Hepatitis C Screening  Completed   Zoster Vaccines- Shingrix  Completed   Hepatitis B Vaccines  Aged Out   HPV VACCINES  Aged Out    Meningococcal B Vaccine  Aged Out    Health Maintenance Screenings  Health Maintenance Topics with due status: Due Soon     Topic Date Due   Colonoscopy 12/30/2023     Past Medical History:  Diagnosis Date   Chronic left shoulder pain 06/07/2022   Crohn's disease (HCC)    Dental crowns present    Encounter for annual physical exam 11/13/2022   GERD (gastroesophageal reflux disease)    High cholesterol    History of hiatal hernia    HTN (hypertension) 04/27/2018   Hyperlipidemia 01/13/2014   Osteoarthritis of knee, unilateral 09/2014   right    Patellofemoral arthritis of right knee 10/07/2014   Seasonal allergies    Past Surgical History:  Procedure Laterality Date   APPENDECTOMY     CESAREAN SECTION     x 3   CHOLECYSTECTOMY     COLONOSCOPY     ESOPHAGOGASTRODUODENOSCOPY (EGD) WITH ESOPHAGEAL DILATION     x 2   JOINT REPLACEMENT  07/11/2022   Shoulder replacement   PATELLA-FEMORAL ARTHROPLASTY Right 10/07/2014   Procedure: RIGHT PATELLA-FEMORAL ARTHROPLASTY;  Surgeon: Fonda Olmsted, MD;  Location: [facility];  Service: Orthopedics;  Laterality: Right;   SHOULDER SURGERY Left    TONSILLECTOMY AND ADENOIDECTOMY     TUBAL LIGATION  O8/13/1991   WISDOM TOOTH EXTRACTION     Family History  Problem Relation Age of Onset   Arthritis Mother    Dementia Mother    Heart disease Father 61   Hyperlipidemia Father    Hypertension Father    Heart attack Father    Heart disease Brother 13       MI   Parkinson's disease Brother    Stroke Maternal Grandfather    Asthma Son  Colon cancer Neg Hx    Esophageal cancer Neg Hx    Pancreatic cancer Neg Hx    Stomach cancer Neg Hx    Liver disease Neg Hx    Rectal cancer Neg Hx    Social History   Socioeconomic History   Marital status: Married    Spouse name: Not on file   Number of children: 3   Years of education: Not on file   Highest education level: Bachelor's degree (e.g., BA, AB, BS)   Occupational History   Occupation: Retired Runner, broadcasting/film/video  Tobacco Use   Smoking status: Never   Smokeless tobacco: Never  Vaping Use   Vaping status: Never Used  Substance and Sexual Activity   Alcohol use: Yes    Alcohol/week: 4.0 standard drinks of alcohol    Types: 4 Glasses of wine per week    Comment: weekends   Drug use: No   Sexual activity: Yes    Birth control/protection: Post-menopausal  Other Topics Concern   Not on file  Social History Narrative   Not on file   Social Drivers of Health   Financial Resource Strain: Low Risk  (10/06/2023)   Overall Financial Resource Strain (CARDIA)    Difficulty of Paying Living Expenses: Not hard at all  Food Insecurity: No Food Insecurity (10/06/2023)   Hunger Vital Sign    Worried About Running Out of Food in the Last Year: Never true    Ran Out of Food in the Last Year: Never true  Transportation Needs: No Transportation Needs (10/06/2023)   PRAPARE - Administrator, Civil Service (Medical): No    Lack of Transportation (Non-Medical): No  Physical Activity: Sufficiently Active (10/06/2023)   Exercise Vital Sign    Days of Exercise per Week: 4 days    Minutes of Exercise per Session: 60 min  Stress: No Stress Concern Present (10/06/2023)   [employer] of Occupational Health - Occupational Stress Questionnaire    Feeling of Stress: Not at all  Social Connections: Moderately Isolated (10/06/2023)   Social Connection and Isolation Panel    Frequency of Communication with Friends and Family: More than three times a week    Frequency of Social Gatherings with Friends and Family: Once a week    Attends Religious Services: Never    Database administrator or Organizations: No    Attends Banker Meetings: Not on file    Marital Status: Married    Outpatient Encounter Medications as of 10/10/2023  Medication Sig   adalimumab  (HUMIRA , 2 PEN,) 80 MG/0.8ML pen Inject 0.8 mLs (80 mg total) into the skin every 14  (fourteen) days.   CALCIUM  CITRATE PO Take 1 tablet by mouth 2 (two) times daily.   Cholecalciferol (VITAMIN D -3 PO) Take 1 capsule by mouth at bedtime.   Cyanocobalamin (VITAMIN B-12 PO) Take 1 tablet by mouth daily.   cyclobenzaprine (FLEXERIL) 5 MG tablet Take 5 mg by mouth at bedtime as needed.   dicyclomine  (BENTYL ) 10 MG capsule Take 1 capsule (10 mg total) by mouth every 8 (eight) hours as needed for spasms.   ezetimibe  (ZETIA ) 10 MG tablet Take 1 tablet (10 mg total) by mouth daily.   fexofenadine (ALLEGRA) 180 MG tablet Take 180 mg by mouth daily.   gabapentin (NEURONTIN) 300 MG capsule Take 300 mg by mouth 3 (three) times daily.   Iron-Folic Acid-Vit B12 (IRON FORMULA PO) Take by mouth.   levocetirizine (XYZAL ) 5 MG tablet Take 5  mg by mouth every evening.   montelukast  (SINGULAIR ) 10 MG tablet Take 10 mg by mouth at bedtime.   Multiple Vitamin (MULITIVITAMIN WITH MINERALS) TABS Take 1 tablet by mouth 2 (two) times daily.   Omega-3 Fatty Acids (FISH OIL PO) Take 2 capsules by mouth 2 (two) times daily.    rosuvastatin  (CRESTOR ) 20 MG tablet TAKE 1 TABLET(20 MG) BY MOUTH DAILY   tirzepatide  (ZEPBOUND ) 10 MG/0.5ML Pen Inject 10 mg into the skin once a week. Start after completing 4 weeks on 7.5mg    tirzepatide  (ZEPBOUND ) 5 MG/0.5ML Pen Inject 5 mg into the skin once a week.   tirzepatide  (ZEPBOUND ) 7.5 MG/0.5ML Pen Inject 7.5 mg into the skin once a week. Start after completing 4 weeks of 5mg    valsartan  (DIOVAN ) 80 MG tablet Take 1 tablet (80 mg total) by mouth daily.   [DISCONTINUED] ZEPBOUND  2.5 MG/0.5ML Pen ADMINISTER 2.5 MG UNDER THE SKIN 1 TIME A WEEK   Olopatadine-Mometasone (RYALTRIS) 665-25 MCG/ACT SUSP 2 puffs in each nostril Nasally Twice a day; Duration: 30 days   [DISCONTINUED] amoxicillin  (AMOXIL ) 500 MG tablet TAKE 4 TABLETS BY MOUTH 1 HOUR BEFORE DENTAL WORK (Patient not taking: Reported on 10/10/2023)   No facility-administered encounter medications on file as of  10/10/2023.    Physical Exam: Yes-Med Check Exam Physical Exam Vitals and nursing note reviewed.  Constitutional:      General: She is not in acute distress.    Appearance: Normal appearance. She is obese. She is not ill-appearing.  HENT:     Head: Normocephalic.   Eyes:     Conjunctiva/sclera: Conjunctivae normal.   Neck:     Vascular: No carotid bruit.   Cardiovascular:     Rate and Rhythm: Normal rate and regular rhythm.     Pulses: Normal pulses.     Heart sounds: Normal heart sounds.  Pulmonary:     Effort: Pulmonary effort is normal.     Breath sounds: Normal breath sounds.  Abdominal:     General: There is no distension.     Palpations: Abdomen is soft.     Tenderness: There is no abdominal tenderness. There is no guarding.   Musculoskeletal:     Cervical back: Neck supple.     Right lower leg: No edema.     Left lower leg: No edema.     Comments: Firm, round nodularity noted on the posterior wrist consistent with ganglion cyst.    Skin:    General: Skin is warm and dry.     Capillary Refill: Capillary refill takes less than 2 seconds.   Neurological:     Mental Status: She is alert and oriented to person, place, and time.     Sensory: Sensory deficit present.     Motor: No weakness.     Coordination: Coordination normal.     Gait: Gait normal.   Psychiatric:        Mood and Affect: Mood normal.     PLAN  Exercise Activities and Dietary Recommendations - choose a type of activity I enjoy such as biking, gardening, team sports, walking Carb modified diet  Fall Prevention - always use handrails on the stairs - always wear shoes or slippers with non-slip sole - get at least 10 minutes of activity every day  Orders Placed This Encounter  Procedures   Hemoglobin A1c    Release to patient:   Immediate   Comprehensive metabolic panel with GFR    Has the patient fasted?:  Yes    Release to patient:   Immediate   VITAMIN D  25 Hydroxy (Vit-D  Deficiency, Fractures)    Release to patient:   Immediate     I have personally reviewed and noted the following in the patient's chart:   Medical and social history Use of alcohol, tobacco or illicit drugs  Current medications and supplements Functional ability and status Nutritional status Physical activity Advanced directives List of other physicians Hospitalizations, surgeries, and ER visits in previous 12 months Vitals Screenings to include cognitive, depression, and falls Referrals and appointments  In addition, I have reviewed and discussed with patient certain preventive protocols, quality metrics, and best practice recommendations. A written personalized care plan for preventive services as well as general preventive health recommendations were provided to patient.   Camie CHARLENA Doing, NP  10/10/2023

## 2023-10-11 LAB — COMPREHENSIVE METABOLIC PANEL WITH GFR
ALT: 13 IU/L (ref 0–32)
AST: 21 IU/L (ref 0–40)
Albumin: 4.4 g/dL (ref 3.9–4.9)
Alkaline Phosphatase: 49 IU/L (ref 44–121)
BUN/Creatinine Ratio: 23 (ref 12–28)
BUN: 19 mg/dL (ref 8–27)
Bilirubin Total: 0.3 mg/dL (ref 0.0–1.2)
CO2: 20 mmol/L (ref 20–29)
Calcium: 9.9 mg/dL (ref 8.7–10.3)
Chloride: 104 mmol/L (ref 96–106)
Creatinine, Ser: 0.83 mg/dL (ref 0.57–1.00)
Globulin, Total: 2.8 g/dL (ref 1.5–4.5)
Glucose: 85 mg/dL (ref 70–99)
Potassium: 4.8 mmol/L (ref 3.5–5.2)
Sodium: 139 mmol/L (ref 134–144)
Total Protein: 7.2 g/dL (ref 6.0–8.5)
eGFR: 76 mL/min/{1.73_m2} (ref >59)

## 2023-10-11 LAB — HEMOGLOBIN A1C
Est. average glucose Bld gHb Est-mCnc: 88 mg/dL
Hgb A1c MFr Bld: 4.7 % — ABNORMAL LOW (ref 4.8–5.6)

## 2023-10-11 LAB — VITAMIN D 25 HYDROXY (VIT D DEFICIENCY, FRACTURES): Vit D, 25-Hydroxy: 60.8 ng/mL (ref 30.0–100.0)

## 2023-10-13 DIAGNOSIS — M6740 Ganglion, unspecified site: Secondary | ICD-10-CM | POA: Insufficient documentation

## 2023-10-13 NOTE — Assessment & Plan Note (Signed)
 Due for a mammogram in October. Recent cholesterol blood tests by cardiologist. Metabolic panel and A1c needed due to current medication use. Vitamin D  levels not checked recently. Follow-up with cardiologist in December and potential colonoscopy with Dr. Leigh in September. Managing multiple medications and insurance coverages. Colonoscopy important due to previous findings of a large polyp. - Order metabolic panel and A1c - Check vitamin D  levels - Schedule mammogram for October - Follow up with cardiologist in December - Schedule colonoscopy with Dr. Leigh in September - Monitor insurance coverage for medications

## 2023-10-13 NOTE — Assessment & Plan Note (Signed)
 Valsartan  well tolerated with no concerns at this time. BP stable. Continue current regimen.

## 2023-10-13 NOTE — Assessment & Plan Note (Signed)
 Rosuvastatin  well tolerated with no concerns at this time. Labs pending today for evaluation of efficacy. Diet and exercise recommendations include daily activity of at least 20 minutes and low carb, low fat diet options high in fiber and protein.

## 2023-10-13 NOTE — Assessment & Plan Note (Signed)
 Experiencing slow weight loss with Zepbound  since March. Initial appetite suppression has diminished. Considering increasing dose from 2.5 mg to 5 mg to enhance weight loss. Engages in regular physical activity and maintains a high-protein diet. Slow weight loss is beneficial for long-term maintenance. - Increase Zepbound  dose to 5 mg after completing current 2.5 mg supply - Send prescription for 5 mg, 7.5 mg, and 10 mg doses to [facility] - Monitor weight loss progress and adjust dose as needed - Encourage continued physical activity and high-protein diet

## 2023-10-13 NOTE — Assessment & Plan Note (Signed)
 Repeat labs today

## 2023-10-13 NOTE — Assessment & Plan Note (Signed)
 In remission with Crohn's disease, currently on Humira  covered by [employer]. Previously switched to Amjevita  due to insurance changes but returned to Humira . Maintaining current medication is crucial to prevent flare-ups. - Continue Humira  as prescribed - Monitor for any changes in symptoms

## 2023-10-13 NOTE — Assessment & Plan Note (Signed)
 Ganglion cyst on the arm has increased in size and causes discomfort. Orthopedic evaluation recommended for potential treatment options, including aspiration or surgical removal. Cyst may not resolve on its own and could worsen. - Refer to orthopedic surgeon for evaluation and management of ganglion cyst

## 2023-10-13 NOTE — Assessment & Plan Note (Signed)
 Right leg tibial nerve damage causing numbness and shooting pains in the foot for nine months. Previous nerve conduction tests and MRIs confirmed diagnosis. Neurosurgeon ruled out surgery. Currently on gabapentin. Referral to neurologist recommended if symptoms persist beyond nine months. Healing process is slow and depends on severity of damage. - Refer to neurologist for further evaluation and management if symptoms persist beyond nine months - Continue gabapentin as prescribed

## 2023-10-15 DIAGNOSIS — M25461 Effusion, right knee: Secondary | ICD-10-CM | POA: Diagnosis not present

## 2023-10-15 DIAGNOSIS — M67431 Ganglion, right wrist: Secondary | ICD-10-CM | POA: Diagnosis not present

## 2023-10-23 DIAGNOSIS — M7121 Synovial cyst of popliteal space [Baker], right knee: Secondary | ICD-10-CM | POA: Diagnosis not present

## 2023-11-05 ENCOUNTER — Ambulatory Visit: Payer: Self-pay | Admitting: Nurse Practitioner

## 2023-11-11 DIAGNOSIS — H353131 Nonexudative age-related macular degeneration, bilateral, early dry stage: Secondary | ICD-10-CM | POA: Diagnosis not present

## 2023-11-24 ENCOUNTER — Telehealth: Payer: Self-pay

## 2023-11-24 NOTE — Telephone Encounter (Signed)
 I have printed the message for review from the provider and will send in the script once she approves which mg she wants the pt. On.   Copied from CRM #[id number]. Topic: Clinical - Medication Question >> Nov 24, 2023  2:25 PM Thliyah D wrote: tirzepatide  (ZEPBOUND ) 7.5 MG/0.5ML Pen Patent want to go through e scripts because it's cheaper. They give 90 supply and she wants to know if it's ok to stay on the 7.5 or go to 10. Pcp will need to call it in to them because they won't take it from [facility].

## 2023-11-25 ENCOUNTER — Encounter: Payer: Self-pay | Admitting: Nurse Practitioner

## 2023-11-26 ENCOUNTER — Other Ambulatory Visit: Payer: Self-pay | Admitting: Nurse Practitioner

## 2023-11-26 ENCOUNTER — Other Ambulatory Visit: Payer: Self-pay

## 2023-11-26 DIAGNOSIS — E782 Mixed hyperlipidemia: Secondary | ICD-10-CM

## 2023-11-26 DIAGNOSIS — I10 Essential (primary) hypertension: Secondary | ICD-10-CM

## 2023-11-26 DIAGNOSIS — E66812 Obesity, class 2: Secondary | ICD-10-CM

## 2023-11-26 MED ORDER — ZEPBOUND 10 MG/0.5ML ~~LOC~~ SOAJ
10.0000 mg | SUBCUTANEOUS | 0 refills | Status: DC
Start: 2023-11-26 — End: 2023-11-26

## 2023-11-26 MED ORDER — ZEPBOUND 10 MG/0.5ML ~~LOC~~ SOAJ
10.0000 mg | SUBCUTANEOUS | 1 refills | Status: DC
Start: 2023-11-26 — End: 2024-02-13

## 2023-12-31 DIAGNOSIS — R200 Anesthesia of skin: Secondary | ICD-10-CM | POA: Diagnosis not present

## 2024-01-02 DIAGNOSIS — J301 Allergic rhinitis due to pollen: Secondary | ICD-10-CM | POA: Diagnosis not present

## 2024-01-02 DIAGNOSIS — K219 Gastro-esophageal reflux disease without esophagitis: Secondary | ICD-10-CM | POA: Diagnosis not present

## 2024-01-09 ENCOUNTER — Encounter: Payer: Self-pay | Admitting: Gastroenterology

## 2024-01-11 NOTE — Progress Notes (Unsigned)
 01/11/2024 Connie Porter [id number] 1955/12/04   Chief Complaint: Discuss scheduling a colonoscopy   History of Present Illness: Connie Porter is a 68 year old female with a past medical history of hypertension, hyperlipidemia, GERD, hiatal hernia, C. diff and Crohn's disease. Previously followed by Dr. Teressa, now by Dr. Leigh.      Latest Ref Rng & Units 05/20/2023    4:36 PM 05/31/2022   10:36 AM 10/02/2021   10:19 AM  CBC  WBC 4.0 - 10.5 K/uL 6.1  6.3  4.1   Hemoglobin 12.0 - 15.0 g/dL 87.6  86.4  86.3   Hematocrit 36.0 - 46.0 % 37.4  41.0  40.5   Platelets 150.0 - 400.0 K/uL 291.0  292.0  294        Latest Ref Rng & Units 10/10/2023   10:26 AM 05/20/2023    4:36 PM 03/31/2023    1:53 PM  CMP  Glucose 70 - 99 mg/dL 85  93  91   BUN 8 - 27 mg/dL 19  16  21    Creatinine 0.57 - 1.00 mg/dL 9.16  9.18  9.08   Sodium 134 - 144 mmol/L 139  138  143   Potassium 3.5 - 5.2 mmol/L 4.8  4.2  4.5   Chloride 96 - 106 mmol/L 104  104  105   CO2 20 - 29 mmol/L 20  26  22    Calcium  8.7 - 10.3 mg/dL 9.9  8.9  8.8   Total Protein 6.0 - 8.5 g/dL 7.2  7.0    Total Bilirubin 0.0 - 1.2 mg/dL 0.3  0.3    Alkaline Phos 44 - 121 IU/L 49  55    AST 0 - 40 IU/L 21  20    ALT 0 - 32 IU/L 13  16      Colonoscopy 12/30/2022:  - Post-polypectomy scar in the cecum. Biopsied / removed as outlined above.  - Melanosis in the colon.  - Diverticulosis in the left colon.  - Anal papilla(e) were hypertrophied.  - The examination was otherwise normal. INAL DIAGNOSIS        1. Surgical [P], colon, cecum polyp/prior polypectomy scar site, polyp (1) :       SESSILE SERRATED POLYP (S) WITHOUT CYTOLOGIC DYSPLASIA.    Dear Connie Porter,   This message is to relay the results of your recent colonoscopy.  You were noted to have a large polypectomy site at the end of your colon that was rather difficult to visualize.  Removing the polyp in this location initially was technically challenging.   Unfortunately you did have some recurrence of some very flat polypoid tissue at the site.  I did my best to remove this during your recent procedure and feel that this was removed in entirety.  That being said this was technically challenging to do, and you are at risk for polyp recurrence at the site. Unfortunately, due to this reason I think we may need to do a colonoscopy again within the next year or so to reassess the site closely and make sure that has been definitively treated.   I would like to see you in the office in 6 months for reassessment of your Crohn's disease and we can discuss timing of your next colonoscopy at that time.  If you have any questions about this in the interim please do not hesitate to contact me.  Thanks.  Colonoscopy 06/28/22: - The perianal and digital rectal examinations were  normal.  - The terminal ileum appeared normal.  - A roughly 20 mm polyp was found in the cecum. The polyp was sessile and located just behind the IC valve, difficult to visualize en bloc. The polyp was removed with a piecemeal technique using a cold snare. Resection and retrieval were complete.  - Multiple medium-mouthed diverticula were found in the sigmoid colon.  - Anal papilla(e) were hypertrophied.  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  - The colon was quite tortous. The exam was otherwise without abnormality.  - Biopsies were taken with a cold forceps for histology in the right, left, and transverse colon.   1. Surgical [P], colon, cecum, polyp (1) - SESSILE SERRATED POLYP(S) WITHOUT CYTOLOGIC DYSPLASIA - MELANOSIS COLI 2. Surgical [P], right colon bx's - COLONIC MUCOSA WITH NO SPECIFIC HISTOPATHOLOGIC CHANGES - NEGATIVE FOR ACUTE INFLAMMATION, FEATURES OF CHRONICITY, GRANULOMAS OR DYSPLASIA - MELANOSIS COLI 3. Surgical [P], colon, transverse bx's - COLONIC MUCOSA WITH NO SPECIFIC HISTOPATHOLOGIC CHANGES - NEGATIVE FOR ACUTE INFLAMMATION, FEATURES OF  CHRONICITY, GRANULOMAS OR DYSPLASIA - MELANOSIS COLI 4. Surgical [P], left colon bx's - COLONIC MUCOSA WITH NO SPECIFIC HISTOPATHOLOGIC CHANGES - NEGATIVE FOR ACUTE INFLAMMATION, FEATURES OF CHRONICITY, GRANULOMAS OR DYSPLASIA - MELANOSIS COLI  a large polyp that I found at the end of your colon and was removed in piecemeal fashion. This polyp was determine to be sessile serrated, which is considered to be a pre-cancerous polyp. This means if it was never removed, it had a chance to grow into colon cancer. Moving forward, given the size of the polyp and how it was removed, I recommend another colonoscopy within the next year. I would like to see you back in the office in 6 months for reassessment and discuss this further with you. If you have any questions in the interim, please do not hesitate to contact me.    Colonoscopy 12/30/22: - The perianal and digital rectal examinations were normal.  - A large post polypectomy scar was found in the cecum. It was quite challenging to see the entire scar given it's location behind the IC valve. Initially this was a challenging polypectomy due to this reason. The IC valve was pleated back with snare tip - some subtle possible flat polypoid mucosa was noted in one portion along the scar. Unclear if this is normal granulation tissue or flat polypoid tissue. Cold snare was used and able to remove most of this area / scar. Cold forceps avulsion technique then used to remove some small areas of flat mucosa not able to be removed with snare. Following treatment the site did not appear to show any residual tissue at the scar.  - A diffuse area of mild melanosis was found in the entire colon.  - Multiple small-mouthed diverticula were found in the left colon.  - Anal papilla(e) were hypertrophied.  - The exam was otherwise without abnormality. Of note, residual stool noted throughout the colon. Extensive lavage done in the right colon which was most affected, to  achieve adequate views.   FINAL DIAGNOSIS        1. Surgical [P], colon, cecum polyp/prior polypectomy scar site, polyp (1) :       SESSILE SERRATED POLYP (S) WITHOUT CYTOLOGIC DYSPLASIA.  [name]    Past Medical History:  Diagnosis Date   Chronic left shoulder pain 06/07/2022   Crohn's disease [facility])    Dental crowns present    Encounter for annual physical exam 11/13/2022  GERD (gastroesophageal reflux disease)    High cholesterol    History of hiatal hernia    HTN (hypertension) 04/27/2018   Hyperlipidemia 01/13/2014   Osteoarthritis of knee, unilateral 09/2014   right    Patellofemoral arthritis of right knee 10/07/2014   Seasonal allergies    Past Surgical History:  Procedure Laterality Date   APPENDECTOMY     CESAREAN SECTION     x 3   CHOLECYSTECTOMY     COLONOSCOPY     ESOPHAGOGASTRODUODENOSCOPY (EGD) WITH ESOPHAGEAL DILATION     x 2   JOINT REPLACEMENT  07/11/2022   Shoulder replacement   PATELLA-FEMORAL ARTHROPLASTY Right 10/07/2014   Procedure: RIGHT PATELLA-FEMORAL ARTHROPLASTY;  Surgeon: Fonda Olmsted, MD;  Location: [facility];  Service: Orthopedics;  Laterality: Right;   SHOULDER SURGERY Left    TONSILLECTOMY AND ADENOIDECTOMY     TUBAL LIGATION  O8/13/1991   WISDOM TOOTH EXTRACTION         Current Medications, Allergies, Past Medical History, Past Surgical History, Family History and Social History were reviewed in [employer] record.   Review of Systems:   Constitutional: Negative for fever, sweats, chills or weight loss.  Respiratory: Negative for shortness of breath.   Cardiovascular: Negative for chest pain, palpitations and leg swelling.  Gastrointestinal: See HPI.  Musculoskeletal: Negative for back pain or muscle aches.  Neurological: Negative for dizziness, headaches or paresthesias.    Physical Exam: There were no vitals taken for this visit. General: in no acute distress. Head: Normocephalic and  atraumatic. Eyes: No scleral icterus. Conjunctiva pink . Ears: Normal auditory acuity. Mouth: Dentition intact. No ulcers or lesions.  Lungs: Clear throughout to auscultation. Heart: Regular rate and rhythm, no murmur. Abdomen: Soft, nontender and nondistended. No masses or hepatomegaly. Normal bowel sounds x 4 quadrants.  Rectal: Deferred.  Musculoskeletal: Symmetrical with no gross deformities. Extremities: No edema. Neurological: Alert oriented x 4. No focal deficits.  Psychological: Alert and cooperative. Normal mood and affect  Assessment and Recommendations: ***

## 2024-01-12 ENCOUNTER — Ambulatory Visit (INDEPENDENT_AMBULATORY_CARE_PROVIDER_SITE_OTHER): Admitting: Nurse Practitioner

## 2024-01-12 ENCOUNTER — Encounter: Payer: Self-pay | Admitting: Nurse Practitioner

## 2024-01-12 VITALS — BP 122/80 | HR 84 | Ht 65.75 in | Wt 210.0 lb

## 2024-01-12 DIAGNOSIS — Z860101 Personal history of adenomatous and serrated colon polyps: Secondary | ICD-10-CM | POA: Diagnosis not present

## 2024-01-12 DIAGNOSIS — K5080 Crohn's disease of both small and large intestine without complications: Secondary | ICD-10-CM

## 2024-01-12 MED ORDER — SUTAB 1479-225-188 MG PO TABS: ORAL_TABLET | ORAL | 0 refills | Status: DC

## 2024-01-12 NOTE — Patient Instructions (Addendum)
 _______________________________________________________  If your blood pressure at your visit was 140/90 or greater, please contact your primary care physician to follow up on this.  _______________________________________________________  If you are age 69 or older, your body mass index should be between 23-30. Your Body mass index is 34.15 kg/m. If this is out of the aforementioned range listed, please consider follow up with your Primary Care Provider.  If you are age 27 or younger, your body mass index should be between 19-25. Your Body mass index is 34.15 kg/m. If this is out of the aformentioned range listed, please consider follow up with your Primary Care Provider.   ________________________________________________________  The [facility] providers would like to encourage you to use MYCHART to communicate with providers for non-urgent requests or questions.  Due to long hold times on the telephone, sending your provider a message by [facility] may be a faster and more efficient way to get a response.  Please allow 48 business hours for a response.  Please remember that this is for non-urgent requests.  _______________________________________________________  [facility] is using a team-based approach to care.  Your team is made up of your doctor and two to three APPS. Our APPS (Nurse Practitioners and Physician Assistants) work with your physician to ensure care continuity for you. They are fully qualified to address your health concerns and develop a treatment plan. They communicate directly with your gastroenterologist to care for you. Seeing the Advanced Practice Practitioners on your physician's team can help you by facilitating care more promptly, often allowing for earlier appointments, access to diagnostic testing, procedures, and other specialty referrals.   Take Miralax  1 capful at bedtime each night 7 nights prior to colonoscopy prep date  You have been scheduled for  a colonoscopy. Please follow written instructions given to you at your visit today.   If you use inhalers (even only as needed), please bring them with you on the day of your procedure.  DO NOT TAKE 7 DAYS PRIOR TO TEST- Trulicity (dulaglutide) Ozempic, Wegovy (semaglutide) Mounjaro  (tirzepatide ) Bydureon Bcise (exanatide extended release)  DO NOT TAKE 1 DAY PRIOR TO YOUR TEST Rybelsus (semaglutide) Adlyxin (lixisenatide) Victoza (liraglutide) Byetta (exanatide) ___________________________________________________________________________   Due to recent changes in healthcare laws, you may see the results of your imaging and laboratory studies on MyChart before your provider has had a chance to review them.  We understand that in some cases there may be results that are confusing or concerning to you. Not all laboratory results come back in the same time frame and the provider may be waiting for multiple results in order to interpret others.  Please give us  48 hours in order for your provider to thoroughly review all the results before contacting the office for clarification of your results.   Thank you for entrusting me with your care and choosing [facility].  Elida Shawl, NP

## 2024-01-13 ENCOUNTER — Encounter: Payer: Self-pay | Admitting: Nurse Practitioner

## 2024-01-13 NOTE — Progress Notes (Signed)
 Agree with assessment plan as outlined.  Hopefully if no residual/recurrent polyp tissue at this site then we can space out her next exam a bit longer

## 2024-01-14 NOTE — Progress Notes (Signed)
 Referring Physician:  Oris Camie BRAVO, NP [address] [place],  [place] [address]  Primary Physician:  Oris Camie BRAVO, NP  History of Present Illness: 01/19/2024 [name] is here today with a chief complaint of back pain and right lower extremity pain.  She has been dealing with this over over a year.  States that she has had some significant back spasms at the beginning of this issue and developed pain going down her right lower extremity.  She had multiple different avenues of work up including both spine surgery and orthopedic surgery.  While being worked up in orthopedic surgery she was found to have a large Baker's cyst which was drained.  There is some concern that this may be causing a tibial neuropathy.  She underwent an EMG and nerve conduction study approximately 10 months ago which showed a chronic right sided S1 radiculopathy.  She continues to have worsening weakness numbness and tingling and pain going down her right lower extremity.  She has worsening back pain as well.  She feels like it is getting harder to walk.  Having more sensitivity in the bottom of her foot as well as numbness in the bottom of her foot.   She has tried gabapentin, has not yet had physical therapy.  Review of Systems:  A 10 point review of systems is negative, except for the pertinent positives and negatives detailed in the HPI.  Past Medical History: Past Medical History:  Diagnosis Date   Chronic left shoulder pain 06/07/2022   Crohn's disease (HCC)    Dental crowns present    Encounter for annual physical exam 11/13/2022   GERD (gastroesophageal reflux disease)    High cholesterol    History of hiatal hernia    HTN (hypertension) 04/27/2018   Hyperlipidemia 01/13/2014   Osteoarthritis of knee, unilateral 09/2014   right    Patellofemoral arthritis of right knee 10/07/2014   Seasonal allergies     Past Surgical History: Past Surgical History:  Procedure Laterality Date    APPENDECTOMY     CESAREAN SECTION     x 3   CHOLECYSTECTOMY     COLONOSCOPY     ESOPHAGOGASTRODUODENOSCOPY (EGD) WITH ESOPHAGEAL DILATION     x 2   JOINT REPLACEMENT  07/11/2022   Shoulder replacement   PATELLA-FEMORAL ARTHROPLASTY Right 10/07/2014   Procedure: RIGHT PATELLA-FEMORAL ARTHROPLASTY;  Surgeon: Fonda Olmsted, MD;  Location: [facility];  Service: Orthopedics;  Laterality: Right;   SHOULDER SURGERY Left    TONSILLECTOMY AND ADENOIDECTOMY     TUBAL LIGATION  O8/13/1991   WISDOM TOOTH EXTRACTION      Allergies: Allergies as of 01/19/2024   (No Known Allergies)    Medications:  Current Outpatient Medications:    adalimumab  (HUMIRA , 2 PEN,) 80 MG/0.8ML pen, Inject 0.8 mLs (80 mg total) into the skin every 14 (fourteen) days., Disp: 2 each, Rfl: 6   CALCIUM  CITRATE PO, Take 1 tablet by mouth 2 (two) times daily., Disp: , Rfl:    Cholecalciferol (VITAMIN D -3 PO), Take 1 capsule by mouth at bedtime., Disp: , Rfl:    Cyanocobalamin (VITAMIN B-12 PO), Take 1 tablet by mouth daily., Disp: , Rfl:    cyclobenzaprine (FLEXERIL) 5 MG tablet, Take 5 mg by mouth at bedtime as needed., Disp: , Rfl:    dicyclomine  (BENTYL ) 10 MG capsule, Take 1 capsule (10 mg total) by mouth every 8 (eight) hours as needed for spasms., Disp: 30 capsule, Rfl: 1  DYMISTA  137-50 MCG/ACT SUSP, Spray 1 spray twice a day by intranasal route., Disp: , Rfl:    ezetimibe  (ZETIA ) 10 MG tablet, Take 1 tablet (10 mg total) by mouth daily., Disp: 90 tablet, Rfl: 3   fexofenadine (ALLEGRA) 180 MG tablet, Take 180 mg by mouth daily., Disp: , Rfl:    gabapentin (NEURONTIN) 300 MG capsule, Take 300 mg by mouth 3 (three) times daily., Disp: , Rfl:    Iron-Folic Acid-Vit B12 (IRON FORMULA PO), Take by mouth., Disp: , Rfl:    levocetirizine (XYZAL ) 5 MG tablet, Take 5 mg by mouth every evening., Disp: , Rfl:    montelukast  (SINGULAIR ) 10 MG tablet, Take 10 mg by mouth at bedtime., Disp: , Rfl:     Multiple Vitamin (MULITIVITAMIN WITH MINERALS) TABS, Take 1 tablet by mouth 2 (two) times daily., Disp: , Rfl:    Olopatadine-Mometasone (RYALTRIS) 665-25 MCG/ACT SUSP, 2 puffs in each nostril Nasally Twice a day; Duration: 30 days, Disp: , Rfl:    Omega-3 Fatty Acids (FISH OIL PO), Take 2 capsules by mouth 2 (two) times daily. , Disp: , Rfl:    rosuvastatin  (CRESTOR ) 20 MG tablet, TAKE 1 TABLET(20 MG) BY MOUTH DAILY, Disp: 90 tablet, Rfl: 3   Sodium Sulfate-Mag Sulfate-KCl (SUTAB ) [phone] MG TABS, Use as directed for colonoscopy. MANUFACTURER CODES!! BIN: [id number] PCN: CN GROUP: [id number] MEMBER ID: [id number];MLW AS SECONDARY INSURANCE ;NO PRIOR AUTHORIZATION, Disp: 24 tablet, Rfl: 0   tirzepatide  (ZEPBOUND ) 10 MG/0.5ML Pen, Inject 10 mg into the skin once a week. Start after completing 4 weeks on 7.5mg , Disp: 6 mL, Rfl: 1   valsartan  (DIOVAN ) 80 MG tablet, Take 1 tablet (80 mg total) by mouth daily., Disp: 90 tablet, Rfl: 3  Social History: Social History   Tobacco Use   Smoking status: Never   Smokeless tobacco: Never  Vaping Use   Vaping status: Never Used  Substance Use Topics   Alcohol use: Yes    Alcohol/week: 4.0 standard drinks of alcohol    Types: 4 Glasses of wine per week    Comment: weekends   Drug use: No    Family Medical History: Family History  Problem Relation Age of Onset   Arthritis Mother    Dementia Mother    Heart disease Father 57   Hyperlipidemia Father    Hypertension Father    Heart attack Father    Heart disease Brother 6       MI   Parkinson's disease Brother    Stroke Maternal Grandfather    Asthma Son    Colon cancer Neg Hx    Esophageal cancer Neg Hx    Pancreatic cancer Neg Hx    Stomach cancer Neg Hx    Liver disease Neg Hx    Rectal cancer Neg Hx     Physical Examination: Vitals:   01/19/24 0951  BP: 130/86   NEUROLOGICAL:     Awake, alert, oriented to person, place, and time.  Speech is clear and fluent.   Cranial  Nerves: Pupils equal round and reactive to light.  Facial tone is symmetric. Shoulder shrug is symmetric. Tongue protrusion is midline.  There is no pronator drift.  Motor Exam:  LUMBAR PLEXUS NERVE AND MUSCLE EXAMINATION        DATE 01/19/2024       Side Inovlved Right lower extremity       Examiner BWS     NERVE ROOTS MUSCLE      Femoral  L1,L2,L3 Illiopsoas  5      L2,L3,L4 Quadriceps 5     Obturator L2,L3,L4 Thigh Adductors 5     Superior Gluteal L4,L5,S1 Gluteus Minimus Medius     4     Inferior Gluteal L5,S1,S2 Gluteus Maximus 4+     Sciatic L5,S1,S2 Hamstrings 4     Tibial S1,S2 Gastrocnemius Soleus         4      L4,L5 Tibialis Posterior  4+      L5,S1,S2 Flexor Digitorum Longus+   4      S1,S2 Foot Intrinsics 4     Deep Peroneal L4,L5 Tibialis Anterior 4+      L5,S1  Extensor Digitorum Longus  4      L5,S2 Extensor Hallucis Longus   4     Superficial Peroneal L5,S1 Peroneus Longus Brevis     4        Reflexes are decreased at the right medial hamstring as well as the right Achilles.  Intact at the right patella and left lower extremity.  Decreased sensation in the right S1 distribution.  Mild hip drop    Medical Decision Making  Imaging:  Multilevel spondylosis, this level is at the L5-S1 level looks like there is likely a previous L5-S1 synovial cyst with abutment of the right S1 nerve root.  This could be the cause of the chronic radiculopathy.  There is also a synovial cyst in the left L4-5 foramen which is likely inconsequential.   Electrodiagnostics:  [facility]  [address] [place], [address]  [place], [place] [address] Tel: [phone] Fax: [phone] Test Date:  03/28/2023   Patient: Casie Sturgeon DOB: 11-16-1955 Physician: Tonita Blanch, DO  Sex: Female Height: 5' 7 Ref Phys: Fonda Olmsted, MD  ID#: [id number]     Technician:      History: This is a 68 year old female referred for evaluation of right foot numbness.   NCV & EMG  Findings: Extensive electrodiagnostic testing of the right lower extremity shows:  Right sural and superficial peroneal sensory responses are within normal limits. Right peroneal motor responses within normal limits.  Right tibial motor response is absent. Right tibial H reflex study shows prolonged latency. Chronic motor axonal loss changes are seen affecting the S1 myotome, without accompanying active denervation.   Impression: Chronic S1 radiculopathy affecting the right lower extremity, moderate. There is no evidence of a large fiber sensorimotor polyneuropathy.     ___________________________ Tonita Blanch, DO    I have personally reviewed the images and electrodiagnostics and agree with the above interpretation.  Assessment and Plan: Ms. Plourde is a pleasant 68 y.o. female with significant back pain and right lower extremity pain.  She has had multiple workups including spine and orthopedic surgery.  She had an EMG nerve conduction study which showed a right sided chronic S1 radiculopathy.  She was seen by neurosurgery who did not think that the spine imaging was concordant with the presentation as they noticed a left-sided L4-5 cyst.  She was referred to orthopedic surgery as well and was found to have a large Baker's cyst which was drained.  Had continued numbness in the bottom of her foot.  On physical examination she has positive straight leg raise, and then weakness noted mostly in the L5/S1 myotome.  She has tibial symptoms as noted but also has some peroneal innervated musculature with weakness as well as some sciatic weakness noted at the hamstrings level.  She has decreased medial hamstring reflex as well  as decreased Achilles reflex.  She has decreased sensation mostly in an S1 distribution.  She does not have any Tinel sign at the tibial nerve location.  I think she is likely experiencing a double crush type phenomenon.  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  Because her exam findings continue to worsen I would like to get a repeat MRI to see if this is worsening.  She had a large Baker's cyst at the posterior knee/popliteal fossa which could have caused a compression type injury at the level of the tibial nerve in the leg.  Given the likely irritation of the nerve up at the level of the spine as well as at the level of the leg she likely is experiencing excess tibial symptoms.  I would like to repeat her MRI, would like to follow-up with the physical therapy and flexion-extension x-rays of her low back to evaluate for any instability.  I like to see her after these testings have been performed.  At that point we will be able to discuss a plan going forward.  Thank you for involving me in the care of this patient.    Penne MICAEL Sharps MD/MSCR Neurosurgery - Peripheral Nerve Surgery

## 2024-01-19 ENCOUNTER — Ambulatory Visit
Admission: RE | Admit: 2024-01-19 | Discharge: 2024-01-19 | Disposition: A | Discharge status: Home / Self Care | Attending: Neurosurgery | Admitting: Neurosurgery

## 2024-01-19 ENCOUNTER — Encounter: Payer: Self-pay | Admitting: Neurosurgery

## 2024-01-19 ENCOUNTER — Inpatient Hospital Stay
Admission: RE | Admit: 2024-01-19 | Discharge: 2024-01-19 | Disposition: A | Payer: Self-pay | Source: Ambulatory Visit | Attending: Neurosurgery | Admitting: Neurosurgery

## 2024-01-19 ENCOUNTER — Other Ambulatory Visit: Payer: Self-pay

## 2024-01-19 ENCOUNTER — Ambulatory Visit: Admitting: Neurosurgery

## 2024-01-19 ENCOUNTER — Ambulatory Visit
Admission: RE | Admit: 2024-01-19 | Discharge: 2024-01-19 | Disposition: A | Discharge status: Home / Self Care | Source: Ambulatory Visit | Attending: Neurosurgery | Admitting: Neurosurgery

## 2024-01-19 VITALS — BP 130/86 | Ht 67.0 in | Wt 209.8 lb

## 2024-01-19 DIAGNOSIS — M4727 Other spondylosis with radiculopathy, lumbosacral region: Secondary | ICD-10-CM

## 2024-01-19 DIAGNOSIS — X58XXXS Exposure to other specified factors, sequela: Secondary | ICD-10-CM | POA: Diagnosis not present

## 2024-01-19 DIAGNOSIS — R292 Abnormal reflex: Secondary | ICD-10-CM | POA: Diagnosis not present

## 2024-01-19 DIAGNOSIS — M5441 Lumbago with sciatica, right side: Secondary | ICD-10-CM | POA: Insufficient documentation

## 2024-01-19 DIAGNOSIS — Z6834 Body mass index (BMI) 34.0-34.9, adult: Secondary | ICD-10-CM | POA: Diagnosis not present

## 2024-01-19 DIAGNOSIS — I700 Atherosclerosis of aorta: Secondary | ICD-10-CM | POA: Insufficient documentation

## 2024-01-19 DIAGNOSIS — M7138 Other bursal cyst, other site: Secondary | ICD-10-CM

## 2024-01-19 DIAGNOSIS — M4316 Spondylolisthesis, lumbar region: Secondary | ICD-10-CM | POA: Diagnosis not present

## 2024-01-19 DIAGNOSIS — G8929 Other chronic pain: Secondary | ICD-10-CM

## 2024-01-19 DIAGNOSIS — S8401XS Injury of tibial nerve at lower leg level, right leg, sequela: Secondary | ICD-10-CM

## 2024-01-19 DIAGNOSIS — Z1231 Encounter for screening mammogram for malignant neoplasm of breast: Secondary | ICD-10-CM | POA: Diagnosis not present

## 2024-01-19 DIAGNOSIS — Z01419 Encounter for gynecological examination (general) (routine) without abnormal findings: Secondary | ICD-10-CM | POA: Diagnosis not present

## 2024-01-19 NOTE — Patient Instructions (Signed)

## 2024-01-19 NOTE — Addendum Note (Signed)
 Addended by: GIRARD DON GAILS on: 01/19/2024 03:10 PM   Modules accepted: Orders

## 2024-01-26 ENCOUNTER — Ambulatory Visit: Payer: Self-pay | Admitting: Neurosurgery

## 2024-01-26 ENCOUNTER — Telehealth: Payer: Self-pay | Admitting: Nurse Practitioner

## 2024-01-26 NOTE — Telephone Encounter (Signed)
 Inbound call from patient stating her insurance is requesting an urgent prior authorization for Humira  injection.  Please advise  Thank you

## 2024-01-27 ENCOUNTER — Telehealth: Payer: Self-pay

## 2024-01-27 ENCOUNTER — Other Ambulatory Visit (HOSPITAL_COMMUNITY): Payer: Self-pay

## 2024-01-27 NOTE — Telephone Encounter (Signed)
 Pharmacy Patient Advocate Encounter   Received notification from Pt Calls Messages that prior authorization for Humira  (2 Pen) (CF) 80MG /0.8ML auto-injector kit is required/requested.   Insurance verification completed.   The patient is insured through [employer].   Per test claim: PA required; PA submitted to above mentioned insurance via Latent Key/confirmation #/EOC [id number] Status is pending

## 2024-01-27 NOTE — Telephone Encounter (Signed)
 Pharmacy Patient Advocate Encounter  Received notification from [facility] [employer] that Prior Authorization for Humira  (2 Pen) (CF) 80MG /0.8ML auto-injector kit has been APPROVED from 01-27-2024 to 01-26-2025. Ran test claim, Copay is $60.00. This test claim was processed through [facility]- copay amounts may vary at other pharmacies due to pharmacy/plan contracts, or as the patient moves through the different stages of their insurance plan.   PA #/Case ID/Reference #: [id number]

## 2024-01-28 ENCOUNTER — Ambulatory Visit
Admission: RE | Admit: 2024-01-28 | Discharge: 2024-01-28 | Disposition: A | Source: Ambulatory Visit | Attending: Neurosurgery | Admitting: Neurosurgery

## 2024-01-28 DIAGNOSIS — S8401XS Injury of tibial nerve at lower leg level, right leg, sequela: Secondary | ICD-10-CM

## 2024-01-28 DIAGNOSIS — G8929 Other chronic pain: Secondary | ICD-10-CM

## 2024-02-05 ENCOUNTER — Other Ambulatory Visit: Payer: Self-pay

## 2024-02-05 ENCOUNTER — Telehealth: Payer: Self-pay | Admitting: Gastroenterology

## 2024-02-05 MED ORDER — HUMIRA (2 PEN) 80 MG/0.8ML ~~LOC~~ AJKT: 80.0000 mg | AUTO-INJECTOR | SUBCUTANEOUS | 6 refills | Status: DC

## 2024-02-05 NOTE — Telephone Encounter (Signed)
 Inbound call from [place] from [facility] stating they have not received prescription for patient. Please advise Thank you

## 2024-02-05 NOTE — Telephone Encounter (Signed)
 Inbound call from patient stating her Humira  Injections now has to go through different pharmacy to be covered by her insurance. Patient stated she is overdue for medication  Pharmacy is called [facility] [phone] Please advise  Thank you

## 2024-02-05 NOTE — Telephone Encounter (Signed)
 Verbal script called in for Humira  80mg /.4ml 2 pens, inject 80mg  [place] every 14 days. RFx6

## 2024-02-05 NOTE — Telephone Encounter (Signed)
 Prescription has been sent to pharmacy as requested. Also sent to PA team.

## 2024-02-06 ENCOUNTER — Ambulatory Visit (INDEPENDENT_AMBULATORY_CARE_PROVIDER_SITE_OTHER): Admitting: Neurosurgery

## 2024-02-06 VITALS — BP 118/70 | Wt 212.0 lb

## 2024-02-06 DIAGNOSIS — R200 Anesthesia of skin: Secondary | ICD-10-CM

## 2024-02-06 DIAGNOSIS — S8401XS Injury of tibial nerve at lower leg level, right leg, sequela: Secondary | ICD-10-CM

## 2024-02-06 DIAGNOSIS — M79604 Pain in right leg: Secondary | ICD-10-CM | POA: Diagnosis not present

## 2024-02-06 DIAGNOSIS — G8929 Other chronic pain: Secondary | ICD-10-CM

## 2024-02-06 DIAGNOSIS — M5417 Radiculopathy, lumbosacral region: Secondary | ICD-10-CM | POA: Diagnosis not present

## 2024-02-06 DIAGNOSIS — M792 Neuralgia and neuritis, unspecified: Secondary | ICD-10-CM | POA: Insufficient documentation

## 2024-02-06 MED ORDER — GABAPENTIN 300 MG PO CAPS: 300.0000 mg | ORAL_CAPSULE | Freq: Three times a day (TID) | ORAL | 2 refills | Status: DC

## 2024-02-06 NOTE — Progress Notes (Signed)
 Referring Physician:  Oris Camie BRAVO, NP [address] [place],  [place] [address]  Primary Physician:  Oris Camie BRAVO, NP  History of Present Illness: 02/06/2024 [name] is here today to follow-up from her lumbar stenosis and lower extremity pain.  She has recently started her physical therapy.  She has not yet seen any improvement as expected.  She will continue to plan on working with them.  She is here today to follow-up on her imaging.   Review of Systems:  A 10 point review of systems is negative, except for the pertinent positives and negatives detailed in the HPI.  Past Medical History: Past Medical History:  Diagnosis Date   Chronic left shoulder pain 06/07/2022   Crohn's disease (HCC)    Dental crowns present    Encounter for annual physical exam 11/13/2022   GERD (gastroesophageal reflux disease)    High cholesterol    History of hiatal hernia    HTN (hypertension) 04/27/2018   Hyperlipidemia 01/13/2014   Osteoarthritis of knee, unilateral 09/2014   right    Patellofemoral arthritis of right knee 10/07/2014   Seasonal allergies     Past Surgical History: Past Surgical History:  Procedure Laterality Date   APPENDECTOMY     CESAREAN SECTION     x 3   CHOLECYSTECTOMY     COLONOSCOPY     ESOPHAGOGASTRODUODENOSCOPY (EGD) WITH ESOPHAGEAL DILATION     x 2   JOINT REPLACEMENT  07/11/2022   Shoulder replacement   PATELLA-FEMORAL ARTHROPLASTY Right 10/07/2014   Procedure: RIGHT PATELLA-FEMORAL ARTHROPLASTY;  Surgeon: Fonda Olmsted, MD;  Location: [facility];  Service: Orthopedics;  Laterality: Right;   SHOULDER SURGERY Left    TONSILLECTOMY AND ADENOIDECTOMY     TUBAL LIGATION  O8/13/1991   WISDOM TOOTH EXTRACTION      Allergies: Allergies as of 02/06/2024   (No Known Allergies)    Medications:  Current Outpatient Medications:    adalimumab  (HUMIRA , 2 PEN,) 80 MG/0.8ML pen, Inject 0.8 mLs (80 mg total) into the skin  every 14 (fourteen) days., Disp: 2 each, Rfl: 6   CALCIUM  CITRATE PO, Take 1 tablet by mouth 2 (two) times daily., Disp: , Rfl:    Cholecalciferol (VITAMIN D -3 PO), Take 1 capsule by mouth at bedtime., Disp: , Rfl:    Cyanocobalamin (VITAMIN B-12 PO), Take 1 tablet by mouth daily., Disp: , Rfl:    cyclobenzaprine (FLEXERIL) 5 MG tablet, Take 5 mg by mouth at bedtime as needed., Disp: , Rfl:    dicyclomine  (BENTYL ) 10 MG capsule, Take 1 capsule (10 mg total) by mouth every 8 (eight) hours as needed for spasms., Disp: 30 capsule, Rfl: 1   DYMISTA  137-50 MCG/ACT SUSP, Spray 1 spray twice a day by intranasal route., Disp: , Rfl:    ezetimibe  (ZETIA ) 10 MG tablet, Take 1 tablet (10 mg total) by mouth daily., Disp: 90 tablet, Rfl: 3   Ferrous Sulfate 5 MG/20ML SOLN, Take by mouth daily., Disp: , Rfl:    fexofenadine (ALLEGRA) 180 MG tablet, Take 180 mg by mouth daily., Disp: , Rfl:    gabapentin (NEURONTIN) 300 MG capsule, Take 300 mg by mouth 3 (three) times daily., Disp: , Rfl:    Iron-Folic Acid-Vit B12 (IRON FORMULA PO), Take by mouth., Disp: , Rfl:    levocetirizine (XYZAL ) 5 MG tablet, Take 5 mg by mouth every evening., Disp: , Rfl:    montelukast  (SINGULAIR ) 10 MG tablet, Take 10 mg by mouth  at bedtime., Disp: , Rfl:    Multiple Vitamin (MULITIVITAMIN WITH MINERALS) TABS, Take 1 tablet by mouth 2 (two) times daily., Disp: , Rfl:    Olopatadine-Mometasone (RYALTRIS) 665-25 MCG/ACT SUSP, 2 puffs in each nostril Nasally Twice a day; Duration: 30 days, Disp: , Rfl:    Omega-3 Fatty Acids (FISH OIL PO), Take 2 capsules by mouth 2 (two) times daily. , Disp: , Rfl:    rosuvastatin  (CRESTOR ) 20 MG tablet, TAKE 1 TABLET(20 MG) BY MOUTH DAILY, Disp: 90 tablet, Rfl: 3   Sodium Sulfate-Mag Sulfate-KCl (SUTAB ) [phone] MG TABS, Use as directed for colonoscopy. MANUFACTURER CODES!! BIN: [id number] PCN: CN GROUP: [id number] MEMBER ID: [id number];MLW AS SECONDARY INSURANCE ;NO PRIOR AUTHORIZATION, Disp: 24  tablet, Rfl: 0   tirzepatide  (ZEPBOUND ) 10 MG/0.5ML Pen, Inject 10 mg into the skin once a week. Start after completing 4 weeks on 7.5mg , Disp: 6 mL, Rfl: 1   valsartan  (DIOVAN ) 80 MG tablet, Take 1 tablet (80 mg total) by mouth daily., Disp: 90 tablet, Rfl: 3  Social History: Social History   Tobacco Use   Smoking status: Never   Smokeless tobacco: Never  Vaping Use   Vaping status: Never Used  Substance Use Topics   Alcohol use: Yes    Alcohol/week: 4.0 standard drinks of alcohol    Types: 4 Glasses of wine per week    Comment: weekends   Drug use: No    Family Medical History: Family History  Problem Relation Age of Onset   Arthritis Mother    Dementia Mother    Heart disease Father 83   Hyperlipidemia Father    Hypertension Father    Heart attack Father    Heart disease Brother 61       MI   Parkinson's disease Brother    Stroke Maternal Grandfather    Asthma Son    Colon cancer Neg Hx    Esophageal cancer Neg Hx    Pancreatic cancer Neg Hx    Stomach cancer Neg Hx    Liver disease Neg Hx    Rectal cancer Neg Hx     Physical Examination: Vitals:   02/06/24 0956  BP: 118/70   NEUROLOGICAL:     Awake, alert, oriented to person, place, and time.  Speech is clear and fluent.   Cranial Nerves: Pupils equal round and reactive to light.  Facial tone is symmetric. Shoulder shrug is symmetric. Tongue protrusion is midline.  There is no pronator drift.  Motor Exam:  LUMBAR PLEXUS NERVE AND MUSCLE EXAMINATION        DATE 01/19/2024       Side Inovlved Right lower extremity       Examiner BWS     NERVE ROOTS MUSCLE      Femoral  L1,L2,L3 Illiopsoas 5      L2,L3,L4 Quadriceps 5     Obturator L2,L3,L4 Thigh Adductors 5     Superior Gluteal L4,L5,S1 Gluteus Minimus Medius     4     Inferior Gluteal L5,S1,S2 Gluteus Maximus 4+     Sciatic L5,S1,S2 Hamstrings 4     Tibial S1,S2 Gastrocnemius Soleus         4      L4,L5 Tibialis Posterior  4+      L5,S1,S2 Flexor  Digitorum Longus+   4      S1,S2 Foot Intrinsics 4     Deep Peroneal L4,L5 Tibialis Anterior 4+      L5,S1  Extensor Digitorum Longus  4      L5,S2 Extensor Hallucis Longus   4     Superficial Peroneal L5,S1 Peroneus Longus Brevis     4        Reflexes are decreased at the right medial hamstring as well as the right Achilles.  Intact at the right patella and left lower extremity.  Decreased sensation in the right S1 distribution.  Mild hip drop    Medical Decision Making  Imaging: Narrative & Impression  CLINICAL DATA:  Evaluate for excess motion., AP, lateral, flexion extension   EXAM: LUMBAR SPINE - COMPLETE 4+ VIEW   COMPARISON:  MRI outside hospital 04/08/2023   FINDINGS: There is no evidence of lumbar spine fracture. Grade 1 anterolisthesis of L4 on L5. No change in positioning on flexion extension views. Multilevel mild moderate facet arthropathy. No severe osseous neural foraminal or central canal stenosis. L5-S1 intervertebral disc space narrowing.   Atherosclerotic plaque. Right upper quadrant surgical clips. Bilateral pelvic staples consistent with tubal ligation.   Atherosclerotic plaque.   IMPRESSION: 1. No acute displaced fracture or traumatic listhesis of the lumbar spine. 2. Grade 1 anterolisthesis of L4 on L5. No change in positioning on flexion extension views. 3.  Aortic Atherosclerosis (ICD10-I70.0).     Electronically Signed   By: Morgane  Naveau M.D.   On: 01/22/2024 22:00     Narrative & Impression  EXAM: MRI LUMBAR SPINE 01/28/2024 09:44:11 AM   TECHNIQUE: Multiplanar multisequence MRI of the lumbar spine was performed without the administration of intravenous contrast.   COMPARISON: Prior MRI from 04/08/2023.   CLINICAL HISTORY: Worsening right lower extremity radiculopathy, previously known spondylosis and facet arthropathy. Chronic bilateral low back pain with right-sided sciatica. No surgery. No injury. No cancer.    FINDINGS:   BONES AND ALIGNMENT: Standard segmentation. Lowest well formed disc space labeled the L5-S1 level. 2 mm facet mediastinal anterolisthesis of L4 on L5. Trace degenerative retrolisthesis of T12 on L1. Chronic compression deformity involving the T10 vertebral body without retropulsion. Vertebral body height otherwise maintained. Bone marrow signal intensity within normal limits. Mild degenerative reactive endplate change with marrow edema present about the L3-L4 interspace. Minimal reactive endplate changes noted at T12-L1.   SPINAL CORD: The conus terminates at the L1 level.   SOFT TISSUES: No paraspinal mass.   T9-T10: Mild noncompressive disc bulging and endplate spurring. No significant stenosis.   T10-T11: Mild noncompressive disc bulging and endplate spurring. No significant stenosis.   T11-T12: Mild noncompressive disc bulging and endplate spurring. No significant stenosis.   T12-L1: Mild noncompressive disc bulging and endplate spurring. No significant stenosis.   L1-L2: Disc desiccation with minimal disc bulge. Mild facet spurring. No stenosis.   L2-L3: Mild disc bulge with disc desiccation. Moderate left greater than right facet hypertrophy. No significant spinal stenosis. Foramina are patent.   L3-L4: Degenerative vertebral disc space narrowing with diffuse disc bulge and disc desiccation. Reactive endplate spurring. Superimposed right foraminal to extraforaminal disc protrusion closely approximates the exiting right L3 nerve root (series 306, image 23). Moderate right worse than left facet hypertrophy. No significant spinal stenosis. Moderate right with mild left foraminal narrowing.   L4-L5: Disc desiccation with mild disc bulge. Advanced bilateral facet arthrosis with small joint effusions. No significant spinal stenosis. Mild to moderate left with mild right L4 foraminal narrowing.   L5-S1: Disc desiccation with mild disc bulge. Reactive  endplate spurring on the left. Moderate right worse than left facet hypertrophy. No spinal stenosis. Moderate left L5 foraminal  narrowing.   IMPRESSION: 1. L3-4: Right foraminal to extraforaminal disc protrusion closely approximating and potentially affecting the exiting right L3 nerve root. Moderate right foraminal narrowing at this level. 2. Additional relatively mild for age multilevel degenerative disc disease as above. No significant stenosis or vertebral impingement. 3. Moderate to advanced lower lumbar facet hypertrophy most pronounced at L4-L5. 4. Chronic T10 compression fracture.   Electronically signed by: Morene Hoard MD 01/28/2024 08:52 PM EDT RP Workstation: [id number]    Electrodiagnostics:  [facility]  [address] [place], [address]  [place], [place] [address] Tel: [phone] Fax: [phone] Test Date:  03/28/2023   Patient: Connie Porter DOB: 24-Feb-1956 Physician: Tonita Blanch, DO  Sex: Female Height: 5' 7 Ref Phys: Fonda Olmsted, MD  ID#: [id number]     Technician:      History: This is a 68 year old female referred for evaluation of right foot numbness.   NCV & EMG Findings: Extensive electrodiagnostic testing of the right lower extremity shows:  Right sural and superficial peroneal sensory responses are within normal limits. Right peroneal motor responses within normal limits.  Right tibial motor response is absent. Right tibial H reflex study shows prolonged latency. Chronic motor axonal loss changes are seen affecting the S1 myotome, without accompanying active denervation.   Impression: Chronic S1 radiculopathy affecting the right lower extremity, moderate. There is no evidence of a large fiber sensorimotor polyneuropathy.     ___________________________ Tonita Blanch, DO    I have personally reviewed the images and electrodiagnostics and agree with the above interpretation.  Assessment and Plan: Ms. Standre is a pleasant  68 y.o. female with significant back pain and right lower extremity pain.  She has had multiple workups including spine and orthopedic surgery.  She had an EMG nerve conduction study which showed a right sided chronic S1 radiculopathy.  She was seen by neurosurgery who did not think that the spine imaging was concordant with the presentation as they noticed a left-sided L4-5 cyst.  She was referred to orthopedic surgery as well and was found to have a large Baker's cyst which was drained.  Had continued numbness in the bottom of her foot.  On physical examination she has positive straight leg raise, and then weakness noted mostly in the L5/S1 myotome.  She has tibial symptoms as noted but also has some peroneal innervated musculature with weakness as well as some sciatic weakness noted at the hamstrings level.  She has decreased medial hamstring reflex as well as decreased Achilles reflex.  She has decreased sensation mostly in an S1 distribution.  As we discussed at her previous appointment I feel that she likely is experiencing a double crush type phenomenon.  Her updated MRI and x-rays show no evidence of major instability and some areas of moderate compression.  She will continue to work with physical therapy, in regards to dry needling I prefer to avoid the area of the fibular neck as the peroneal nerve itself is at high risk at that location.  She is to starting physical therapy so I encouraged her to continue.  In regards to the right foot numbness is likely secondary to her S1 radiculopathy as well as her peripheral nerve issues.  Once she has had more time with her conservative management I like to see her again to discuss any further care or options for her.  I have refilled her neuropathic pain medication today.  Penne MICAEL Sharps MD/MSCR Neurosurgery - Peripheral Nerve Surgery

## 2024-02-09 DIAGNOSIS — M5459 Other low back pain: Secondary | ICD-10-CM | POA: Diagnosis not present

## 2024-02-13 ENCOUNTER — Telehealth: Payer: Self-pay

## 2024-02-13 ENCOUNTER — Other Ambulatory Visit (HOSPITAL_COMMUNITY): Payer: Self-pay

## 2024-02-13 ENCOUNTER — Telehealth

## 2024-02-13 ENCOUNTER — Other Ambulatory Visit: Payer: Self-pay | Admitting: Nurse Practitioner

## 2024-02-13 DIAGNOSIS — E782 Mixed hyperlipidemia: Secondary | ICD-10-CM

## 2024-02-13 DIAGNOSIS — Z6835 Body mass index (BMI) 35.0-35.9, adult: Secondary | ICD-10-CM

## 2024-02-13 DIAGNOSIS — I10 Essential (primary) hypertension: Secondary | ICD-10-CM

## 2024-02-13 NOTE — Telephone Encounter (Signed)
 Pts tricare plan was called and I was informed that ''[employer]IC[employer] FOR LIFE'' Has ended all  glp1s P/A'S since 8.31.25 if its not for DM2. Also the pts Primary doesn't cover GLP1s and is a plan benefit Exclusion   [employer]

## 2024-02-13 NOTE — Telephone Encounter (Signed)
 Copied from CRM [id number]. Topic: Clinical - Medication Prior Auth >> Feb 13, 2024 10:06 AM Charolett L wrote: Reason for CRM: Patient needs a prior auth for  ZEPBOUND  10 MG/0.5ML Pen sent to [facility] so that it can get approved

## 2024-02-16 DIAGNOSIS — M5459 Other low back pain: Secondary | ICD-10-CM | POA: Diagnosis not present

## 2024-02-20 ENCOUNTER — Ambulatory Visit (AMBULATORY_SURGERY_CENTER): Admitting: Gastroenterology

## 2024-02-20 ENCOUNTER — Encounter: Payer: Self-pay | Admitting: Gastroenterology

## 2024-02-20 ENCOUNTER — Telehealth: Payer: Self-pay

## 2024-02-20 ENCOUNTER — Other Ambulatory Visit (HOSPITAL_COMMUNITY): Payer: Self-pay

## 2024-02-20 VITALS — BP 104/46 | HR 68 | Temp 97.4°F | Resp 13 | Ht 65.0 in | Wt 210.0 lb

## 2024-02-20 DIAGNOSIS — Z860100 Personal history of colon polyps, unspecified: Secondary | ICD-10-CM

## 2024-02-20 DIAGNOSIS — K529 Noninfective gastroenteritis and colitis, unspecified: Secondary | ICD-10-CM | POA: Diagnosis not present

## 2024-02-20 DIAGNOSIS — Z1211 Encounter for screening for malignant neoplasm of colon: Secondary | ICD-10-CM | POA: Diagnosis not present

## 2024-02-20 DIAGNOSIS — K639 Disease of intestine, unspecified: Secondary | ICD-10-CM

## 2024-02-20 DIAGNOSIS — K6289 Other specified diseases of anus and rectum: Secondary | ICD-10-CM

## 2024-02-20 DIAGNOSIS — K5080 Crohn's disease of both small and large intestine without complications: Secondary | ICD-10-CM | POA: Diagnosis not present

## 2024-02-20 DIAGNOSIS — D120 Benign neoplasm of cecum: Secondary | ICD-10-CM

## 2024-02-20 DIAGNOSIS — K633 Ulcer of intestine: Secondary | ICD-10-CM

## 2024-02-20 DIAGNOSIS — K5730 Diverticulosis of large intestine without perforation or abscess without bleeding: Secondary | ICD-10-CM

## 2024-02-20 DIAGNOSIS — K635 Polyp of colon: Secondary | ICD-10-CM | POA: Diagnosis not present

## 2024-02-20 MED ORDER — SODIUM CHLORIDE 0.9 % IV SOLN: 500.0000 mL | Freq: Once | INTRAVENOUS | Status: DC

## 2024-02-20 NOTE — Patient Instructions (Signed)
 Handouts provided about diverticulosis and polyps.  Resume previous diet.  Continue present medications.  Await pathology results.  YOU HAD AN ENDOSCOPIC PROCEDURE TODAY AT THE [facility]:   Refer to the procedure report that was given to you for any specific questions about what was found during the examination.  If the procedure report does not answer your questions, please call your gastroenterologist to clarify.  If you requested that your care partner not be given the details of your procedure findings, then the procedure report has been included in a sealed envelope for you to review at your convenience later.  YOU SHOULD EXPECT: Some feelings of bloating in the abdomen. Passage of more gas than usual.  Walking can help get rid of the air that was put into your GI tract during the procedure and reduce the bloating. If you had a lower endoscopy (such as a colonoscopy or flexible sigmoidoscopy) you may notice spotting of blood in your stool or on the toilet paper. If you underwent a bowel prep for your procedure, you may not have a normal bowel movement for a few days.  Please Note:  You might notice some irritation and congestion in your nose or some drainage.  This is from the oxygen used during your procedure.  There is no need for concern and it should clear up in a day or so.  SYMPTOMS TO REPORT IMMEDIATELY:  Following lower endoscopy (colonoscopy or flexible sigmoidoscopy):  Excessive amounts of blood in the stool  Significant tenderness or worsening of abdominal pains  Swelling of the abdomen that is new, acute  Fever of 100F or higher  For urgent or emergent issues, a gastroenterologist can be reached at any hour by calling [phone]. Do not use MyChart messaging for urgent concerns.    DIET:  We do recommend a small meal at first, but then you may proceed to your regular diet.  Drink plenty of fluids but you should avoid alcoholic beverages for 24  hours.  ACTIVITY:  You should plan to take it easy for the rest of today and you should NOT DRIVE or use heavy machinery until tomorrow (because of the sedation medicines used during the test).    FOLLOW UP: Our staff will call the number listed on your records the next business day following your procedure.  We will call around 7:15- 8:00 am to check on you and address any questions or concerns that you may have regarding the information given to you following your procedure. If we do not reach you, we will leave a message.     If any biopsies were taken you will be contacted by phone or by letter within the next 1-3 weeks.  Please call us  at [phone] if you have not heard about the biopsies in 3 weeks.    SIGNATURES/CONFIDENTIALITY: You and/or your care partner have signed paperwork which will be entered into your electronic medical record.  These signatures attest to the fact that that the information above on your After Visit Summary has been reviewed and is understood.  Full responsibility of the confidentiality of this discharge information lies with you and/or your care-partner.

## 2024-02-20 NOTE — Telephone Encounter (Signed)
 Pt is request a denial letter via her [employer]. Please see below.         I have informed the pt of the following message above. However the pts requests a denial. So I have submitted to [employer] AS OF 11/7. The p/a closed out with this message from [facility] with no denial document to give to the patient. Pt will need to contact insurance

## 2024-02-20 NOTE — Progress Notes (Signed)
 To pacu, VSS. Report to [email or web address]

## 2024-02-20 NOTE — Op Note (Signed)
 [facility] Patient Name: Connie Porter Procedure Date: 02/20/2024 1:01 PM MRN: [id number] Endoscopist: Elspeth P. Leigh , MD, [id number] Age: 68 Referring MD:  Date of Birth: [birth year] Gender: Female Account #: [id number] Procedure:                Colonoscopy Indications:              High risk colon cancer surveillance: Personal                            history of colonic polyps - advanced polyp removed                            06/2022 - difficult location behind lipomatous IC                            valve, had recurrence at the site on exam 12/2022.                            Here for close surveillance follow up - on Humira                             with good control of Crohn's. Medicines:                Monitored Anesthesia Care Procedure:                Pre-Anesthesia Assessment:                           - Prior to the procedure, a History and Physical                            was performed, and patient medications and                            allergies were reviewed. The patient's tolerance of                            previous anesthesia was also reviewed. The risks                            and benefits of the procedure and the sedation                            options and risks were discussed with the patient.                            All questions were answered, and informed consent                            was obtained. Prior Anticoagulants: The patient has                            taken no anticoagulant or antiplatelet agents. ASA  Grade Assessment: II - A patient with mild systemic                            disease. After reviewing the risks and benefits,                            the patient was deemed in satisfactory condition to                            undergo the procedure.                           After obtaining informed consent, the colonoscope                            was passed under direct  vision. Throughout the                            procedure, the patient's blood pressure, pulse, and                            oxygen saturations were monitored continuously. The                            Colonoscope was introduced through the anus and                            advanced to the the cecum, identified by                            appendiceal orifice and ileocecal valve. The                            colonoscopy was performed without difficulty. The                            patient tolerated the procedure well. The quality                            of the bowel preparation was adequate. The                            ileocecal valve, appendiceal orifice, and rectum                            were photographed. Scope In: 1:22:20 PM Scope Out: 1:50:36 PM Scope Withdrawal Time: 0 hours 23 minutes 13 seconds  Total Procedure Duration: 0 hours 28 minutes 16 seconds  Findings:                 The perianal and digital rectal examinations were                            normal.  A small area of flat polyp versus transition mucosa                            into the IC valve was found in the cecum behind the                            valve, very difficult to visualize. There was                            residual seeds / stool in the area that required                            extensive lavage.. The area was removed with a                            combination of cold snare and cold forceps.                           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 It appeared much more                            prominent than the last exam. The overlying surface                            appeared inflamed, biopsies were taken with a cold                            forceps for histology, revealed lipomatous tissue                            underneath the mucosa.                            Patchy mild inflammation characterized by erosions                            and erythema was found in the entire colon.                            Biopsies were taken with a cold forceps for                            histology.                           Multiple diverticula were found in the left colon.                           Anal papilla(e) were hypertrophied.  The exam was otherwise normal throughout the                            examined colon. Complications:            No immediate complications. Estimated blood loss:                            Minimal. Estimated Blood Loss:     Estimated blood loss was minimal. Impression:               - One small polypoid area vs. normal variant in the                            cecum, removed with a cold snare / cold forceps -                            extremely difficult area to visualize and get                            access to behind the valve.                           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 Biopsied.                           - Patchy mild inflammation was found in the entire                            examined colon. Biopsied.                           - Diverticulosis in the left colon.                           - Anal papilla(e) were hypertrophied. Recommendation:           - Patient has a contact number available for                            emergencies. The signs and symptoms of potential                            delayed complications were discussed with the                            patient. Return to normal activities tomorrow.                            Written discharge instructions were provided to the                            patient.                           -  Resume previous diet.                           - Continue present medications.                           - Await pathology results. Elspeth P. Sakshi Sermons,  MD 02/20/2024 2:02:40 PM This report has been signed electronically.

## 2024-02-20 NOTE — Progress Notes (Signed)
 [facility] History and Physical   Primary Care Physician:  Oris Camie BRAVO, NP   Reason for Procedure:   History of polyps, Crohn's disease  Plan:    colonoscopy     HPI: Connie Porter is a 68 y.o. female  here for colonoscopy surveillance - large polyp removed from the cecum last year, surveillance exam 6 months later showed residual polyp in the cecum that was removed. Here for close surveillance follow up.   . Patient denies any bowel symptoms at this time. Otherwise feels well without any cardiopulmonary symptoms.   I have discussed risks / benefits of anesthesia and endoscopic procedure with Connie Porter and they wish to proceed with the exams as outlined today.   The patient was provided an opportunity to ask questions and all were answered. The patient agreed with the plan.    Past Medical History:  Diagnosis Date   Chronic left shoulder pain 06/07/2022   Crohn's disease (HCC)    Dental crowns present    Encounter for annual physical exam 11/13/2022   GERD (gastroesophageal reflux disease)    High cholesterol    History of hiatal hernia    HTN (hypertension) 04/27/2018   Hyperlipidemia 01/13/2014   Osteoarthritis of knee, unilateral 09/2014   right    Patellofemoral arthritis of right knee 10/07/2014   Seasonal allergies     Past Surgical History:  Procedure Laterality Date   APPENDECTOMY     CESAREAN SECTION     x 3   CHOLECYSTECTOMY     COLONOSCOPY     ESOPHAGOGASTRODUODENOSCOPY (EGD) WITH ESOPHAGEAL DILATION     x 2   JOINT REPLACEMENT  07/11/2022   Shoulder replacement   PATELLA-FEMORAL ARTHROPLASTY Right 10/07/2014   Procedure: RIGHT PATELLA-FEMORAL ARTHROPLASTY;  Surgeon: Fonda Olmsted, MD;  Location: [facility];  Service: Orthopedics;  Laterality: Right;   SHOULDER SURGERY Left    TONSILLECTOMY AND ADENOIDECTOMY     TUBAL LIGATION  O8/13/1991   WISDOM TOOTH EXTRACTION      Prior to Admission medications   Medication  Sig Start Date End Date Taking? Authorizing Provider  CALCIUM  CITRATE PO Take 1 tablet by mouth 2 (two) times daily.   Yes [provider]  Cholecalciferol (VITAMIN D -3 PO) Take 1 capsule by mouth at bedtime.   Yes [provider]  Cyanocobalamin (VITAMIN B-12 PO) Take 1 tablet by mouth daily.   Yes [provider]  DYMISTA  137-50 MCG/ACT SUSP Spray 1 spray twice a day by intranasal route. 01/02/24  Yes [provider]  ezetimibe  (ZETIA ) 10 MG tablet Take 1 tablet (10 mg total) by mouth daily. 03/18/23 03/12/24 Yes Nahser, Aleene PARAS, MD  fexofenadine (ALLEGRA) 180 MG tablet Take 180 mg by mouth daily.   Yes [provider]  gabapentin (NEURONTIN) 300 MG capsule Take 1 capsule (300 mg total) by mouth 3 (three) times daily. 02/06/24  Yes Claudene Penne ORN, MD  levocetirizine (XYZAL ) 5 MG tablet Take 5 mg by mouth every evening.   Yes [provider]  montelukast  (SINGULAIR ) 10 MG tablet Take 10 mg by mouth at bedtime.   Yes [provider]  Omega-3 Fatty Acids (FISH OIL PO) Take 2 capsules by mouth 2 (two) times daily.    Yes [provider]  rosuvastatin  (CRESTOR ) 20 MG tablet TAKE 1 TABLET(20 MG) BY MOUTH DAILY 04/11/23  Yes Nahser, Aleene PARAS, MD  valsartan  (DIOVAN ) 80 MG tablet Take 1 tablet (80 mg total) by mouth daily.  09/10/23  Yes Nahser, Aleene PARAS, MD  adalimumab  (HUMIRA , 2 PEN,) 80 MG/0.8ML pen Inject 0.8 mLs (80 mg total) into the skin every 14 (fourteen) days. 02/05/24   Pegge Cumberledge, Elspeth SQUIBB, MD  cyclobenzaprine (FLEXERIL) 5 MG tablet Take 5 mg by mouth at bedtime as needed. Patient not taking: Reported on 02/20/2024 02/20/23   [provider]  dicyclomine  (BENTYL ) 10 MG capsule Take 1 capsule (10 mg total) by mouth every 8 (eight) hours as needed for spasms. Patient not taking: Reported on 02/20/2024 05/20/23   Leigh Elspeth SQUIBB, MD  Ferrous Sulfate 5 MG/20ML SOLN Take by mouth daily.    [provider]   Iron-Folic Acid-Vit B12 (IRON FORMULA PO) Take by mouth.    [provider]  Multiple Vitamin (MULITIVITAMIN WITH MINERALS) TABS Take 1 tablet by mouth 2 (two) times daily.    [provider]  Olopatadine-Mometasone (RYALTRIS) 665-25 MCG/ACT SUSP 2 puffs in each nostril Nasally Twice a day; Duration: 30 days    [provider]  ZEPBOUND  10 MG/0.5ML Pen INJECT 10 MG UNDER THE SKIN ONCE A WEEK. START AFTER COMPLETING 4 WEEKS ON 7.5 MG 02/13/24   Early, Camie BRAVO, NP    Current Outpatient Medications  Medication Sig Dispense Refill   CALCIUM  CITRATE PO Take 1 tablet by mouth 2 (two) times daily.     Cholecalciferol (VITAMIN D -3 PO) Take 1 capsule by mouth at bedtime.     Cyanocobalamin (VITAMIN B-12 PO) Take 1 tablet by mouth daily.     DYMISTA  137-50 MCG/ACT SUSP Spray 1 spray twice a day by intranasal route.     ezetimibe  (ZETIA ) 10 MG tablet Take 1 tablet (10 mg total) by mouth daily. 90 tablet 3   fexofenadine (ALLEGRA) 180 MG tablet Take 180 mg by mouth daily.     gabapentin (NEURONTIN) 300 MG capsule Take 1 capsule (300 mg total) by mouth 3 (three) times daily. 90 capsule 2   levocetirizine (XYZAL ) 5 MG tablet Take 5 mg by mouth every evening.     montelukast  (SINGULAIR ) 10 MG tablet Take 10 mg by mouth at bedtime.     Omega-3 Fatty Acids (FISH OIL PO) Take 2 capsules by mouth 2 (two) times daily.      rosuvastatin  (CRESTOR ) 20 MG tablet TAKE 1 TABLET(20 MG) BY MOUTH DAILY 90 tablet 3   valsartan  (DIOVAN ) 80 MG tablet Take 1 tablet (80 mg total) by mouth daily. 90 tablet 3   adalimumab  (HUMIRA , 2 PEN,) 80 MG/0.8ML pen Inject 0.8 mLs (80 mg total) into the skin every 14 (fourteen) days. 2 each 6   cyclobenzaprine (FLEXERIL) 5 MG tablet Take 5 mg by mouth at bedtime as needed. (Patient not taking: Reported on 02/20/2024)     dicyclomine  (BENTYL ) 10 MG capsule Take 1 capsule (10 mg total) by mouth every 8 (eight) hours as needed for spasms. (Patient not taking: Reported  on 02/20/2024) 30 capsule 1   Ferrous Sulfate 5 MG/20ML SOLN Take by mouth daily.     Iron-Folic Acid-Vit B12 (IRON FORMULA PO) Take by mouth.     Multiple Vitamin (MULITIVITAMIN WITH MINERALS) TABS Take 1 tablet by mouth 2 (two) times daily.     Olopatadine-Mometasone (RYALTRIS) 665-25 MCG/ACT SUSP 2 puffs in each nostril Nasally Twice a day; Duration: 30 days     ZEPBOUND  10 MG/0.5ML Pen INJECT 10 MG UNDER THE SKIN ONCE A WEEK. START AFTER COMPLETING 4 WEEKS ON 7.5 MG 6 mL 3   Current Facility-Administered  Medications  Medication Dose Route Frequency Provider Last Rate Last Admin   0.9 %  sodium chloride  infusion  500 mL Intravenous Once Estell Dillinger, Elspeth SQUIBB, MD        Allergies as of 02/20/2024   (No Known Allergies)    Family History  Problem Relation Age of Onset   Arthritis Mother    Dementia Mother    Heart disease Father 38   Hyperlipidemia Father    Hypertension Father    Heart attack Father    Heart disease Brother 86       MI   Parkinson's disease Brother    Stroke Maternal Grandfather    Asthma Son    Colon cancer Neg Hx    Esophageal cancer Neg Hx    Pancreatic cancer Neg Hx    Stomach cancer Neg Hx    Liver disease Neg Hx    Rectal cancer Neg Hx     Social History   Socioeconomic History   Marital status: Married    Spouse name: Not on file   Number of children: 3   Years of education: Not on file   Highest education level: Bachelor's degree (e.g., BA, AB, BS)  Occupational History   Occupation: Retired Runner, Broadcasting/film/video  Tobacco Use   Smoking status: Never   Smokeless tobacco: Never  Vaping Use   Vaping status: Never Used  Substance and Sexual Activity   Alcohol use: Yes    Alcohol/week: 4.0 standard drinks of alcohol    Types: 4 Glasses of wine per week    Comment: weekends   Drug use: No   Sexual activity: Yes    Birth control/protection: Post-menopausal  Other Topics Concern   Not on file  Social History Narrative   Not on file   Social Drivers  of Health   Financial Resource Strain: Low Risk  (10/06/2023)   Overall Financial Resource Strain (CARDIA)    Difficulty of Paying Living Expenses: Not hard at all  Food Insecurity: No Food Insecurity (10/06/2023)   Hunger Vital Sign    Worried About Running Out of Food in the Last Year: Never true    Ran Out of Food in the Last Year: Never true  Transportation Needs: No Transportation Needs (10/06/2023)   PRAPARE - Administrator, Civil Service (Medical): No    Lack of Transportation (Non-Medical): No  Physical Activity: Sufficiently Active (10/06/2023)   Exercise Vital Sign    Days of Exercise per Week: 4 days    Minutes of Exercise per Session: 60 min  Stress: No Stress Concern Present (10/06/2023)   [employer] of Occupational Health - Occupational Stress Questionnaire    Feeling of Stress: Not at all  Social Connections: Moderately Isolated (10/06/2023)   Social Connection and Isolation Panel    Frequency of Communication with Friends and Family: More than three times a week    Frequency of Social Gatherings with Friends and Family: Once a week    Attends Religious Services: Never    Database Administrator or Organizations: No    Attends Engineer, Structural: Not on file    Marital Status: Married  Catering Manager Violence: Not At Risk (10/08/2022)   Humiliation, Afraid, Rape, and Kick questionnaire    Fear of Current or Ex-Partner: No    Emotionally Abused: No    Physically Abused: No    Sexually Abused: No    Review of Systems: All other review of systems negative except as mentioned  in the HPI.  Physical Exam: Vital signs BP 117/85   Pulse 85   Temp (!) 97.4 F (36.3 C) (Temporal)   Ht 5' 5 (1.651 m)   Wt 210 lb (95.3 kg)   SpO2 95%   BMI 34.95 kg/m   General:   Alert,  Well-developed, pleasant and cooperative in NAD Lungs:  Clear throughout to auscultation.   Heart:  Regular rate and rhythm Abdomen:  Soft, nontender and nondistended.    Neuro/Psych:  Alert and cooperative. Normal mood and affect. A and O x 3  Marcey Naval, MD [facility]

## 2024-02-23 ENCOUNTER — Telehealth: Payer: Self-pay | Admitting: *Deleted

## 2024-02-23 DIAGNOSIS — M5459 Other low back pain: Secondary | ICD-10-CM | POA: Diagnosis not present

## 2024-02-23 NOTE — Telephone Encounter (Signed)
  Follow up Call-     02/20/2024   12:37 PM 12/30/2022    7:37 AM 06/28/2022    2:28 PM  Call back number  Post procedure Call Back phone  # [phone]  Permission to leave phone message Yes Yes Yes     Patient questions:  Do you have a fever, pain , or abdominal swelling? No. Pain Score  0 *  Have you tolerated food without any problems? Yes.    Have you been able to return to your normal activities? Yes.    Do you have any questions about your discharge instructions: Diet   No. Medications  No. Follow up visit  No.  Do you have questions or concerns about your Care? No.  Actions: * If pain score is 4 or above: No action needed, pain <4.

## 2024-02-25 LAB — SURGICAL PATHOLOGY

## 2024-02-28 ENCOUNTER — Ambulatory Visit: Payer: Self-pay | Admitting: Gastroenterology

## 2024-02-28 DIAGNOSIS — K5080 Crohn's disease of both small and large intestine without complications: Secondary | ICD-10-CM

## 2024-03-01 DIAGNOSIS — M5459 Other low back pain: Secondary | ICD-10-CM | POA: Diagnosis not present

## 2024-03-08 ENCOUNTER — Other Ambulatory Visit: Payer: Self-pay | Admitting: Emergency Medicine

## 2024-03-08 DIAGNOSIS — E782 Mixed hyperlipidemia: Secondary | ICD-10-CM

## 2024-03-08 DIAGNOSIS — M5459 Other low back pain: Secondary | ICD-10-CM | POA: Diagnosis not present

## 2024-03-08 DIAGNOSIS — I2510 Atherosclerotic heart disease of native coronary artery without angina pectoris: Secondary | ICD-10-CM

## 2024-03-10 MED ORDER — EZETIMIBE 10 MG PO TABS: 10.0000 mg | ORAL_TABLET | Freq: Every day | ORAL | 0 refills | Status: AC

## 2024-03-26 ENCOUNTER — Other Ambulatory Visit

## 2024-03-26 DIAGNOSIS — K5080 Crohn's disease of both small and large intestine without complications: Secondary | ICD-10-CM

## 2024-03-29 ENCOUNTER — Encounter: Payer: Self-pay | Admitting: Family Medicine

## 2024-04-01 ENCOUNTER — Other Ambulatory Visit: Payer: Self-pay

## 2024-04-01 DIAGNOSIS — I2510 Atherosclerotic heart disease of native coronary artery without angina pectoris: Secondary | ICD-10-CM

## 2024-04-01 DIAGNOSIS — E782 Mixed hyperlipidemia: Secondary | ICD-10-CM

## 2024-04-01 LAB — CALPROTECTIN: Calprotectin: 537 ug/g — ABNORMAL HIGH

## 2024-04-02 ENCOUNTER — Ambulatory Visit: Payer: Self-pay | Admitting: Gastroenterology

## 2024-04-02 ENCOUNTER — Ambulatory Visit (INDEPENDENT_AMBULATORY_CARE_PROVIDER_SITE_OTHER): Admitting: Neurosurgery

## 2024-04-02 ENCOUNTER — Encounter: Payer: Self-pay | Admitting: Neurosurgery

## 2024-04-02 VITALS — BP 122/84 | Ht 67.0 in | Wt 206.0 lb

## 2024-04-02 DIAGNOSIS — M5417 Radiculopathy, lumbosacral region: Secondary | ICD-10-CM | POA: Diagnosis not present

## 2024-04-02 DIAGNOSIS — M792 Neuralgia and neuritis, unspecified: Secondary | ICD-10-CM

## 2024-04-02 DIAGNOSIS — G8929 Other chronic pain: Secondary | ICD-10-CM

## 2024-04-02 DIAGNOSIS — M4316 Spondylolisthesis, lumbar region: Secondary | ICD-10-CM | POA: Diagnosis not present

## 2024-04-02 LAB — ADALIMUMAB+AB (SERIAL MONITOR)
Adalimumab Drug Level: 24 ug/mL
Anti-Adalimumab Antibody: <25 ng/mL

## 2024-04-02 LAB — SERIAL MONITORING

## 2024-04-02 MED ORDER — ROSUVASTATIN CALCIUM 20 MG PO TABS: ORAL_TABLET | ORAL | 0 refills | Status: AC

## 2024-04-02 NOTE — Progress Notes (Signed)
 "   Referring Physician:  Oris Camie BRAVO, NP [address] [place],  [place] [address]  Primary Physician:  Oris Camie BRAVO, NP  Discussed the use of AI scribe software for clinical note transcription with the patient, who gave verbal consent to proceed.  History of Present Illness Connie Porter is a 68 year old female with chronic low back pain and right S1 radiculopathy who presents for evaluation of worsening low back pain and persistent neuropathic symptoms in the right lower extremity.  Low back pain has progressively worsened over the past year and is now severe and debilitating, rated ten plus on rising in the morning. Pain is localized to the lower lumbar region, sometimes midline, and alternates between the right and left paraspinal areas without clear lateralization. Morning pain is worst, persists through the day, and is associated with severe paraspinal muscle spasms and inability to perform normal daily activities or exercise.  For about 18 months she has had right lower extremity neuropathic symptoms with numbness in the foot and shooting pain down the posterior calf into the lateral calf and foot, with calf tenderness and a chronic buzzing sensation. She notes marked right leg weakness compared to the left, as documented by physical therapy and chiropractic providers.  She has tried physical therapy and chiropractic care without relief, and PT worsened her pain. Two epidural steroid injections about one year ago did not help. She has not had facet injections or medial branch blocks. Prior lumbar MRI showed a small L3-4 disc herniation, mild L4-5 facet arthropathy, and low-grade spondylolisthesis without S1 nerve root compression. EMG confirmed chronic right S1 radiculopathy. She has also had additional x-rays and a leg MRI.    Review of Systems:  A 10 point review of systems is negative, except for the pertinent positives and negatives detailed in the HPI.  Past Medical  History: Past Medical History:  Diagnosis Date   Chronic left shoulder pain 06/07/2022   Crohn's disease (HCC)    Dental crowns present    Encounter for annual physical exam 11/13/2022   GERD (gastroesophageal reflux disease)    High cholesterol    History of hiatal hernia    HTN (hypertension) 04/27/2018   Hyperlipidemia 01/13/2014   Osteoarthritis of knee, unilateral 09/2014   right    Patellofemoral arthritis of right knee 10/07/2014   Seasonal allergies     Past Surgical History: Past Surgical History:  Procedure Laterality Date   APPENDECTOMY     CESAREAN SECTION     x 3   CHOLECYSTECTOMY     COLONOSCOPY     ESOPHAGOGASTRODUODENOSCOPY (EGD) WITH ESOPHAGEAL DILATION     x 2   JOINT REPLACEMENT  07/11/2022   Shoulder replacement   PATELLA-FEMORAL ARTHROPLASTY Right 10/07/2014   Procedure: RIGHT PATELLA-FEMORAL ARTHROPLASTY;  Surgeon: Fonda Olmsted, MD;  Location: [facility];  Service: Orthopedics;  Laterality: Right;   SHOULDER SURGERY Left    TONSILLECTOMY AND ADENOIDECTOMY     TUBAL LIGATION  O8/13/1991   WISDOM TOOTH EXTRACTION      Allergies: Allergies as of 04/02/2024   (No Known Allergies)    Medications:  Current Outpatient Medications:    adalimumab  (HUMIRA , 2 PEN,) 80 MG/0.8ML pen, Inject 0.8 mLs (80 mg total) into the skin every 14 (fourteen) days., Disp: 2 each, Rfl: 6   CALCIUM  CITRATE PO, Take 1 tablet by mouth 2 (two) times daily., Disp: , Rfl:    Cholecalciferol (VITAMIN D -3 PO), Take 1 capsule by mouth at  bedtime., Disp: , Rfl:    Cyanocobalamin (VITAMIN B-12 PO), Take 1 tablet by mouth daily., Disp: , Rfl:    DYMISTA  137-50 MCG/ACT SUSP, Spray 1 spray twice a day by intranasal route., Disp: , Rfl:    ezetimibe  (ZETIA ) 10 MG tablet, Take 1 tablet (10 mg total) by mouth daily., Disp: 90 tablet, Rfl: 0   Ferrous Sulfate 5 MG/20ML SOLN, Take by mouth daily., Disp: , Rfl:    fexofenadine (ALLEGRA) 180 MG tablet, Take 180 mg by mouth  daily., Disp: , Rfl:    gabapentin  (NEURONTIN ) 300 MG capsule, Take 1 capsule (300 mg total) by mouth 3 (three) times daily., Disp: 90 capsule, Rfl: 2   Iron-Folic Acid-Vit B12 (IRON FORMULA PO), Take by mouth., Disp: , Rfl:    levocetirizine (XYZAL ) 5 MG tablet, Take 5 mg by mouth every evening., Disp: , Rfl:    montelukast  (SINGULAIR ) 10 MG tablet, Take 10 mg by mouth at bedtime., Disp: , Rfl:    Multiple Vitamin (MULITIVITAMIN WITH MINERALS) TABS, Take 1 tablet by mouth 2 (two) times daily., Disp: , Rfl:    Olopatadine-Mometasone (RYALTRIS) 665-25 MCG/ACT SUSP, 2 puffs in each nostril Nasally Twice a day; Duration: 30 days, Disp: , Rfl:    Omega-3 Fatty Acids (FISH OIL PO), Take 2 capsules by mouth 2 (two) times daily. , Disp: , Rfl:    valsartan  (DIOVAN ) 80 MG tablet, Take 1 tablet (80 mg total) by mouth daily., Disp: 90 tablet, Rfl: 3   ZEPBOUND  10 MG/0.5ML Pen, INJECT 10 MG UNDER THE SKIN ONCE A WEEK. START AFTER COMPLETING 4 WEEKS ON 7.5 MG, Disp: 6 mL, Rfl: 3   rosuvastatin  (CRESTOR ) 20 MG tablet, TAKE 1 TABLET(20 MG) BY MOUTH DAILY, Disp: 90 tablet, Rfl: 0  Social History: Social History   Tobacco Use   Smoking status: Never   Smokeless tobacco: Never  Vaping Use   Vaping status: Never Used  Substance Use Topics   Alcohol use: Yes    Alcohol/week: 4.0 standard drinks of alcohol    Types: 4 Glasses of wine per week    Comment: weekends   Drug use: No    Family Medical History: Family History  Problem Relation Age of Onset   Arthritis Mother    Dementia Mother    Heart disease Father 80   Hyperlipidemia Father    Hypertension Father    Heart attack Father    Heart disease Brother 18       MI   Parkinson's disease Brother    Stroke Maternal Grandfather    Asthma Son    Colon cancer Neg Hx    Esophageal cancer Neg Hx    Pancreatic cancer Neg Hx    Stomach cancer Neg Hx    Liver disease Neg Hx    Rectal cancer Neg Hx     Physical Examination: Vitals:   04/02/24  0950  BP: 122/84   NEUROLOGICAL:     Awake, alert, oriented to person, place, and time.  Speech is clear and fluent.   Cranial Nerves: Pupils equal round and reactive to light.  Facial tone is symmetric. Shoulder shrug is symmetric. Tongue protrusion is midline.  There is no pronator drift.  Motor Exam:  LUMBAR PLEXUS NERVE AND MUSCLE EXAMINATION        DATE 04/02/24       Side Inovlved Right lower extremity       Examiner BWS     NERVE ROOTS MUSCLE  Femoral  L1,L2,L3 Illiopsoas 5      L2,L3,L4 Quadriceps 5     Obturator L2,L3,L4 Thigh Adductors 5     Superior Gluteal L4,L5,S1 Gluteus Minimus Medius     4     Inferior Gluteal L5,S1,S2 Gluteus Maximus 4+     Sciatic L5,S1,S2 Hamstrings 4     Tibial S1,S2 Gastrocnemius Soleus         4      L4,L5 Tibialis Posterior  4+      L5,S1,S2 Flexor Digitorum Longus+   4      S1,S2 Foot Intrinsics 4     Deep Peroneal L4,L5 Tibialis Anterior 4+      L5,S1  Extensor Digitorum Longus  4      L5,S2 Extensor Hallucis Longus   4     Superficial Peroneal L5,S1 Peroneus Longus Brevis     4        Reflexes are decreased at the right medial hamstring as well as the right Achilles.  Intact at the right patella and left lower extremity.  Decreased sensation in the right S1 distribution.  Mild hip drop    Medical Decision Making  Imaging: Narrative & Impression  CLINICAL DATA:  Evaluate for excess motion., AP, lateral, flexion extension   EXAM: LUMBAR SPINE - COMPLETE 4+ VIEW   COMPARISON:  MRI outside hospital 04/08/2023   FINDINGS: There is no evidence of lumbar spine fracture. Grade 1 anterolisthesis of L4 on L5. No change in positioning on flexion extension views. Multilevel mild moderate facet arthropathy. No severe osseous neural foraminal or central canal stenosis. L5-S1 intervertebral disc space narrowing.   Atherosclerotic plaque. Right upper quadrant surgical clips. Bilateral pelvic staples consistent with tubal  ligation.   Atherosclerotic plaque.   IMPRESSION: 1. No acute displaced fracture or traumatic listhesis of the lumbar spine. 2. Grade 1 anterolisthesis of L4 on L5. No change in positioning on flexion extension views. 3.  Aortic Atherosclerosis (ICD10-I70.0).     Electronically Signed   By: Morgane  Naveau M.D.   On: 01/22/2024 22:00     Narrative & Impression  EXAM: MRI LUMBAR SPINE 01/28/2024 09:44:11 AM   TECHNIQUE: Multiplanar multisequence MRI of the lumbar spine was performed without the administration of intravenous contrast.   COMPARISON: Prior MRI from 04/08/2023.   CLINICAL HISTORY: Worsening right lower extremity radiculopathy, previously known spondylosis and facet arthropathy. Chronic bilateral low back pain with right-sided sciatica. No surgery. No injury. No cancer.   FINDINGS:   BONES AND ALIGNMENT: Standard segmentation. Lowest well formed disc space labeled the L5-S1 level. 2 mm facet mediastinal anterolisthesis of L4 on L5. Trace degenerative retrolisthesis of T12 on L1. Chronic compression deformity involving the T10 vertebral body without retropulsion. Vertebral body height otherwise maintained. Bone marrow signal intensity within normal limits. Mild degenerative reactive endplate change with marrow edema present about the L3-L4 interspace. Minimal reactive endplate changes noted at T12-L1.   SPINAL CORD: The conus terminates at the L1 level.   SOFT TISSUES: No paraspinal mass.   T9-T10: Mild noncompressive disc bulging and endplate spurring. No significant stenosis.   T10-T11: Mild noncompressive disc bulging and endplate spurring. No significant stenosis.   T11-T12: Mild noncompressive disc bulging and endplate spurring. No significant stenosis.   T12-L1: Mild noncompressive disc bulging and endplate spurring. No significant stenosis.   L1-L2: Disc desiccation with minimal disc bulge. Mild facet spurring. No stenosis.    L2-L3: Mild disc bulge with disc desiccation. Moderate left greater than right  facet hypertrophy. No significant spinal stenosis. Foramina are patent.   L3-L4: Degenerative vertebral disc space narrowing with diffuse disc bulge and disc desiccation. Reactive endplate spurring. Superimposed right foraminal to extraforaminal disc protrusion closely approximates the exiting right L3 nerve root (series 306, image 23). Moderate right worse than left facet hypertrophy. No significant spinal stenosis. Moderate right with mild left foraminal narrowing.   L4-L5: Disc desiccation with mild disc bulge. Advanced bilateral facet arthrosis with small joint effusions. No significant spinal stenosis. Mild to moderate left with mild right L4 foraminal narrowing.   L5-S1: Disc desiccation with mild disc bulge. Reactive endplate spurring on the left. Moderate right worse than left facet hypertrophy. No spinal stenosis. Moderate left L5 foraminal narrowing.   IMPRESSION: 1. L3-4: Right foraminal to extraforaminal disc protrusion closely approximating and potentially affecting the exiting right L3 nerve root. Moderate right foraminal narrowing at this level. 2. Additional relatively mild for age multilevel degenerative disc disease as above. No significant stenosis or vertebral impingement. 3. Moderate to advanced lower lumbar facet hypertrophy most pronounced at L4-L5. 4. Chronic T10 compression fracture.   Electronically signed by: Morene Hoard MD 01/28/2024 08:52 PM EDT RP Workstation: [id number]    Electrodiagnostics:  [facility]  [address] [place], [address]  [place], [place] [address] Tel: [phone] Fax: [phone] Test Date:  03/28/2023   Patient: Connie Porter DOB: 07/26/1955 Physician: Tonita Blanch, DO  Sex: Female Height: 5' 7 Ref Phys: Fonda Olmsted, MD  ID#: [id number]     Technician:      History: This is a 68 year old female referred for evaluation  of right foot numbness.   NCV & EMG Findings: Extensive electrodiagnostic testing of the right lower extremity shows:  Right sural and superficial peroneal sensory responses are within normal limits. Right peroneal motor responses within normal limits.  Right tibial motor response is absent. Right tibial H reflex study shows prolonged latency. Chronic motor axonal loss changes are seen affecting the S1 myotome, without accompanying active denervation.   Impression: Chronic S1 radiculopathy affecting the right lower extremity, moderate. There is no evidence of a large fiber sensorimotor polyneuropathy.     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 Pain is functionally limiting and refractory to physical therapy, chiropractic care, and prior epidural steroid injections. Imaging does not show significant instability, though microinstability cannot be excluded. Ineffectiveness of epidural steroid injections supports a facet-mediated pain source. Spinal fusion is not currently preferred next step due to lack of significant instability; less invasive joint-level interventions are preferred to avoid major surgery. - Recommended facet injections or medial branch blocks to directly target joint-level pain, as these are less invasive than spinal fusion. - Discussed that epidural steroid injections are more effective for radicular pain and were ineffective for her back pain, supporting the decision to pursue facet-based interventions. - Agreed to contact her primary care provider to determine the preferred referral pathway for back injections and to proceed with  direct referral once guidance is received.  Chronic S1 radiculopathy with neuropathic leg and foot pain She has chronic S1 radiculopathy with persistent neuropathic pain and numbness in the leg and foot, confirmed by EMG. Symptoms are longstanding and likely represent chronic nerve injury rather than  acute inflammation. Recent imaging shows no ongoing nerve compression. Most nerve recovery occurs within two years; further improvement is unlikely. Epidural steroid injections were ineffective and further injections are not expected to provide benefit. - Discussed that additional epidural steroid injections are unlikely to be beneficial for chronic S1 radiculopathy, as these are more effective for acute inflammation than chronic nerve injury. - Discussed the potential for spinal cord stimulator placement in the future if pain remains refractory to other interventions, as this could address both back and leg pain while avoiding major surgery.  Penne MICAEL Sharps MD/MSCR Neurosurgery - Peripheral Nerve Surgery   "

## 2024-04-05 DIAGNOSIS — K219 Gastro-esophageal reflux disease without esophagitis: Secondary | ICD-10-CM | POA: Diagnosis not present

## 2024-04-05 DIAGNOSIS — J301 Allergic rhinitis due to pollen: Secondary | ICD-10-CM | POA: Diagnosis not present

## 2024-04-16 ENCOUNTER — Ambulatory Visit (INDEPENDENT_AMBULATORY_CARE_PROVIDER_SITE_OTHER): Payer: Self-pay | Admitting: Nurse Practitioner

## 2024-04-16 ENCOUNTER — Encounter: Payer: Self-pay | Admitting: Nurse Practitioner

## 2024-04-16 VITALS — BP 138/78 | HR 76 | Ht 67.0 in | Wt 216.0 lb

## 2024-04-16 DIAGNOSIS — G8929 Other chronic pain: Secondary | ICD-10-CM

## 2024-04-16 DIAGNOSIS — M792 Neuralgia and neuritis, unspecified: Secondary | ICD-10-CM

## 2024-04-16 DIAGNOSIS — E66812 Obesity, class 2: Secondary | ICD-10-CM | POA: Diagnosis not present

## 2024-04-16 DIAGNOSIS — K50819 Crohn's disease of both small and large intestine with unspecified complications: Secondary | ICD-10-CM | POA: Diagnosis not present

## 2024-04-16 DIAGNOSIS — I10 Essential (primary) hypertension: Secondary | ICD-10-CM | POA: Diagnosis not present

## 2024-04-16 DIAGNOSIS — Z6835 Body mass index (BMI) 35.0-35.9, adult: Secondary | ICD-10-CM

## 2024-04-16 MED ORDER — GABAPENTIN 300 MG PO CAPS: 300.0000 mg | ORAL_CAPSULE | Freq: Three times a day (TID) | ORAL | 2 refills | Status: AC

## 2024-04-16 NOTE — Assessment & Plan Note (Signed)
 Recent colonoscopy indicated possible loss of remission. Awaiting further evaluation and potential medication adjustment. - Follow up with gastroenterologist at the end of January for further evaluation and potential medication adjustment.

## 2024-04-16 NOTE — Assessment & Plan Note (Signed)
 Blood pressure slightly elevated at 138/78. Currently on valsartan . No symptoms of shortness of breath, chest pain, or dizziness reported. - Monitor blood pressure at home a couple of days a week. - Follow up with cardiologist for blood pressure management.

## 2024-04-16 NOTE — Assessment & Plan Note (Signed)
 Continue with gabapentin  for management. Refills provided today.  Orders:   gabapentin  (NEURONTIN ) 300 MG capsule; Take 1 capsule (300 mg total) by mouth 3 (three) times daily.

## 2024-04-16 NOTE — Assessment & Plan Note (Signed)
 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 Previous epidural injections were ineffective. Considering spinal infusion surgery or lumbar facet injections. Discussed potential benefits and recovery process of surgery with a friend who underwent the procedure. Considering injections first due to potential effectiveness and recovery concerns. - Contact referral coordinator to find a local provider for lumbar facet injections. - Consider spinal infusion surgery if injections are ineffective.

## 2024-04-16 NOTE — Assessment & Plan Note (Signed)
 BMI is 33.82 with a recent weight gain of 10 pounds over the holiday period. Previously on Zepbound , which was effective in curbing appetite and reducing alcohol consumption, but insurance no longer covers it. 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 - Continue current exercise regimen, including sit-down elliptical and weightlifting.

## 2024-04-16 NOTE — Progress Notes (Signed)
 "  Connie Doing, DNP, AGNP-c [facility]  [address] [place], [place] [address] [phone]   ESTABLISHED PATIENT- Chronic Health and/or Follow-Up Visit on 04/16/2024  Blood pressure 138/78, pulse 76, height 5' 7 (1.702 m), weight 216 lb (98 kg), SpO2 97%.   Subjective:  other (6 month Med check-weight)  History of Present Illness Connie Porter is a 69 year old female who presents for follow-up on her weight and medication management.  She has experienced a slight increase in weight since her last check, which she attributes to being unable to obtain Zepbound  due to insurance changes. She has been off Zepbound  for about one to two months, which she found effective in curbing her appetite and reducing alcohol consumption. She gained ten pounds over the holiday period.  She is experiencing issues with her lower back and right leg. 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 Her lower back is described as 'so painful,' especially in the mornings; she reports that after a hot shower and moving around, she can function, though she still feels pain in her lower back. Physical activity exacerbates her pain, as noted after taking down Christmas decorations. She has a history of a Baker's cyst that exploded in her leg, contributing to her current symptoms. She has previously undergone epidural injections without relief.  She is on valsartan  for blood pressure, which was slightly elevated today at 138/78. She also takes Zetia  and rosuvastatin  for cholesterol management. She reports this is managed with cardiology and declines labs today.   She recently had issues obtaining Humira  due to insurance changes, which led to missing doses and potentially coming out of remission for her condition.  No shortness of breath, chest pain, dizziness, or swelling in her feet or ankles. She completed  physical therapy and plans to return to the gym, focusing on low-impact exercises like the sit-down elliptical and bike due to her leg and foot issues.  ROS negative except for what is listed in HPI. History, Medications, Surgery, SDOH, and Family History reviewed and updated as appropriate.  Objective:  Physical Exam Vitals and nursing note reviewed.  Constitutional:      Appearance: Normal appearance.  HENT:     Head: Normocephalic.  Eyes:     Pupils: Pupils are equal, round, and reactive to light.  Neck:     Vascular: No carotid bruit.  Cardiovascular:     Rate and Rhythm: Normal rate and regular rhythm.     Pulses: Normal pulses.     Heart sounds: Normal heart sounds.  Pulmonary:     Effort: Pulmonary effort is normal.     Breath sounds: Normal breath sounds.  Abdominal:     General: Bowel sounds are normal.     Palpations: Abdomen is soft.  Musculoskeletal:        General: Normal range of motion.     Cervical back: Normal range of motion.     Right lower leg: No edema.     Left lower leg: No edema.  Skin:    General: Skin is warm.     Capillary Refill: Capillary refill takes less than 2 seconds.  Neurological:     General: No focal deficit present.     Mental Status: She is alert and oriented to person, place, and time.     Sensory: Sensory deficit present.  Psychiatric:  Mood and Affect: Mood normal.         Assessment & Plan:   Assessment & Plan Class 2 severe obesity due to excess calories with serious comorbidity and body mass index (BMI) of 35.0 to 35.9 in adult BMI is 33.82 with a recent weight gain of 10 pounds over the holiday period. Previously on Zepbound , which was effective in curbing appetite and reducing alcohol consumption, but insurance no longer covers it. 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 - Continue current exercise regimen, including sit-down elliptical and weightlifting.    Primary hypertension Blood pressure  slightly elevated at 138/78. Currently on valsartan . No symptoms of shortness of breath, chest pain, or dizziness reported. - Monitor blood pressure at home a couple of days a week. - Follow up with cardiologist for blood pressure management.    Crohn's disease of small and large intestines with complication (HCC) Recent colonoscopy indicated possible loss of remission. Awaiting further evaluation and potential medication adjustment. - Follow up with gastroenterologist at the end of January for further evaluation and potential medication adjustment.    Neuropathic pain Continue with gabapentin  for management. Refills provided today.  Orders:   gabapentin  (NEURONTIN ) 300 MG capsule; Take 1 capsule (300 mg total) by mouth 3 (three) times daily.  Chronic bilateral low back pain with right-sided sciatica Chronic low back pain with right-sided sciatica and tibial nerve damage. Previous epidural injections were ineffective. Considering spinal infusion surgery or lumbar facet injections. Discussed potential benefits and recovery process of surgery with a friend who underwent the procedure. Considering injections first due to potential effectiveness and recovery concerns. - Contact referral coordinator to find a local provider for lumbar facet injections. - Consider spinal infusion surgery if injections are ineffective.      Smith Potenza E Adren Dollins, DNP, AGNP-c    "

## 2024-04-16 NOTE — Patient Instructions (Addendum)
 Let me know if the new insurance will cover the zepbound  and we can always restart this.    Keep working on diet and exercise management to help keep the weight loss off. You have done great so far!  Keep an eye on your blood pressures at home a few days a week and let me know if it is staying higher than 130/80 on average.   I will let you know if we can find someone in [place] or nearby that can do the injections rather than go down to [place].

## 2024-04-26 ENCOUNTER — Encounter: Payer: Self-pay | Admitting: Neurosurgery

## 2024-04-26 DIAGNOSIS — G8929 Other chronic pain: Secondary | ICD-10-CM

## 2024-04-27 ENCOUNTER — Other Ambulatory Visit (HOSPITAL_COMMUNITY): Payer: Self-pay

## 2024-04-27 NOTE — Telephone Encounter (Signed)
 Card scanned into system on 04-16-2024 looks to be for medical and dental, no pharmacy. I have patient [employer] on file, and tried running it, but it does say she has other insurance. Does she have a separate pharmacy card?

## 2024-04-28 ENCOUNTER — Telehealth: Payer: Self-pay

## 2024-04-28 ENCOUNTER — Other Ambulatory Visit (HOSPITAL_COMMUNITY): Payer: Self-pay

## 2024-04-28 NOTE — Telephone Encounter (Signed)
 PA request has been Submitted. New Encounter has been or will be created for follow up. For additional info see Pharmacy Prior Auth telephone encounter from 04-28-2024.

## 2024-04-28 NOTE — Telephone Encounter (Signed)
 Pharmacy Patient Advocate Encounter   Received notification from Patient Advice Request messages that prior authorization for Humira  (2 Pen) (CF) 40MG /0.4ML auto-injector kit is required/requested.   Insurance verification completed.   The patient is insured through [id number].   Per test claim: PA required; PA submitted to above mentioned insurance via Latent Key/confirmation #/EOC [id number] Status is pending

## 2024-05-04 ENCOUNTER — Other Ambulatory Visit: Payer: Self-pay

## 2024-05-04 MED ORDER — HUMIRA (2 PEN) 80 MG/0.8ML ~~LOC~~ AJKT: 80.0000 mg | AUTO-INJECTOR | SUBCUTANEOUS | 11 refills | Status: DC

## 2024-05-04 NOTE — Telephone Encounter (Signed)
 PT stated that the dosage is wrong for the Humira . She stated that it should be two pens with both being 80MG  totaling 160MG . Please review and advise.

## 2024-05-04 NOTE — Telephone Encounter (Signed)
Pt is correct

## 2024-05-05 NOTE — Telephone Encounter (Signed)
 Any update for this pts Humira ? New script was sent in yesterday as she take 80mg  every 14 days.

## 2024-05-06 ENCOUNTER — Telehealth: Payer: Self-pay

## 2024-05-06 NOTE — Telephone Encounter (Signed)
 Pharmacy Patient Advocate Encounter   Received notification from Pt Calls Messages that prior authorization for HUMIRA , 2 PEN, 80 MG/0.8ML pen  is required/requested.   Insurance verification completed.   The patient is insured through [id number].   Per test claim: PA required; PA submitted to above mentioned insurance via Fax Key/confirmation #/EOC [id number] Status is pending

## 2024-05-07 ENCOUNTER — Ambulatory Visit: Admitting: Gastroenterology

## 2024-05-07 NOTE — Telephone Encounter (Signed)
 Patient is requesting a call to discuss an update. States she is due to have medication tomorrow. Please advise, thank you

## 2024-05-10 ENCOUNTER — Other Ambulatory Visit (HOSPITAL_COMMUNITY): Payer: Self-pay

## 2024-05-10 NOTE — Telephone Encounter (Signed)
 I have contacted the insurance company and they state that they currently have the authorization request in the review status. I was able to get them to update this to an urgent request and rep Eleonore) informed me she put that note in and it will be 24-48 hours before a determination is made. I will update once I receive a determination through fax.

## 2024-05-12 NOTE — Telephone Encounter (Signed)
 PA team- Would you mind looking to see if a determination has been made regarding adalimumab  therapy through [facility]?

## 2024-05-13 ENCOUNTER — Telehealth: Payer: Self-pay | Admitting: Gastroenterology

## 2024-05-13 NOTE — Telephone Encounter (Signed)
 Inbound call from patient stating her insurance will not cover humira  medication and she is way overdue, patient would like to speak to nurse. Patient also stated they sent over an appeal for a new medication they will approve. Did not disclose the name of new medication. Please advise  Thank you

## 2024-05-13 NOTE — Telephone Encounter (Signed)
 Pod A APP (Dr Leigh out of office)  We have received a fax from [facility] indicating that they WILL approve Humira  biosimilar, Simlandi 1 pen kit, 80 mg/0.8 ml through 05/10/2025. Script must be sent to [facility] at fax [phone].  Patient has already missed Humira  dosing while awaiting insurance decision.   Can you confirm if biosimilar dosing Simlandi would be ok to provide patient?

## 2024-05-14 MED ORDER — SIMLANDI (1 PEN) 80 MG/0.8ML ~~LOC~~ AJKT: 1.0000 | AUTO-INJECTOR | SUBCUTANEOUS | 11 refills | Status: AC

## 2024-05-14 NOTE — Telephone Encounter (Signed)
 I have spoken to patient to advise that insurance has sent an approval to our office for Humira 's biosimilar, Simlandi  1 pen kit 80 mg/0.8 ml through 05/10/25 (PA ID [id number]). Advised that I have gotten provider clearance to send this biosimilar medication in and rx was sent electronically to [facility] as requested by [facility]. (Fax [phone]). Patient verbalizes understanding and states she has the phone number to [facility]. Advised that she may be able to call pharmacy to request expedited shipping since she has already missed Humira  dosing.

## 2024-05-14 NOTE — Addendum Note (Signed)
 Addended by: CLAUDENE NAOMIE SAILOR on: 05/14/2024 11:59 AM   Modules accepted: Orders

## 2024-06-17 ENCOUNTER — Ambulatory Visit: Admitting: Gastroenterology

## 2024-06-18 ENCOUNTER — Ambulatory Visit: Admitting: Emergency Medicine

## 2024-10-22 ENCOUNTER — Encounter: Admitting: Nurse Practitioner
